# Patient Record
Sex: Male | Born: 1955 | Race: White | Hispanic: No | Marital: Married | Smoking: Never smoker
Health system: Southern US, Community
[De-identification: ages and names within clinical notes are randomized; demographics above are authoritative.]

## PROBLEM LIST (undated history)

## (undated) DIAGNOSIS — T7840XA Allergy, unspecified, initial encounter: Secondary | ICD-10-CM

## (undated) DIAGNOSIS — I493 Ventricular premature depolarization: Secondary | ICD-10-CM

## (undated) DIAGNOSIS — E785 Hyperlipidemia, unspecified: Secondary | ICD-10-CM

## (undated) DIAGNOSIS — E7800 Pure hypercholesterolemia, unspecified: Secondary | ICD-10-CM

## (undated) DIAGNOSIS — G4730 Sleep apnea, unspecified: Secondary | ICD-10-CM

## (undated) DIAGNOSIS — E559 Vitamin D deficiency, unspecified: Secondary | ICD-10-CM

## (undated) DIAGNOSIS — I10 Essential (primary) hypertension: Secondary | ICD-10-CM

## (undated) DIAGNOSIS — K219 Gastro-esophageal reflux disease without esophagitis: Secondary | ICD-10-CM

## (undated) DIAGNOSIS — R011 Cardiac murmur, unspecified: Secondary | ICD-10-CM

## (undated) DIAGNOSIS — G629 Polyneuropathy, unspecified: Secondary | ICD-10-CM

## (undated) DIAGNOSIS — G4733 Obstructive sleep apnea (adult) (pediatric): Secondary | ICD-10-CM

## (undated) DIAGNOSIS — M79606 Pain in leg, unspecified: Secondary | ICD-10-CM

## (undated) DIAGNOSIS — G709 Myoneural disorder, unspecified: Secondary | ICD-10-CM

## (undated) DIAGNOSIS — M2550 Pain in unspecified joint: Secondary | ICD-10-CM

## (undated) DIAGNOSIS — R0602 Shortness of breath: Secondary | ICD-10-CM

## (undated) HISTORY — PX: TONSILLECTOMY: SUR1361

## (undated) HISTORY — DX: Cardiac murmur, unspecified: R01.1

## (undated) HISTORY — DX: Shortness of breath: R06.02

## (undated) HISTORY — DX: Hyperlipidemia, unspecified: E78.5

## (undated) HISTORY — DX: Essential (primary) hypertension: I10

## (undated) HISTORY — DX: Obstructive sleep apnea (adult) (pediatric): G47.33

## (undated) HISTORY — PX: VASECTOMY: SHX75

## (undated) HISTORY — DX: Sleep apnea, unspecified: G47.30

## (undated) HISTORY — DX: Ventricular premature depolarization: I49.3

## (undated) HISTORY — DX: Myoneural disorder, unspecified: G70.9

## (undated) HISTORY — DX: Polyneuropathy, unspecified: G62.9

## (undated) HISTORY — PX: OTHER SURGICAL HISTORY: SHX169

## (undated) HISTORY — DX: Pain in unspecified joint: M25.50

## (undated) HISTORY — DX: Gastro-esophageal reflux disease without esophagitis: K21.9

## (undated) HISTORY — DX: Pure hypercholesterolemia, unspecified: E78.00

## (undated) HISTORY — DX: Allergy, unspecified, initial encounter: T78.40XA

## (undated) HISTORY — DX: Vitamin D deficiency, unspecified: E55.9

## (undated) HISTORY — DX: Pain in leg, unspecified: M79.606

## (undated) HISTORY — PX: SPINE SURGERY: SHX786

## (undated) HISTORY — PX: ROTATOR CUFF REPAIR: SHX139

---

## 2012-03-20 ENCOUNTER — Telehealth: Payer: Self-pay

## 2012-03-20 NOTE — Telephone Encounter (Signed)
.  [facility] The patient requested return call from Dr. Cleta Alberts.  Please call patient at [phone].

## 2012-03-21 NOTE — Telephone Encounter (Signed)
Spoke with patient and he states he spoke with Dr. Cleta Alberts last pm.  Disregard message.

## 2012-04-04 ENCOUNTER — Ambulatory Visit: Payer: BC Managed Care – PPO | Admitting: Emergency Medicine

## 2012-04-04 VITALS — BP 119/73 | HR 75 | Temp 98.8°F | Resp 16 | Ht 70.18 in | Wt 220.0 lb

## 2012-04-04 DIAGNOSIS — J020 Streptococcal pharyngitis: Secondary | ICD-10-CM

## 2012-04-04 MED ORDER — ERYTHROMYCIN BASE 500 MG PO TABS: 500.0000 mg | ORAL_TABLET | Freq: Four times a day (QID) | ORAL | Status: AC

## 2012-04-04 NOTE — Progress Notes (Signed)
  Subjective:    Patient ID: Thomas Harding, male    DOB: April 06, 1956, 56 y.o.   MRN: [id number]  Sore Throat  This is a new problem. The current episode started yesterday. The problem has been unchanged. Neither side of throat is experiencing more pain than the other. The maximum temperature recorded prior to his arrival was 100 - 100.9 F. Associated symptoms include diarrhea. Pertinent negatives include no abdominal pain, congestion, coughing, drooling, ear pain, headaches, hoarse voice, plugged ear sensation, neck pain, shortness of breath, stridor, swollen glands, trouble swallowing or vomiting. He has tried nothing for the symptoms. The treatment provided no relief.  Diarrhea  Pertinent negatives include no abdominal pain, coughing, headaches or vomiting.      Review of Systems  Constitutional: Negative.   HENT: Negative for ear pain, congestion, hoarse voice, drooling, trouble swallowing and neck pain.   Eyes: Negative.   Respiratory: Negative for cough, shortness of breath and stridor.   Cardiovascular: Negative.   Gastrointestinal: Positive for diarrhea. Negative for vomiting and abdominal pain.  Genitourinary: Negative.   Musculoskeletal: Negative.   Neurological: Negative for headaches.       Objective:   Physical Exam  Constitutional: He is oriented to person, place, and time. He appears well-developed and well-nourished.  HENT:  Head: Normocephalic and atraumatic.  Mouth/Throat: Oropharyngeal exudate present.  Eyes: Conjunctivae are normal. Pupils are equal, round, and reactive to light.  Neck: Normal range of motion. Neck supple.  Cardiovascular: Normal rate and regular rhythm.   Pulmonary/Chest: Effort normal and breath sounds normal.  Abdominal: Soft.  Musculoskeletal: Normal range of motion.  Neurological: He is alert and oriented to person, place, and time.  Skin: Skin is warm and dry.          Assessment & Plan:  Strep Follow up as needed

## 2014-04-02 ENCOUNTER — Telehealth: Payer: Self-pay

## 2014-04-02 NOTE — Telephone Encounter (Signed)
Patient calling to get a prescription from [facility]. He meant Dr. Everlene Farrier. He says he believes he needs an antibiotic for an infected tooth. Please advise.   Best: [phone]

## 2014-04-03 NOTE — Telephone Encounter (Signed)
Patient called because of an abscessed tooth. He is allergic to penicillin we'll treat with clindamycin 300 mg 3 times a day until he can get into see the dentist next week. Prescription called to [facility] at [facility].

## 2014-04-04 NOTE — Telephone Encounter (Signed)
Spoke to patient and advised him that his rx was called into [facility] at [address].  Pt verbalized understanding.

## 2016-08-05 ENCOUNTER — Ambulatory Visit (INDEPENDENT_AMBULATORY_CARE_PROVIDER_SITE_OTHER): Payer: BLUE CROSS/BLUE SHIELD

## 2016-08-05 ENCOUNTER — Ambulatory Visit (INDEPENDENT_AMBULATORY_CARE_PROVIDER_SITE_OTHER): Payer: BLUE CROSS/BLUE SHIELD | Admitting: Family Medicine

## 2016-08-05 ENCOUNTER — Encounter: Payer: Self-pay | Admitting: Family Medicine

## 2016-08-05 VITALS — BP 130/80 | HR 78 | Temp 98.2°F | Resp 16 | Ht 71.0 in | Wt 225.0 lb

## 2016-08-05 DIAGNOSIS — R0789 Other chest pain: Secondary | ICD-10-CM | POA: Diagnosis not present

## 2016-08-05 DIAGNOSIS — Z131 Encounter for screening for diabetes mellitus: Secondary | ICD-10-CM | POA: Diagnosis not present

## 2016-08-05 DIAGNOSIS — Z13220 Encounter for screening for lipoid disorders: Secondary | ICD-10-CM | POA: Diagnosis not present

## 2016-08-05 DIAGNOSIS — Z8249 Family history of ischemic heart disease and other diseases of the circulatory system: Secondary | ICD-10-CM | POA: Diagnosis not present

## 2016-08-05 DIAGNOSIS — Z1329 Encounter for screening for other suspected endocrine disorder: Secondary | ICD-10-CM

## 2016-08-05 DIAGNOSIS — R0609 Other forms of dyspnea: Secondary | ICD-10-CM | POA: Diagnosis not present

## 2016-08-05 DIAGNOSIS — R0600 Dyspnea, unspecified: Secondary | ICD-10-CM

## 2016-08-05 LAB — POCT CBC
Granulocyte percent: 67.6 %G (ref 37–80)
HCT, POC: 41.8 % — AB (ref 43.5–53.7)
Hemoglobin: 14.9 g/dL (ref 14.1–18.1)
Lymph, poc: 2.1 (ref 0.6–3.4)
MCH, POC: 29.8 pg (ref 27–31.2)
MCHC: 35.7 g/dL — AB (ref 31.8–35.4)
MCV: 83.4 fL (ref 80–97)
MID (cbc): 0.3 (ref 0–0.9)
MPV: 7.9 fL (ref 0–99.8)
POC Granulocyte: 5 (ref 2–6.9)
POC LYMPH PERCENT: 29 %L (ref 10–50)
POC MID %: 3.4 %M (ref 0–12)
Platelet Count, POC: 232 10*3/uL (ref 142–424)
RBC: 5.01 M/uL (ref 4.69–6.13)
RDW, POC: 12.4 %
WBC: 7.4 10*3/uL (ref 4.6–10.2)

## 2016-08-05 NOTE — Patient Instructions (Addendum)
Your blood tests, EKG, and chest x-ray overall looked okay here in the office. I will send off some other blood work to look at thyroid and blood clot tests, although you do not appear to have any significant risk factors at this time.   However based on your symptoms, would recommend you be seen and evaluated by cardiology for possible underlying heart disease. In the meantime, start aspirin 81 mg each day, avoid any exertional activity activity that had caused chest tightness or shortness of breath is in the past. If any further chest pain or worsening chest pain, go to emergency room or call 911. I will refer you to cardiologist.     Nonspecific Chest Pain  Chest pain can be caused by many different conditions. There is always a chance that your pain could be related to something serious, such as a heart attack or a blood clot in your lungs. Chest pain can also be caused by conditions that are not life-threatening. If you have chest pain, it is very important to follow up with your health care provider. CAUSES  Chest pain can be caused by:  Heartburn.  Pneumonia or bronchitis.  Anxiety or stress.  Inflammation around your heart (pericarditis) or lung (pleuritis or pleurisy).  A blood clot in your lung.  A collapsed lung (pneumothorax). It can develop suddenly on its own (spontaneous pneumothorax) or from trauma to the chest.  Shingles infection (varicella-zoster virus).  Heart attack.  Damage to the bones, muscles, and cartilage that make up your chest wall. This can include:  Bruised bones due to injury.  Strained muscles or cartilage due to frequent or repeated coughing or overwork.  Fracture to one or more ribs.  Sore cartilage due to inflammation (costochondritis). RISK FACTORS  Risk factors for chest pain may include:  Activities that increase your risk for trauma or injury to your chest.  Respiratory infections or conditions that cause frequent coughing.  Medical  conditions or overeating that can cause heartburn.  Heart disease or family history of heart disease.  Conditions or health behaviors that increase your risk of developing a blood clot.  Having had chicken pox (varicella zoster). SIGNS AND SYMPTOMS Chest pain can feel like:  Burning or tingling on the surface of your chest or deep in your chest.  Crushing, pressure, aching, or squeezing pain.  Dull or sharp pain that is worse when you move, cough, or take a deep breath.  Pain that is also felt in your back, neck, shoulder, or arm, or pain that spreads to any of these areas. Your chest pain may come and go, or it may stay constant. DIAGNOSIS Lab tests or other studies may be needed to find the cause of your pain. Your health care provider may have you take a test called an ambulatory ECG (electrocardiogram). An ECG records your heartbeat patterns at the time the test is performed. You may also have other tests, such as:  Transthoracic echocardiogram (TTE). During echocardiography, sound waves are used to create a picture of all of the heart structures and to look at how blood flows through your heart.  Transesophageal echocardiogram (TEE).This is a more advanced imaging test that obtains images from inside your body. It allows your health care provider to see your heart in finer detail.  Cardiac monitoring. This allows your health care provider to monitor your heart rate and rhythm in real time.  Holter monitor. This is a portable device that records your heartbeat and can help to  diagnose abnormal heartbeats. It allows your health care provider to track your heart activity for several days, if needed.  Stress tests. These can be done through exercise or by taking medicine that makes your heart beat more quickly.  Blood tests.  Imaging tests. TREATMENT  Your treatment depends on what is causing your chest pain. Treatment may include:  Medicines. These may include:  Acid  blockers for heartburn.  Anti-inflammatory medicine.  Pain medicine for inflammatory conditions.  Antibiotic medicine, if an infection is present.  Medicines to dissolve blood clots.  Medicines to treat coronary artery disease.  Supportive care for conditions that do not require medicines. This may include:  Resting.  Applying heat or cold packs to injured areas.  Limiting activities until pain decreases. HOME CARE INSTRUCTIONS  If you were prescribed an antibiotic medicine, finish it all even if you start to feel better.  Avoid any activities that bring on chest pain.  Do not use any tobacco products, including cigarettes, chewing tobacco, or electronic cigarettes. If you need help quitting, ask your health care provider.  Do not drink alcohol.  Take medicines only as directed by your health care provider.  Keep all follow-up visits as directed by your health care provider. This is important. This includes any further testing if your chest pain does not go away.  If heartburn is the cause for your chest pain, you may be told to keep your head raised (elevated) while sleeping. This reduces the chance that acid will go from your stomach into your esophagus.  Make lifestyle changes as directed by your health care provider. These may include:  Getting regular exercise. Ask your health care provider to suggest some activities that are safe for you.  Eating a heart-healthy diet. A registered dietitian can help you to learn healthy eating options.  Maintaining a healthy weight.  Managing diabetes, if necessary.  Reducing stress. SEEK MEDICAL CARE IF:  Your chest pain does not go away after treatment.  You have a rash with blisters on your chest.  You have a fever. SEEK IMMEDIATE MEDICAL CARE IF:   Your chest pain is worse.  You have an increasing cough, or you cough up blood.  You have severe abdominal pain.  You have severe weakness.  You faint.  You have  chills.  You have sudden, unexplained chest discomfort.  You have sudden, unexplained discomfort in your arms, back, neck, or jaw.  You have shortness of breath at any time.  You suddenly start to sweat, or your skin gets clammy.  You feel nauseous or you vomit.  You suddenly feel light-headed or dizzy.  Your heart begins to beat quickly, or it feels like it is skipping beats. These symptoms may represent a serious problem that is an emergency. Do not wait to see if the symptoms will go away. Get medical help right away. Call your local emergency services (911 in the [place] Do not drive yourself to the hospital.   This information is not intended to replace advice given to you by your health care provider. Make sure you discuss any questions you have with your health care provider.   Document Released: 07/10/2005 Document Revised: 10/21/2014 Document Reviewed: 05/06/2014 Elsevier Interactive Patient Education 2016 [employer].    IF you received an x-ray today, you will receive an invoice from [facility]. Please contact [facility] at [phone] with questions or concerns regarding your invoice.   IF you received labwork today, you will receive an invoice  from [employer]. Please contact [facility] at [phone] with questions or concerns regarding your invoice.   Our billing staff will not be able to assist you with questions regarding bills from these companies.  You will be contacted with the lab results as soon as they are available. The fastest way to get your results is to activate your My Chart account. Instructions are located on the last page of this paperwork. If you have not heard from [place] regarding the results in 2 weeks, please contact this office.

## 2016-08-05 NOTE — Progress Notes (Signed)
Subjective:  By signing my name below, I, Moises Blood, attest that this documentation has been prepared under the direction and in the presence of Merri Ray, MD. Electronically Signed: Moises Blood, D[place]. 08/05/2016 , 2:51 PM .  Patient was seen in Room 13 .   Patient ID: Thomas Harding, male    DOB: 1956/01/26, 60 y.o.   MRN: [id number] Chief Complaint  Patient presents with  . Shortness of Breath    x 2 weeks, pt. says when walking up hill and bending down   . Neck Pain    x 2 weeks, pt say usually in morning   HPI Thomas Harding is a 60 y.o. male Here for 2 separate concerns: 2 weeks of shortness of breath and 2 weeks of neck pain. He had peanut butter pretzels about an hour prior to office visit. He works in Merchandiser, retail.   Shortness of breath He's been bending down and getting up for work and feeling a little short of breath with chest tightness. Over the past weekend, he noticed more shortness of breath when walking up a hill while walking his dogs. His short of breath improve with rest. His chest tightness improve as the day goes on. He denies any unexpected weight change. He denies illicit drug use. He denies using an inhaler or a puffer for improvement. He denies heart problems or heart attack in the past. He denies having stress test done.   Family history He notes this father having high cholesterol, around age of 39~59 years old.  His paternal grandfather died of heart attack in his early 64s. His uncles have heart problems.   He has 2 younger brothers, and they're both healthy.   Leg Pain He also complains having intermittent left upper leg pain. He denies any calf pain. He denies any recent long distance travel. He also notes numbness in his right foot, but this has been ongoing for a long time.   Neck pain He also notes having neck tightness with occasional throbbing pain.   There are no active problems to display for this patient.  No past  medical history on file. No past surgical history on file. Allergies  Allergen Reactions  . Bee Venom Anaphylaxis  . Penicillins Hives and Swelling   Prior to Admission medications   Not on File   Social History   Social History  . Marital status: Single    Spouse name: N/A  . Number of children: N/A  . Years of education: N/A   Occupational History  . Not on file.   Social History Main Topics  . Smoking status: Never Smoker  . Smokeless tobacco: Never Used  . Alcohol use Not on file  . Drug use: No  . Sexual activity: Not on file   Other Topics Concern  . Not on file   Social History Narrative  . No narrative on file   Review of Systems  Constitutional: Negative for activity change and unexpected weight change.  Respiratory: Positive for chest tightness and shortness of breath. Negative for cough and wheezing.   Cardiovascular: Negative for chest pain and leg swelling.  Musculoskeletal: Positive for myalgias, neck pain and neck stiffness. Negative for joint swelling.  Neurological: Positive for numbness. Negative for dizziness and headaches.       Objective:   Physical Exam  Constitutional: He is oriented to person, place, and time. He appears well-developed and well-nourished. No distress.  HENT:  Head: Normocephalic and atraumatic.  Eyes: EOM are normal. Pupils are equal, round, and reactive to light.  Neck: Neck supple.  C-spine: Limited rotation and extension  Cardiovascular: Normal rate.   Pulmonary/Chest: Effort normal. No respiratory distress.  Musculoskeletal: Normal range of motion.  Full strength and grip strength in upper extremities 5/5  Neurological: He is alert and oriented to person, place, and time.  Reflex Scores:      Tricep reflexes are 1+ on the right side and 1+ on the left side.      Bicep reflexes are 2+ on the right side and 2+ on the left side.      Brachioradialis reflexes are 2+ on the right side and 2+ on the left side. Skin:  Skin is warm and dry.  Psychiatric: He has a normal mood and affect. His behavior is normal.  Nursing note and vitals reviewed.   Vitals:   08/05/16 1419  BP: 130/80  Pulse: 78  Resp: 16  Temp: 98.2 F (36.8 C)  TempSrc: Oral  SpO2: 97%  Weight: 225 lb (102.1 kg)  Height: 5\' 11"  (1.803 m)   Results for orders placed or performed in visit on 08/05/16  POCT CBC  Result Value Ref Range   WBC 7.4 4.6 - 10.2 K/uL   Lymph, poc 2.1 0.6 - 3.4   POC LYMPH PERCENT 29.0 10 - 50 %L   MID (cbc) 0.3 0 - 0.9   POC MID % 3.4 0 - 12 %M   POC Granulocyte 5.0 2 - 6.9   Granulocyte percent 67.6 37 - 80 %G   RBC 5.01 4.69 - 6.13 M/uL   Hemoglobin 14.9 14.1 - 18.1 g/dL   HCT, POC 41.8 (A) 43.5 - 53.7 %   MCV 83.4 80 - 97 fL   MCH, POC 29.8 27 - 31.2 pg   MCHC 35.7 (A) 31.8 - 35.4 g/dL   RDW, POC 12.4 %   Platelet Count, POC 232 142 - 424 K/uL   MPV 7.9 0 - 99.8 fL   EKG: sinus rhythm, no acute findings Results for orders placed or performed in visit on 08/05/16  COMPLETE METABOLIC PANEL WITH GFR  Result Value Ref Range   Sodium 140 135 - 146 mmol/L   Potassium 4.2 3.5 - 5.3 mmol/L   Chloride 102 98 - 110 mmol/L   CO2 30 20 - 31 mmol/L   Glucose, Bld 89 65 - 99 mg/dL   BUN 14 7 - 25 mg/dL   Creat 1.18 0.70 - 1.33 mg/dL   Total Bilirubin 0.8 0.2 - 1.2 mg/dL   Alkaline Phosphatase 73 40 - 115 U/L   AST 21 10 - 35 U/L   ALT 20 9 - 46 U/L   Total Protein 7.0 6.1 - 8.1 g/dL   Albumin 4.1 3.6 - 5.1 g/dL   Calcium 9.6 8.6 - 10.3 mg/dL   GFR, Est African American 78 >=60 mL/min   GFR, Est Non African American 67 >=60 mL/min  Lipid panel  Result Value Ref Range   Cholesterol 183 125 - 200 mg/dL   Triglycerides 308 (H) <150 mg/dL   HDL 31 (L) >=40 mg/dL   Total CHOL/HDL Ratio 5.9 (H) <=5.0 Ratio   VLDL 62 (H) <30 mg/dL   LDL Cholesterol 90 <130 mg/dL  TSH  Result Value Ref Range   TSH 2.30 0.40 - 4.50 mIU/L  D-dimer, quantitative (not at [facility])  Result Value Ref Range   D-Dimer,  Quant 0.36 <0.50 mcg/mL FEU  POCT CBC  Result Value Ref Range   WBC 7.4 4.6 - 10.2 K/uL   Lymph, poc 2.1 0.6 - 3.4   POC LYMPH PERCENT 29.0 10 - 50 %L   MID (cbc) 0.3 0 - 0.9   POC MID % 3.4 0 - 12 %M   POC Granulocyte 5.0 2 - 6.9   Granulocyte percent 67.6 37 - 80 %G   RBC 5.01 4.69 - 6.13 M/uL   Hemoglobin 14.9 14.1 - 18.1 g/dL   HCT, POC 41.8 (A) 43.5 - 53.7 %   MCV 83.4 80 - 97 fL   MCH, POC 29.8 27 - 31.2 pg   MCHC 35.7 (A) 31.8 - 35.4 g/dL   RDW, POC 12.4 %   Platelet Count, POC 232 142 - 424 K/uL   MPV 7.9 0 - 99.8 fL       Assessment & Plan:    ARWIN HAAG is a 60 y.o. male Dyspnea on exertion - Plan: DG Chest 2 View, EKG 12-Lead, POCT CBC, D-dimer, quantitative (not at [facility]), Ambulatory referral to Cardiology Family history of early CAD - Plan: DG Chest 2 View, EKG 12-Lead Chest tightness - Plan: DG Chest 2 View, EKG 12-Lead, TSH, D-dimer, quantitative (not at [facility]), Ambulatory referral to Cardiology   -Dyspnea on exertion with episodic chest tightness. No concerning findings on EKG or chest x-ray in office, but also asymptomatic currently. With family history of coronary disease, will refer to cardiology for possible stress testing and echo as angina possible with his symptoms above. For now start aspirin 81 mg daily, and avoid exertional activity until he has been cleared to do so by cardiology. ER/911 chest pain precautions were reviewed. D-dimer was obtained that was negative, no known symptoms or risk factors for DVT/PE.  Screening for diabetes mellitus - Plan: COMPLETE METABOLIC PANEL WITH GFR  Screening for hyperlipidemia - Plan: Lipid panel to risk stratify.   Screening for thyroid disorder - Plan: TSH   No orders of the defined types were placed in this encounter.  Patient Instructions   Your blood tests, EKG, and chest x-ray overall looked okay here in the office. I will send off some other blood work to look at thyroid and blood clot tests, although  you do not appear to have any significant risk factors at this time.   However based on your symptoms, would recommend you be seen and evaluated by cardiology for possible underlying heart disease. In the meantime, start aspirin 81 mg each day, avoid any exertional activity activity that had caused chest tightness or shortness of breath is in the past. If any further chest pain or worsening chest pain, go to emergency room or call 911. I will refer you to cardiologist.     Nonspecific Chest Pain  Chest pain can be caused by many different conditions. There is always a chance that your pain could be related to something serious, such as a heart attack or a blood clot in your lungs. Chest pain can also be caused by conditions that are not life-threatening. If you have chest pain, it is very important to follow up with your health care provider. CAUSES  Chest pain can be caused by:  Heartburn.  Pneumonia or bronchitis.  Anxiety or stress.  Inflammation around your heart (pericarditis) or lung (pleuritis or pleurisy).  A blood clot in your lung.  A collapsed lung (pneumothorax). It can develop suddenly on its own (spontaneous pneumothorax) or from trauma to the chest.  Shingles infection (varicella-zoster  virus).  Heart attack.  Damage to the bones, muscles, and cartilage that make up your chest wall. This can include:  Bruised bones due to injury.  Strained muscles or cartilage due to frequent or repeated coughing or overwork.  Fracture to one or more ribs.  Sore cartilage due to inflammation (costochondritis). RISK FACTORS  Risk factors for chest pain may include:  Activities that increase your risk for trauma or injury to your chest.  Respiratory infections or conditions that cause frequent coughing.  Medical conditions or overeating that can cause heartburn.  Heart disease or family history of heart disease.  Conditions or health behaviors that increase your risk of  developing a blood clot.  Having had chicken pox (varicella zoster). SIGNS AND SYMPTOMS Chest pain can feel like:  Burning or tingling on the surface of your chest or deep in your chest.  Crushing, pressure, aching, or squeezing pain.  Dull or sharp pain that is worse when you move, cough, or take a deep breath.  Pain that is also felt in your back, neck, shoulder, or arm, or pain that spreads to any of these areas. Your chest pain may come and go, or it may stay constant. DIAGNOSIS Lab tests or other studies may be needed to find the cause of your pain. Your health care provider may have you take a test called an ambulatory ECG (electrocardiogram). An ECG records your heartbeat patterns at the time the test is performed. You may also have other tests, such as:  Transthoracic echocardiogram (TTE). During echocardiography, sound waves are used to create a picture of all of the heart structures and to look at how blood flows through your heart.  Transesophageal echocardiogram (TEE).This is a more advanced imaging test that obtains images from inside your body. It allows your health care provider to see your heart in finer detail.  Cardiac monitoring. This allows your health care provider to monitor your heart rate and rhythm in real time.  Holter monitor. This is a portable device that records your heartbeat and can help to diagnose abnormal heartbeats. It allows your health care provider to track your heart activity for several days, if needed.  Stress tests. These can be done through exercise or by taking medicine that makes your heart beat more quickly.  Blood tests.  Imaging tests. TREATMENT  Your treatment depends on what is causing your chest pain. Treatment may include:  Medicines. These may include:  Acid blockers for heartburn.  Anti-inflammatory medicine.  Pain medicine for inflammatory conditions.  Antibiotic medicine, if an infection is present.  Medicines to  dissolve blood clots.  Medicines to treat coronary artery disease.  Supportive care for conditions that do not require medicines. This may include:  Resting.  Applying heat or cold packs to injured areas.  Limiting activities until pain decreases. HOME CARE INSTRUCTIONS  If you were prescribed an antibiotic medicine, finish it all even if you start to feel better.  Avoid any activities that bring on chest pain.  Do not use any tobacco products, including cigarettes, chewing tobacco, or electronic cigarettes. If you need help quitting, ask your health care provider.  Do not drink alcohol.  Take medicines only as directed by your health care provider.  Keep all follow-up visits as directed by your health care provider. This is important. This includes any further testing if your chest pain does not go away.  If heartburn is the cause for your chest pain, you may be told to keep your  head raised (elevated) while sleeping. This reduces the chance that acid will go from your stomach into your esophagus.  Make lifestyle changes as directed by your health care provider. These may include:  Getting regular exercise. Ask your health care provider to suggest some activities that are safe for you.  Eating a heart-healthy diet. A registered dietitian can help you to learn healthy eating options.  Maintaining a healthy weight.  Managing diabetes, if necessary.  Reducing stress. SEEK MEDICAL CARE IF:  Your chest pain does not go away after treatment.  You have a rash with blisters on your chest.  You have a fever. SEEK IMMEDIATE MEDICAL CARE IF:   Your chest pain is worse.  You have an increasing cough, or you cough up blood.  You have severe abdominal pain.  You have severe weakness.  You faint.  You have chills.  You have sudden, unexplained chest discomfort.  You have sudden, unexplained discomfort in your arms, back, neck, or jaw.  You have shortness of breath at  any time.  You suddenly start to sweat, or your skin gets clammy.  You feel nauseous or you vomit.  You suddenly feel light-headed or dizzy.  Your heart begins to beat quickly, or it feels like it is skipping beats. These symptoms may represent a serious problem that is an emergency. Do not wait to see if the symptoms will go away. Get medical help right away. Call your local emergency services (911 in the [place] Do not drive yourself to the hospital.   This information is not intended to replace advice given to you by your health care provider. Make sure you discuss any questions you have with your health care provider.   Document Released: 07/10/2005 Document Revised: 10/21/2014 Document Reviewed: 05/06/2014 Elsevier Interactive Patient Education 2016 [employer].    IF you received an x-ray today, you will receive an invoice from [facility]. Please contact [facility] at [phone] with questions or concerns regarding your invoice.   IF you received labwork today, you will receive an invoice from [employer]. Please contact [facility] at [phone] with questions or concerns regarding your invoice.   Our billing staff will not be able to assist you with questions regarding bills from these companies.  You will be contacted with the lab results as soon as they are available. The fastest way to get your results is to activate your My Chart account. Instructions are located on the last page of this paperwork. If you have not heard from [place] regarding the results in 2 weeks, please contact this office.        I personally performed the services described in this documentation, which was scribed in my presence. The recorded information has been reviewed and considered, and addended by me as needed.   Signed,   Merri Ray, MD [facility].  08/06/16 8:23 AM

## 2016-08-06 LAB — COMPLETE METABOLIC PANEL WITH GFR
ALT: 20 U/L (ref 9–46)
AST: 21 U/L (ref 10–35)
Albumin: 4.1 g/dL (ref 3.6–5.1)
Alkaline Phosphatase: 73 U/L (ref 40–115)
BUN: 14 mg/dL (ref 7–25)
CO2: 30 mmol/L (ref 20–31)
Calcium: 9.6 mg/dL (ref 8.6–10.3)
Chloride: 102 mmol/L (ref 98–110)
Creat: 1.18 mg/dL (ref 0.70–1.33)
GFR, Est African American: 78 mL/min (ref >=60)
GFR, Est Non African American: 67 mL/min (ref >=60)
Glucose, Bld: 89 mg/dL (ref 65–99)
Potassium: 4.2 mmol/L (ref 3.5–5.3)
Sodium: 140 mmol/L (ref 135–146)
Total Bilirubin: 0.8 mg/dL (ref 0.2–1.2)
Total Protein: 7 g/dL (ref 6.1–8.1)

## 2016-08-06 LAB — D-DIMER, QUANTITATIVE: D-Dimer, Quant: 0.36 mcg/mL FEU (ref <0.50)

## 2016-08-06 LAB — LIPID PANEL
Cholesterol: 183 mg/dL (ref 125–200)
HDL: 31 mg/dL — ABNORMAL LOW (ref >=40)
LDL Cholesterol: 90 mg/dL (ref <130)
Total CHOL/HDL Ratio: 5.9 Ratio — ABNORMAL HIGH (ref <=5.0)
Triglycerides: 308 mg/dL — ABNORMAL HIGH (ref <150)
VLDL: 62 mg/dL — ABNORMAL HIGH (ref <30)

## 2016-08-06 LAB — TSH: TSH: 2.3 mIU/L (ref 0.40–4.50)

## 2016-08-19 DIAGNOSIS — E782 Mixed hyperlipidemia: Secondary | ICD-10-CM | POA: Diagnosis not present

## 2016-08-19 DIAGNOSIS — R0602 Shortness of breath: Secondary | ICD-10-CM | POA: Diagnosis not present

## 2016-09-02 DIAGNOSIS — R0789 Other chest pain: Secondary | ICD-10-CM | POA: Diagnosis not present

## 2016-09-02 DIAGNOSIS — R0602 Shortness of breath: Secondary | ICD-10-CM | POA: Diagnosis not present

## 2016-09-12 ENCOUNTER — Encounter: Payer: Self-pay | Admitting: Family Medicine

## 2016-09-12 DIAGNOSIS — R0602 Shortness of breath: Secondary | ICD-10-CM | POA: Diagnosis not present

## 2016-09-12 DIAGNOSIS — R0789 Other chest pain: Secondary | ICD-10-CM | POA: Diagnosis not present

## 2016-10-23 DIAGNOSIS — E782 Mixed hyperlipidemia: Secondary | ICD-10-CM | POA: Diagnosis not present

## 2016-10-28 DIAGNOSIS — E782 Mixed hyperlipidemia: Secondary | ICD-10-CM | POA: Diagnosis not present

## 2016-10-28 DIAGNOSIS — R0602 Shortness of breath: Secondary | ICD-10-CM | POA: Diagnosis not present

## 2016-11-22 DIAGNOSIS — R0600 Dyspnea, unspecified: Secondary | ICD-10-CM | POA: Insufficient documentation

## 2016-11-22 DIAGNOSIS — R0602 Shortness of breath: Secondary | ICD-10-CM

## 2016-11-22 DIAGNOSIS — R0609 Other forms of dyspnea: Secondary | ICD-10-CM | POA: Insufficient documentation

## 2016-12-16 DIAGNOSIS — D179 Benign lipomatous neoplasm, unspecified: Secondary | ICD-10-CM | POA: Diagnosis not present

## 2016-12-16 DIAGNOSIS — L570 Actinic keratosis: Secondary | ICD-10-CM | POA: Diagnosis not present

## 2016-12-16 DIAGNOSIS — D229 Melanocytic nevi, unspecified: Secondary | ICD-10-CM | POA: Diagnosis not present

## 2016-12-16 DIAGNOSIS — D044 Carcinoma in situ of skin of scalp and neck: Secondary | ICD-10-CM | POA: Diagnosis not present

## 2017-01-16 DIAGNOSIS — D044 Carcinoma in situ of skin of scalp and neck: Secondary | ICD-10-CM | POA: Diagnosis not present

## 2017-01-16 DIAGNOSIS — C4492 Squamous cell carcinoma of skin, unspecified: Secondary | ICD-10-CM

## 2017-01-16 HISTORY — DX: Squamous cell carcinoma of skin, unspecified: C44.92

## 2017-03-15 ENCOUNTER — Ambulatory Visit (INDEPENDENT_AMBULATORY_CARE_PROVIDER_SITE_OTHER): Payer: BLUE CROSS/BLUE SHIELD | Admitting: Family Medicine

## 2017-03-15 ENCOUNTER — Encounter: Payer: Self-pay | Admitting: Family Medicine

## 2017-03-15 VITALS — BP 126/76 | HR 80 | Temp 98.6°F | Resp 16 | Ht 70.25 in | Wt 221.2 lb

## 2017-03-15 DIAGNOSIS — L03114 Cellulitis of left upper limb: Secondary | ICD-10-CM

## 2017-03-15 DIAGNOSIS — E7800 Pure hypercholesterolemia, unspecified: Secondary | ICD-10-CM

## 2017-03-15 DIAGNOSIS — M25522 Pain in left elbow: Secondary | ICD-10-CM

## 2017-03-15 DIAGNOSIS — Z23 Encounter for immunization: Secondary | ICD-10-CM | POA: Diagnosis not present

## 2017-03-15 DIAGNOSIS — S50312A Abrasion of left elbow, initial encounter: Secondary | ICD-10-CM

## 2017-03-15 LAB — POCT CBC
Granulocyte percent: 77 %G (ref 37–80)
HCT, POC: 40.9 % — AB (ref 43.5–53.7)
Hemoglobin: 14.5 g/dL (ref 14.1–18.1)
Lymph, poc: 2.1 (ref 0.6–3.4)
MCH, POC: 29.6 pg (ref 27–31.2)
MCHC: 35.4 g/dL (ref 31.8–35.4)
MCV: 83.4 fL (ref 80–97)
MID (cbc): 0.3 (ref 0–0.9)
MPV: 8.3 fL (ref 0–99.8)
POC Granulocyte: 8 — AB (ref 2–6.9)
POC LYMPH PERCENT: 20.4 %L (ref 10–50)
POC MID %: 2.6 %M (ref 0–12)
Platelet Count, POC: 203 10*3/uL (ref 142–424)
RBC: 4.9 M/uL (ref 4.69–6.13)
RDW, POC: 12.4 %
WBC: 10.4 10*3/uL — AB (ref 4.6–10.2)

## 2017-03-15 MED ORDER — SULFAMETHOXAZOLE-TRIMETHOPRIM 800-160 MG PO TABS
2.0000 | ORAL_TABLET | Freq: Two times a day (BID) | ORAL | 0 refills | Status: DC
Start: 2017-03-15 — End: 2018-06-08

## 2017-03-15 MED ORDER — ATORVASTATIN CALCIUM 10 MG PO TABS: 10.0000 mg | ORAL_TABLET | Freq: Every day | ORAL | 0 refills | Status: DC

## 2017-03-15 MED ORDER — LEVOFLOXACIN 500 MG PO TABS: 500.0000 mg | ORAL_TABLET | Freq: Every day | ORAL | 0 refills | Status: DC

## 2017-03-15 NOTE — Progress Notes (Signed)
Subjective:    Patient ID: Thomas Harding, male    DOB: [birth year], 61 y.o.   MRN: [id number]  03/15/2017  Elbow Pain (left, blister while working ) and Hyperlipidemia (recheck )   HPI This 61 y.o. male presents for evaluation of LEFT ELBOW PAIN.  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  Rubbed elbow on flooring all day long and suffered a superficial scrape along L ELBOW.  One week ago, got a blister on L elbow and L knee. Yesterday, coming home and flexed arm and hurt; also hurt to lay arm on arm rest.  No fever.  Not sure when developed redness.  +Swelling and warmth at L elbow and forearm.  Denies numbness and tingling in forearm or fingers.  No drainage.  Not sure when last Tetanus occurred. Son is a Software engineer.    Also requesting refill of Atorvastatin.  Started by Dr. Einar Gip based on strong family history of coronary artery disease.  Nonfasting today.  Having some muscle soreness with medication.   Review of Systems  Constitutional: Negative for activity change, appetite change, chills, diaphoresis, fatigue and fever.  Eyes: Negative for visual disturbance.  Respiratory: Negative for cough and shortness of breath.   Cardiovascular: Negative for chest pain, palpitations and leg swelling.  Endocrine: Negative for cold intolerance, heat intolerance, polydipsia, polyphagia and polyuria.  Musculoskeletal: Positive for joint swelling.  Skin: Positive for color change and wound. Negative for pallor and rash.  Neurological: Negative for dizziness, tremors, seizures, syncope, facial asymmetry, speech difficulty, weakness, light-headedness, numbness and headaches.    Past Medical History:  Diagnosis Date  . Hyperlipidemia    History reviewed. No pertinent surgical history. Allergies  Allergen Reactions  . Bee Venom Anaphylaxis  . Penicillins Hives and Swelling    Social History   Social History  . Marital status: Single    Spouse name:  N/A  . Number of children: N/A  . Years of education: N/A   Occupational History  . Not on file.   Social History Main Topics  . Smoking status: Never Smoker  . Smokeless tobacco: Never Used  . Alcohol use Yes     Comment: 2 per month  . Drug use: No  . Sexual activity: Yes   Other Topics Concern  . Not on file   Social History Narrative  . No narrative on file   History reviewed. No pertinent family history.     Objective:    BP 126/76   Pulse 80   Temp 98.6 F (37 C) (Oral)   Resp 16   Ht 5' 10.25" (1.784 m)   Wt 221 lb 4 oz (100.4 kg)   SpO2 97%   BMI 31.52 kg/m  Physical Exam  Constitutional: He is oriented to person, place, and time. He appears well-developed and well-nourished. No distress.  HENT:  Head: Normocephalic and atraumatic.  Eyes: Conjunctivae and EOM are normal. Pupils are equal, round, and reactive to light.  Neck: Normal range of motion. Neck supple. Carotid bruit is not present.  Cardiovascular: Normal rate, regular rhythm, normal heart sounds and intact distal pulses.  Exam reveals no gallop and no friction rub.   No murmur heard. Pulmonary/Chest: Effort normal and breath sounds normal. He has no wheezes. He has no rales.  Musculoskeletal:       Left elbow: He exhibits swelling. He exhibits normal range of motion, no effusion, no deformity and no laceration. No tenderness found. No radial  head, no medial epicondyle, no lateral epicondyle and no olecranon process tenderness noted.       Left wrist: Normal. He exhibits normal range of motion, no tenderness and no bony tenderness.       Left forearm: He exhibits tenderness and swelling. He exhibits no bony tenderness, no deformity and no laceration.       Arms: +1 cm eschar distal to olecranon region of L ELBOW; no fluctuance; no streaking; +large area of surrounding erythema 13cm x 13 cm. +swelling along olecranon region yet non-tender at bursa location.  Full extension and flexion of elbow with  minimal pain; normal pronation and supination of L forearm.  Full ROM L wrist without pain.   Neurological: He is alert and oriented to person, place, and time. No cranial nerve deficit.  Skin: Skin is warm and dry. No rash noted. He is not diaphoretic. There is erythema.  Psychiatric: He has a normal mood and affect. His behavior is normal.  Nursing note and vitals reviewed.  Results for orders placed or performed in visit on 03/15/17  POCT CBC  Result Value Ref Range   WBC 10.4 (A) 4.6 - 10.2 K/uL   Lymph, poc 2.1 0.6 - 3.4   POC LYMPH PERCENT 20.4 10 - 50 %L   MID (cbc) 0.3 0 - 0.9   POC MID % 2.6 0 - 12 %M   POC Granulocyte 8.0 (A) 2 - 6.9   Granulocyte percent 77.0 37 - 80 %G   RBC 4.90 4.69 - 6.13 M/uL   Hemoglobin 14.5 14.1 - 18.1 g/dL   HCT, POC 40.9 (A) 43.5 - 53.7 %   MCV 83.4 80 - 97 fL   MCH, POC 29.6 27 - 31.2 pg   MCHC 35.4 31.8 - 35.4 g/dL   RDW, POC 12.4 %   Platelet Count, POC 203 142 - 424 K/uL   MPV 8.3 0 - 99.8 fL       Assessment & Plan:   1. Left elbow pain   2. Abrasion of left elbow, initial encounter   3. Cellulitis of left elbow   4. Need for Tdap vaccination   5. Pure hypercholesterolemia    -New onset L elbow abrasion with cellulitis/secondary wound infection; no presence of abscess formation. -PCN allergy; thus,treat with Bactrim for MRSA coverage; due to location and concern for early olecranon bursitis, will also add Levaquin for better gram positive coverage.  Close follow-up with Dr. Carlota Raspberry in 48 hours.   -To ED for fever/ > 100.4, increasing redness, increasing swelling, increasing pain. -s/p Tdap due to wound. -refill of Atorvastatin; warrants FLP in upcoming few weeks to months.   Orders Placed This Encounter  Procedures  . Tdap vaccine greater than or equal to 7yo IM  . POCT CBC   Meds ordered this encounter  Medications  . DISCONTD: atorvastatin (LIPITOR) 10 MG tablet    Sig: Take 10 mg by mouth daily.  Marland Kitchen  sulfamethoxazole-trimethoprim (BACTRIM DS,SEPTRA DS) 800-160 MG tablet    Sig: Take 2 tablets by mouth 2 (two) times daily.    Dispense:  40 tablet    Refill:  0  . atorvastatin (LIPITOR) 10 MG tablet    Sig: Take 1 tablet (10 mg total) by mouth daily.    Dispense:  90 tablet    Refill:  0  . levofloxacin (LEVAQUIN) 500 MG tablet    Sig: Take 1 tablet (500 mg total) by mouth daily.    Dispense:  7  tablet    Refill:  0    Return in about 2 days (around 03/17/2017) for recheck elbow infection with Dr. Carlota Raspberry.   Paz Fuentes Elayne Guerin, M.D. [facility] at [facility] previously [facility] [address] [place], [place]  [phone] phone [phone] fax

## 2017-03-15 NOTE — Patient Instructions (Addendum)
Present to the emergency department for fever > 101.0, increasing pain in L arm, increasing redness.   Follow-up with Dr. Carlota Raspberry on Monday.   IF you received an x-ray today, you will receive an invoice from [facility]. Please contact [facility] at [phone] with questions or concerns regarding your invoice.   IF you received labwork today, you will receive an invoice from [place]. Please contact [facility] at [phone] with questions or concerns regarding your invoice.   Our billing staff will not be able to assist you with questions regarding bills from these companies.  You will be contacted with the lab results as soon as they are available. The fastest way to get your results is to activate your My Chart account. Instructions are located on the last page of this paperwork. If you have not heard from [place] regarding the results in 2 weeks, please contact this office.      Cellulitis, Adult Cellulitis is a skin infection. The infected area is usually red and tender. This condition occurs most often in the arms and lower legs. The infection can travel to the muscles, blood, and underlying tissue and become serious. It is very important to get treated for this condition. What are the causes? Cellulitis is caused by bacteria. The bacteria enter through a break in the skin, such as a cut, burn, insect bite, open sore, or crack. What increases the risk? This condition is more likely to occur in people who:  Have a weak defense system (immune system).  Have open wounds on the skin such as cuts, burns, bites, and scrapes. Bacteria can enter the body through these open wounds.  Are older.  Have diabetes.  Have a type of long-lasting (chronic) liver disease (cirrhosis) or kidney disease.  Use IV drugs.  What are the signs or symptoms? Symptoms of this condition include:  Redness, streaking, or spotting on the skin.  Swollen area of the skin.  Tenderness or  pain when an area of the skin is touched.  Warm skin.  Fever.  Chills.  Blisters.  How is this diagnosed? This condition is diagnosed based on a medical history and physical exam. You may also have tests, including:  Blood tests.  Lab tests.  Imaging tests.  How is this treated? Treatment for this condition may include:  Medicines, such as antibiotic medicines or antihistamines.  Supportive care, such as rest and application of cold or warm cloths (cold or warm compresses) to the skin.  Hospital care, if the condition is severe.  The infection usually gets better within 1-2 days of treatment. Follow these instructions at home:  Take over-the-counter and prescription medicines only as told by your health care provider.  If you were prescribed an antibiotic medicine, take it as told by your health care provider. Do not stop taking the antibiotic even if you start to feel better.  Drink enough fluid to keep your urine clear or pale yellow.  Do not touch or rub the infected area.  Raise (elevate) the infected area above the level of your heart while you are sitting or lying down.  Apply warm or cold compresses to the affected area as told by your health care provider.  Keep all follow-up visits as told by your health care provider. This is important. These visits let your health care provider make sure a more serious infection is not developing. Contact a health care provider if:  You have a fever.  Your symptoms do not improve within 1-2  days of starting treatment.  Your bone or joint underneath the infected area becomes painful after the skin has healed.  Your infection returns in the same area or another area.  You notice a swollen bump in the infected area.  You develop new symptoms.  You have a general ill feeling (malaise) with muscle aches and pains. Get help right away if:  Your symptoms get worse.  You feel very sleepy.  You develop vomiting or  diarrhea that persists.  You notice red streaks coming from the infected area.  Your red area gets larger or turns dark in color. This information is not intended to replace advice given to you by your health care provider. Make sure you discuss any questions you have with your health care provider. Document Released: 07/10/2005 Document Revised: 02/08/2016 Document Reviewed: 08/09/2015 Elsevier Interactive Patient Education  2017 [employer].

## 2017-03-17 ENCOUNTER — Encounter: Payer: Self-pay | Admitting: Family Medicine

## 2017-03-17 ENCOUNTER — Ambulatory Visit (INDEPENDENT_AMBULATORY_CARE_PROVIDER_SITE_OTHER): Payer: BLUE CROSS/BLUE SHIELD | Admitting: Family Medicine

## 2017-03-17 VITALS — BP 120/77 | HR 76 | Temp 97.7°F | Resp 16 | Ht 70.25 in | Wt 222.2 lb

## 2017-03-17 DIAGNOSIS — E785 Hyperlipidemia, unspecified: Secondary | ICD-10-CM

## 2017-03-17 DIAGNOSIS — L03114 Cellulitis of left upper limb: Secondary | ICD-10-CM

## 2017-03-17 LAB — POCT CBC
Granulocyte percent: 78.4 %G (ref 37–80)
HCT, POC: 40.4 % — AB (ref 43.5–53.7)
Hemoglobin: 14 g/dL — AB (ref 14.1–18.1)
Lymph, poc: 1.7 (ref 0.6–3.4)
MCH, POC: 29.3 pg (ref 27–31.2)
MCHC: 34.8 g/dL (ref 31.8–35.4)
MCV: 84.2 fL (ref 80–97)
MID (cbc): 0.4 (ref 0–0.9)
MPV: 8.2 fL (ref 0–99.8)
POC Granulocyte: 7.7 — AB (ref 2–6.9)
POC LYMPH PERCENT: 17.5 %L (ref 10–50)
POC MID %: 4.1 %M (ref 0–12)
Platelet Count, POC: 206 10*3/uL (ref 142–424)
RBC: 4.79 M/uL (ref 4.69–6.13)
RDW, POC: 12.6 %
WBC: 9.8 10*3/uL (ref 4.6–10.2)

## 2017-03-17 MED ORDER — ATORVASTATIN CALCIUM 10 MG PO TABS: 10.0000 mg | ORAL_TABLET | Freq: Every day | ORAL | 0 refills | Status: DC

## 2017-03-17 NOTE — Patient Instructions (Addendum)
Although there is some spread of redness outside the one area of your rash, the other area appears to be improving. Your white blood cell count was also improving today. Continue the Levaquin once per day, Septra twice per day, and recheck in 48 hours with Windell Hummingbird, PA-C. . If any further fevers, chills, or worsening symptoms prior to that time, return here sooner or the emergency room if needed.   Cellulitis, Adult Cellulitis is a skin infection. The infected area is usually red and tender. This condition occurs most often in the arms and lower legs. The infection can travel to the muscles, blood, and underlying tissue and become serious. It is very important to get treated for this condition. What are the causes? Cellulitis is caused by bacteria. The bacteria enter through a break in the skin, such as a cut, burn, insect bite, open sore, or crack. What increases the risk? This condition is more likely to occur in people who:  Have a weak defense system (immune system).  Have open wounds on the skin such as cuts, burns, bites, and scrapes. Bacteria can enter the body through these open wounds.  Are older.  Have diabetes.  Have a type of long-lasting (chronic) liver disease (cirrhosis) or kidney disease.  Use IV drugs.  What are the signs or symptoms? Symptoms of this condition include:  Redness, streaking, or spotting on the skin.  Swollen area of the skin.  Tenderness or pain when an area of the skin is touched.  Warm skin.  Fever.  Chills.  Blisters.  How is this diagnosed? This condition is diagnosed based on a medical history and physical exam. You may also have tests, including:  Blood tests.  Lab tests.  Imaging tests.  How is this treated? Treatment for this condition may include:  Medicines, such as antibiotic medicines or antihistamines.  Supportive care, such as rest and application of cold or warm cloths (cold or warm compresses) to the  skin.  Hospital care, if the condition is severe.  The infection usually gets better within 1-2 days of treatment. Follow these instructions at home:  Take over-the-counter and prescription medicines only as told by your health care provider.  If you were prescribed an antibiotic medicine, take it as told by your health care provider. Do not stop taking the antibiotic even if you start to feel better.  Drink enough fluid to keep your urine clear or pale yellow.  Do not touch or rub the infected area.  Raise (elevate) the infected area above the level of your heart while you are sitting or lying down.  Apply warm or cold compresses to the affected area as told by your health care provider.  Keep all follow-up visits as told by your health care provider. This is important. These visits let your health care provider make sure a more serious infection is not developing. Contact a health care provider if:  You have a fever.  Your symptoms do not improve within 1-2 days of starting treatment.  Your bone or joint underneath the infected area becomes painful after the skin has healed.  Your infection returns in the same area or another area.  You notice a swollen bump in the infected area.  You develop new symptoms.  You have a general ill feeling (malaise) with muscle aches and pains. Get help right away if:  Your symptoms get worse.  You feel very sleepy.  You develop vomiting or diarrhea that persists.  You notice red  streaks coming from the infected area.  Your red area gets larger or turns dark in color. This information is not intended to replace advice given to you by your health care provider. Make sure you discuss any questions you have with your health care provider. Document Released: 07/10/2005 Document Revised: 02/08/2016 Document Reviewed: 08/09/2015 Elsevier Interactive Patient Education  2017 [employer].    IF you received an x-ray today, you will  receive an invoice from [facility]. Please contact [facility] at [phone] with questions or concerns regarding your invoice.   IF you received labwork today, you will receive an invoice from [place]. Please contact [facility] at [phone] with questions or concerns regarding your invoice.   Our billing staff will not be able to assist you with questions regarding bills from these companies.  You will be contacted with the lab results as soon as they are available. The fastest way to get your results is to activate your My Chart account. Instructions are located on the last page of this paperwork. If you have not heard from [place] regarding the results in 2 weeks, please contact this office.

## 2017-03-17 NOTE — Progress Notes (Signed)
Subjective:    Patient ID: Thomas Harding, male    DOB: October 15, 1955, 61 y.o.   MRN: [id number]  HPI Thomas Harding is a 61 y.o. male  Here for follow-up of left elbow pain.  He was seen June 2 with new onset left elbow abrasion with a secondary cellulitis, no apparent abscess. He was started on Bactrim for MRSA coverage due to penicillin allergy. Also with some concern of early olecranon bursitis so Levaquin was also started for improved gram-positive coverage. Tdap updated.  Based on last visit erythema measured 13 x 13 cm with 1 cm center eschar. Afebrile with borderline leukocytosis:  Lab Results  Component Value Date   WBC 10.4 (A) 03/15/2017   HGB 14.5 03/15/2017   HCT 40.9 (A) 03/15/2017   MCV 83.4 03/15/2017    Tolerating antibiotics, no new n/v/diarrhea. No known fever, but had chills 2 nights ago - none yesterday or last night.  Redness and swelling improved since yesterday. Skin feels tight at full flexion, but otherwise pain free ROM.    Hyperlipidemia. nonlipitor for 4 months, last po about 8 hours. Legs feel heavy at times, no other new side effects. Tolerated otherwise.   Patient Active Problem List   Diagnosis Date Noted  . Shortness of breath 11/22/2016   Past Medical History:  Diagnosis Date  . Hyperlipidemia    History reviewed. No pertinent surgical history. Allergies  Allergen Reactions  . Bee Venom Anaphylaxis  . Penicillins Hives and Swelling   Prior to Admission medications   Medication Sig Start Date End Date Taking? Authorizing Provider  atorvastatin (LIPITOR) 10 MG tablet Take 1 tablet (10 mg total) by mouth daily. 03/15/17  Yes Wardell Honour, MD  levofloxacin (LEVAQUIN) 500 MG tablet Take 1 tablet (500 mg total) by mouth daily. 03/15/17  Yes Wardell Honour, MD  sulfamethoxazole-trimethoprim (BACTRIM DS,SEPTRA DS) 800-160 MG tablet Take 2 tablets by mouth 2 (two) times daily. 03/15/17  Yes Wardell Honour, MD   Social History   Social History  .  Marital status: Single    Spouse name: N/A  . Number of children: N/A  . Years of education: N/A   Occupational History  . Not on file.   Social History Main Topics  . Smoking status: Never Smoker  . Smokeless tobacco: Never Used  . Alcohol use Yes     Comment: 2 per month  . Drug use: No  . Sexual activity: Yes   Other Topics Concern  . Not on file   Social History Narrative  . No narrative on file    Review of Systems  Constitutional: Positive for chills.  Musculoskeletal: Positive for myalgias (min in legs. ). Negative for arthralgias.  Skin: Positive for color change.       Objective:   Physical Exam  Constitutional: He is oriented to person, place, and time. He appears well-developed and well-nourished.  HENT:  Head: Normocephalic and atraumatic.  Eyes: Pupils are equal, round, and reactive to light.  Cardiovascular: Normal rate, regular rhythm and normal heart sounds.   Pulmonary/Chest: Effort normal and breath sounds normal.  Musculoskeletal: Normal range of motion.       Left elbow: He exhibits swelling (in area of overlying erythema. No apparent fluctuance.). He exhibits normal range of motion.  Neurological: He is alert and oriented to person, place, and time.  Skin: Skin is warm and dry. Rash noted. There is erythema (14x17cm erythema L elbow. ).  Psychiatric: He has  a normal mood and affect. His behavior is normal.   Vitals:   03/17/17 1544  BP: 120/77  Pulse: 76  Resp: 16  Temp: 97.7 F (36.5 C)  TempSrc: Oral  SpO2: 98%  Weight: 222 lb 3.2 oz (100.8 kg)  Height: 5' 10.25" (1.784 m)    Results for orders placed or performed in visit on 03/17/17  POCT CBC  Result Value Ref Range   WBC 9.8 4.6 - 10.2 K/uL   Lymph, poc 1.7 0.6 - 3.4   POC LYMPH PERCENT 17.5 10 - 50 %L   MID (cbc) 0.4 0 - 0.9   POC MID % 4.1 0 - 12 %M   POC Granulocyte 7.7 (A) 2 - 6.9   Granulocyte percent 78.4 37 - 80 %G   RBC 4.79 4.69 - 6.13 M/uL   Hemoglobin 14.0 (A)  14.1 - 18.1 g/dL   HCT, POC 40.4 (A) 43.5 - 53.7 %   MCV 84.2 80 - 97 fL   MCH, POC 29.3 27 - 31.2 pg   MCHC 34.8 31.8 - 35.4 g/dL   RDW, POC 12.6 %   Platelet Count, POC 206 142 - 424 K/uL   MPV 8.2 0 - 99.8 fL       Assessment & Plan:    Thomas Harding is a 61 y.o. male Cellulitis of left elbow - Plan: POCT CBC  - Slight increase in erythema from previous outline, but afebrile, CBC is slightly improved. Tolerating current antibiotics.  -Continue Levaquin, Septra, recheck in 48 hours with Windell Hummingbird, PA-C.  Hyperlipidemia, unspecified hyperlipidemia type - Plan: Comprehensive metabolic panel, Lipid panel  -Tolerating Lipitor. We'll check levels as has been on sufficient time to check status. Refilled for an additional 90 days, follow-up within 6 months for physical.  Meds ordered this encounter  Medications  . atorvastatin (LIPITOR) 10 MG tablet    Sig: Take 1 tablet (10 mg total) by mouth daily.    Dispense:  90 tablet    Refill:  0   Patient Instructions    Although there is some spread of redness outside the one area of your rash, the other area appears to be improving. Your white blood cell count was also improving today. Continue the Levaquin once per day, Septra twice per day, and recheck in 48 hours with Windell Hummingbird, PA-C. . If any further fevers, chills, or worsening symptoms prior to that time, return here sooner or the emergency room if needed.   Cellulitis, Adult Cellulitis is a skin infection. The infected area is usually red and tender. This condition occurs most often in the arms and lower legs. The infection can travel to the muscles, blood, and underlying tissue and become serious. It is very important to get treated for this condition. What are the causes? Cellulitis is caused by bacteria. The bacteria enter through a break in the skin, such as a cut, burn, insect bite, open sore, or crack. What increases the risk? This condition is more likely to occur in  people who:  Have a weak defense system (immune system).  Have open wounds on the skin such as cuts, burns, bites, and scrapes. Bacteria can enter the body through these open wounds.  Are older.  Have diabetes.  Have a type of long-lasting (chronic) liver disease (cirrhosis) or kidney disease.  Use IV drugs.  What are the signs or symptoms? Symptoms of this condition include:  Redness, streaking, or spotting on the skin.  Swollen area of  the skin.  Tenderness or pain when an area of the skin is touched.  Warm skin.  Fever.  Chills.  Blisters.  How is this diagnosed? This condition is diagnosed based on a medical history and physical exam. You may also have tests, including:  Blood tests.  Lab tests.  Imaging tests.  How is this treated? Treatment for this condition may include:  Medicines, such as antibiotic medicines or antihistamines.  Supportive care, such as rest and application of cold or warm cloths (cold or warm compresses) to the skin.  Hospital care, if the condition is severe.  The infection usually gets better within 1-2 days of treatment. Follow these instructions at home:  Take over-the-counter and prescription medicines only as told by your health care provider.  If you were prescribed an antibiotic medicine, take it as told by your health care provider. Do not stop taking the antibiotic even if you start to feel better.  Drink enough fluid to keep your urine clear or pale yellow.  Do not touch or rub the infected area.  Raise (elevate) the infected area above the level of your heart while you are sitting or lying down.  Apply warm or cold compresses to the affected area as told by your health care provider.  Keep all follow-up visits as told by your health care provider. This is important. These visits let your health care provider make sure a more serious infection is not developing. Contact a health care provider if:  You have a  fever.  Your symptoms do not improve within 1-2 days of starting treatment.  Your bone or joint underneath the infected area becomes painful after the skin has healed.  Your infection returns in the same area or another area.  You notice a swollen bump in the infected area.  You develop new symptoms.  You have a general ill feeling (malaise) with muscle aches and pains. Get help right away if:  Your symptoms get worse.  You feel very sleepy.  You develop vomiting or diarrhea that persists.  You notice red streaks coming from the infected area.  Your red area gets larger or turns dark in color. This information is not intended to replace advice given to you by your health care provider. Make sure you discuss any questions you have with your health care provider. Document Released: 07/10/2005 Document Revised: 02/08/2016 Document Reviewed: 08/09/2015 Elsevier Interactive Patient Education  2017 [employer].    IF you received an x-ray today, you will receive an invoice from [facility]. Please contact [facility] at [phone] with questions or concerns regarding your invoice.   IF you received labwork today, you will receive an invoice from [place]. Please contact [facility] at [phone] with questions or concerns regarding your invoice.   Our billing staff will not be able to assist you with questions regarding bills from these companies.  You will be contacted with the lab results as soon as they are available. The fastest way to get your results is to activate your My Chart account. Instructions are located on the last page of this paperwork. If you have not heard from [place] regarding the results in 2 weeks, please contact this office.       I personally performed the services described in this documentation, which was scribed in my presence. The recorded information has been reviewed and considered for accuracy and completeness, addended by me as  needed, and agree with information above.  Signed,   Merri Ray,  MD [facility] [place].  03/17/17 6:46 PM

## 2017-03-18 LAB — LIPID PANEL
Chol/HDL Ratio: 2.8 ratio (ref 0.0–5.0)
Cholesterol, Total: 134 mg/dL (ref 100–199)
HDL: 48 mg/dL (ref >39)
LDL Calculated: 69 mg/dL (ref 0–99)
Triglycerides: 84 mg/dL (ref 0–149)
VLDL Cholesterol Cal: 17 mg/dL (ref 5–40)

## 2017-03-18 LAB — COMPREHENSIVE METABOLIC PANEL
ALT: 21 IU/L (ref 0–44)
AST: 20 IU/L (ref 0–40)
Albumin/Globulin Ratio: 1.5 (ref 1.2–2.2)
Albumin: 4.4 g/dL (ref 3.6–4.8)
Alkaline Phosphatase: 98 IU/L (ref 39–117)
BUN/Creatinine Ratio: 13 (ref 10–24)
BUN: 16 mg/dL (ref 8–27)
Bilirubin Total: 1.1 mg/dL (ref 0.0–1.2)
CO2: 21 mmol/L (ref 18–29)
Calcium: 9.1 mg/dL (ref 8.6–10.2)
Chloride: 99 mmol/L (ref 96–106)
Creatinine, Ser: 1.24 mg/dL (ref 0.76–1.27)
GFR calc Af Amer: 73 mL/min/{1.73_m2} (ref >59)
GFR calc non Af Amer: 63 mL/min/{1.73_m2} (ref >59)
Globulin, Total: 2.9 g/dL (ref 1.5–4.5)
Glucose: 84 mg/dL (ref 65–99)
Potassium: 4.3 mmol/L (ref 3.5–5.2)
Sodium: 136 mmol/L (ref 134–144)
Total Protein: 7.3 g/dL (ref 6.0–8.5)

## 2017-03-19 ENCOUNTER — Ambulatory Visit (INDEPENDENT_AMBULATORY_CARE_PROVIDER_SITE_OTHER): Payer: BLUE CROSS/BLUE SHIELD | Admitting: Physician Assistant

## 2017-03-19 ENCOUNTER — Encounter: Payer: Self-pay | Admitting: Physician Assistant

## 2017-03-19 VITALS — BP 116/73 | HR 78 | Temp 98.3°F | Resp 17 | Ht 70.0 in | Wt 213.0 lb

## 2017-03-19 DIAGNOSIS — L03114 Cellulitis of left upper limb: Secondary | ICD-10-CM | POA: Diagnosis not present

## 2017-03-19 DIAGNOSIS — E785 Hyperlipidemia, unspecified: Secondary | ICD-10-CM

## 2017-03-19 NOTE — Progress Notes (Signed)
   Thomas Harding  MRN: [id number] DOB: September 13, 1956  PCP: [name], MD  Chief Complaint  Patient presents with  . Follow-up    elbow infection     Subjective:  Pt presents to clinic for recheck elbow infection.  He has been tolerating his medications ok.  He feels like the redness has gotten better - it does seem to be extending down towards his fingers more but that is also where he rests his arm driving.  No fever or chills. Pain has resolved.  Review of Systems  Constitutional: Negative for chills and fever.  Gastrointestinal: Negative for nausea.  Skin: Positive for wound.    Patient Active Problem List   Diagnosis Date Noted  . Shortness of breath 11/22/2016    Current Outpatient Prescriptions on File Prior to Visit  Medication Sig Dispense Refill  . atorvastatin (LIPITOR) 10 MG tablet Take 1 tablet (10 mg total) by mouth daily. 90 tablet 0  . levofloxacin (LEVAQUIN) 500 MG tablet Take 1 tablet (500 mg total) by mouth daily. 7 tablet 0  . sulfamethoxazole-trimethoprim (BACTRIM DS,SEPTRA DS) 800-160 MG tablet Take 2 tablets by mouth 2 (two) times daily. 40 tablet 0   No current facility-administered medications on file prior to visit.     Allergies  Allergen Reactions  . Bee Venom Anaphylaxis  . Penicillins Hives and Swelling    Pt patients past, family and social history were reviewed and updated.   Objective:  BP 116/73   Pulse 78   Temp 98.3 F (36.8 C) (Oral)   Resp 17   Ht 5\' 10"  (1.778 m)   Wt 213 lb (96.6 kg)   SpO2 98%   BMI 30.56 kg/m   Physical Exam  Constitutional: He is oriented to person, place, and time and well-developed, well-nourished, and in no distress.  HENT:  Head: Normocephalic and atraumatic.  Right Ear: External ear normal.  Left Ear: External ear normal.  Eyes: Conjunctivae are normal.  Neck: Normal range of motion.  Pulmonary/Chest: Effort normal.  Neurological: He is alert and oriented to person, place, and time.  Gait normal.  Skin: Skin is warm and dry.  No TTP over olecranon process - slight erythema outside of drawn line from 6/4.  All distal to the elbow - still some fluctuance over olecranon process  Psychiatric: Mood, memory, affect and judgment normal.    Assessment and Plan :  Cellulitis of left elbow - improved - area marked but wonder if this is from where his arm rest - he can start to use compression on the elbow to see if the bursa size can be reduced  Hyperlipidemia, unspecified hyperlipidemia type - improved labs on medications  Windell Hummingbird PA-C  [facility] [place] 03/19/2017 4:03 PM

## 2017-03-19 NOTE — Patient Instructions (Signed)
     IF you received an x-ray today, you will receive an invoice from [facility]. Please contact  Radiology at [phone] with questions or concerns regarding your invoice.   IF you received labwork today, you will receive an invoice from [facility]. Please contact [facility] at [phone] with questions or concerns regarding your invoice.   Our billing staff will not be able to assist you with questions regarding bills from these companies.  You will be contacted with the lab results as soon as they are available. The fastest way to get your results is to activate your My Chart account. Instructions are located on the last page of this paperwork. If you have not heard from us regarding the results in 2 weeks, please contact this office.     

## 2017-08-07 ENCOUNTER — Ambulatory Visit (INDEPENDENT_AMBULATORY_CARE_PROVIDER_SITE_OTHER): Payer: No Typology Code available for payment source | Admitting: Physician Assistant

## 2017-08-07 DIAGNOSIS — Z23 Encounter for immunization: Secondary | ICD-10-CM

## 2017-08-07 DIAGNOSIS — H938X3 Other specified disorders of ear, bilateral: Secondary | ICD-10-CM

## 2017-08-07 MED ORDER — FLUTICASONE PROPIONATE 50 MCG/ACT NA SUSP: 2.0000 | Freq: Every day | NASAL | 12 refills | Status: DC

## 2017-08-07 MED ORDER — GUAIFENESIN ER 1200 MG PO TB12: 1.0000 | ORAL_TABLET | Freq: Two times a day (BID) | ORAL | 1 refills | Status: DC | PRN

## 2017-08-07 NOTE — Progress Notes (Signed)
PRIMARY CARE AT [facility] [address], [place] [address] [phone]  Date:  08/07/2017   Name:  Thomas Harding   DOB:  1956/06/10   MRN:  [id number]  PCP:  [name], MD    History of Present Illness:  Thomas Harding is a 61 y.o. male patient who presents to PCP with  Chief Complaint  Patient presents with  . Sore Throat    x 1 wk  . Ear Pain    left  . Sinusitis    x 1 wk     Patient notes sore throat, ear pain, and congestion for the last week.  Cough is minimal.  No watery eyes at this time.  Ear pain is moreso prominent of the left ear.  No drainage.  No fever.    Patient Active Problem List   Diagnosis Date Noted  . Shortness of breath 11/22/2016    Past Medical History:  Diagnosis Date  . Hyperlipidemia     No past surgical history on file.  Social History  Substance Use Topics  . Smoking status: Never Smoker  . Smokeless tobacco: Never Used  . Alcohol use Yes     Comment: 2 per month    No family history on file.  Allergies  Allergen Reactions  . Bee Venom Anaphylaxis  . Penicillins Hives and Swelling    Medication list has been reviewed and updated.  Current Outpatient Prescriptions on File Prior to Visit  Medication Sig Dispense Refill  . atorvastatin (LIPITOR) 10 MG tablet Take 1 tablet (10 mg total) by mouth daily. 90 tablet 0  . levofloxacin (LEVAQUIN) 500 MG tablet Take 1 tablet (500 mg total) by mouth daily. 7 tablet 0  . sulfamethoxazole-trimethoprim (BACTRIM DS,SEPTRA DS) 800-160 MG tablet Take 2 tablets by mouth 2 (two) times daily. 40 tablet 0   No current facility-administered medications on file prior to visit.     ROS ROS otherwise unremarkable unless listed above.  Physical Examination: There were no vitals taken for this visit. Ideal Body Weight:    Physical Exam  Constitutional: He is oriented to person, place, and time. He appears well-developed and well-nourished. No distress.  HENT:  Head: Normocephalic  and atraumatic.  Right Ear: External ear and ear canal normal. Tympanic membrane is bulging (slight).  Left Ear: Tympanic membrane, external ear and ear canal normal.  Nose: Mucosal edema and rhinorrhea present. Right sinus exhibits no maxillary sinus tenderness and no frontal sinus tenderness. Left sinus exhibits no maxillary sinus tenderness and no frontal sinus tenderness.  Mouth/Throat: No uvula swelling. No oropharyngeal exudate, posterior oropharyngeal edema or posterior oropharyngeal erythema.  Eyes: Pupils are equal, round, and reactive to light. Conjunctivae, EOM and lids are normal. Right eye exhibits normal extraocular motion. Left eye exhibits normal extraocular motion.  Neck: Trachea normal and full passive range of motion without pain. No edema and no erythema present.  Cardiovascular: Normal rate.   Pulmonary/Chest: Effort normal. No respiratory distress. He has no decreased breath sounds. He has no wheezes. He has no rhonchi.  Neurological: He is alert and oriented to person, place, and time.  Skin: Skin is warm and dry. He is not diaphoretic.  Psychiatric: He has a normal mood and affect. His behavior is normal.     Assessment and Plan: Thomas Harding is a 61 y.o. male who is here today  Doxycycline 100 bid for 7 days if no improvement within the next 72 hours. Congestion of both  ears - Plan: fluticasone (FLONASE) 50 MCG/ACT nasal spray, Guaifenesin (MUCINEX MAXIMUM STRENGTH) 1200 MG TB12  Need for prophylactic vaccination and inoculation against influenza - Plan: Flu Vaccine QUAD 36+ mos IM  Ivar Drape, PA-C [facility] 10/28/201812:40 PM

## 2017-08-07 NOTE — Patient Instructions (Addendum)
Please hydrate well with 64 oz of water if not more Take the medications as prescribed. You can take ibuprofen or tylenol for pain and fever.  (example ibuprofen 600mg  every 8 hours--take with food as well)  Viral Respiratory Infection A viral respiratory infection is an illness that affects parts of the body used for breathing, like the lungs, nose, and throat. It is caused by a germ called a virus. Some examples of this kind of infection are:  A cold.  The flu (influenza).  A respiratory syncytial virus (RSV) infection.  How do I know if I have this infection? Most of the time this infection causes:  A stuffy or runny nose.  Yellow or green fluid in the nose.  A cough.  Sneezing.  Tiredness (fatigue).  Achy muscles.  A sore throat.  Sweating or chills.  A fever.  A headache.  How is this infection treated? If the flu is diagnosed early, it may be treated with an antiviral medicine. This medicine shortens the length of time a person has symptoms. Symptoms may be treated with over-the-counter and prescription medicines, such as:  Expectorants. These make it easier to cough up mucus.  Decongestant nasal sprays.  Doctors do not prescribe antibiotic medicines for viral infections. They do not work with this kind of infection. How do I know if I should stay home? To keep others from getting sick, stay home if you have:  A fever.  A lasting cough.  A sore throat.  A runny nose.  Sneezing.  Muscles aches.  Headaches.  Tiredness.  Weakness.  Chills.  Sweating.  An upset stomach (nausea).  Follow these instructions at home:  Rest as much as possible.  Take over-the-counter and prescription medicines only as told by your doctor.  Drink enough fluid to keep your pee (urine) clear or pale yellow.  Gargle with salt water. Do this 3-4 times per day or as needed. To make a salt-water mixture, dissolve -1 tsp of salt in 1 cup of warm water. Make sure  the salt dissolves all the way.  Use nose drops made from salt water. This helps with stuffiness (congestion). It also helps soften the skin around your nose.  Do not drink alcohol.  Do not use tobacco products, including cigarettes, chewing tobacco, and e-cigarettes. If you need help quitting, ask your doctor. Get help if:  Your symptoms last for 10 days or longer.  Your symptoms get worse over time.  You have a fever.  You have very bad pain in your face or forehead.  Parts of your jaw or neck become very swollen. Get help right away if:  You feel pain or pressure in your chest.  You have shortness of breath.  You faint or feel like you will faint.  You keep throwing up (vomiting).  You feel confused. This information is not intended to replace advice given to you by your health care provider. Make sure you discuss any questions you have with your health care provider. Document Released: 09/12/2008 Document Revised: 03/07/2016 Document Reviewed: 03/08/2015 Elsevier Interactive Patient Education  2018 [employer].    IF you received an x-ray today, you will receive an invoice from [facility]. Please contact [facility]iology at [phone] with questions or concerns regarding your invoice.   IF you received labwork today, you will receive an invoice from [facility]. Please contact [facility] at [phone] with questions or concerns regarding your invoice.   Our billing staff will not be able to assist  you with questions regarding bills from these companies.  You will be contacted with the lab results as soon as they are available. The fastest way to get your results is to activate your My Chart account. Instructions are located on the last page of this paperwork. If you have not heard from [place] regarding the results in 2 weeks, please contact this office.

## 2017-08-10 ENCOUNTER — Encounter: Payer: Self-pay | Admitting: Physician Assistant

## 2017-10-20 DIAGNOSIS — L570 Actinic keratosis: Secondary | ICD-10-CM | POA: Diagnosis not present

## 2017-12-29 DIAGNOSIS — L570 Actinic keratosis: Secondary | ICD-10-CM | POA: Diagnosis not present

## 2018-01-21 ENCOUNTER — Encounter: Payer: Self-pay | Admitting: Physician Assistant

## 2018-03-09 ENCOUNTER — Encounter: Payer: Self-pay | Admitting: Family Medicine

## 2018-04-14 DIAGNOSIS — M25511 Pain in right shoulder: Secondary | ICD-10-CM | POA: Diagnosis not present

## 2018-04-14 DIAGNOSIS — R9389 Abnormal findings on diagnostic imaging of other specified body structures: Secondary | ICD-10-CM | POA: Diagnosis not present

## 2018-04-15 ENCOUNTER — Ambulatory Visit
Admission: RE | Admit: 2018-04-15 | Discharge: 2018-04-15 | Disposition: A | Discharge status: Home / Self Care | Payer: Self-pay | Source: Ambulatory Visit | Attending: Physician Assistant | Admitting: Physician Assistant

## 2018-04-15 ENCOUNTER — Other Ambulatory Visit: Payer: Self-pay | Admitting: Physician Assistant

## 2018-04-15 ENCOUNTER — Ambulatory Visit
Admission: RE | Admit: 2018-04-15 | Discharge: 2018-04-15 | Disposition: A | Discharge status: Home / Self Care | Payer: BLUE CROSS/BLUE SHIELD | Source: Ambulatory Visit | Attending: Physician Assistant | Admitting: Physician Assistant

## 2018-04-15 DIAGNOSIS — R918 Other nonspecific abnormal finding of lung field: Secondary | ICD-10-CM | POA: Diagnosis not present

## 2018-04-15 DIAGNOSIS — R9389 Abnormal findings on diagnostic imaging of other specified body structures: Secondary | ICD-10-CM

## 2018-04-15 DIAGNOSIS — R52 Pain, unspecified: Secondary | ICD-10-CM

## 2018-04-27 DIAGNOSIS — M25511 Pain in right shoulder: Secondary | ICD-10-CM | POA: Diagnosis not present

## 2018-04-27 DIAGNOSIS — M19011 Primary osteoarthritis, right shoulder: Secondary | ICD-10-CM | POA: Diagnosis not present

## 2018-04-30 DIAGNOSIS — G8918 Other acute postprocedural pain: Secondary | ICD-10-CM | POA: Diagnosis not present

## 2018-04-30 DIAGNOSIS — Y999 Unspecified external cause status: Secondary | ICD-10-CM | POA: Diagnosis not present

## 2018-04-30 DIAGNOSIS — M958 Other specified acquired deformities of musculoskeletal system: Secondary | ICD-10-CM | POA: Diagnosis not present

## 2018-04-30 DIAGNOSIS — S43431A Superior glenoid labrum lesion of right shoulder, initial encounter: Secondary | ICD-10-CM | POA: Diagnosis not present

## 2018-04-30 DIAGNOSIS — X58XXXA Exposure to other specified factors, initial encounter: Secondary | ICD-10-CM | POA: Diagnosis not present

## 2018-04-30 DIAGNOSIS — S46011A Strain of muscle(s) and tendon(s) of the rotator cuff of right shoulder, initial encounter: Secondary | ICD-10-CM | POA: Diagnosis not present

## 2018-04-30 DIAGNOSIS — S4381XA Sprain of other specified parts of right shoulder girdle, initial encounter: Secondary | ICD-10-CM | POA: Diagnosis not present

## 2018-04-30 DIAGNOSIS — M75121 Complete rotator cuff tear or rupture of right shoulder, not specified as traumatic: Secondary | ICD-10-CM | POA: Diagnosis not present

## 2018-04-30 DIAGNOSIS — M19011 Primary osteoarthritis, right shoulder: Secondary | ICD-10-CM | POA: Diagnosis not present

## 2018-05-06 DIAGNOSIS — M25511 Pain in right shoulder: Secondary | ICD-10-CM | POA: Diagnosis not present

## 2018-05-12 DIAGNOSIS — M25511 Pain in right shoulder: Secondary | ICD-10-CM | POA: Diagnosis not present

## 2018-05-14 DIAGNOSIS — M25511 Pain in right shoulder: Secondary | ICD-10-CM | POA: Diagnosis not present

## 2018-05-18 DIAGNOSIS — M25511 Pain in right shoulder: Secondary | ICD-10-CM | POA: Diagnosis not present

## 2018-05-21 DIAGNOSIS — M25511 Pain in right shoulder: Secondary | ICD-10-CM | POA: Diagnosis not present

## 2018-05-28 DIAGNOSIS — M25511 Pain in right shoulder: Secondary | ICD-10-CM | POA: Diagnosis not present

## 2018-06-01 DIAGNOSIS — M25511 Pain in right shoulder: Secondary | ICD-10-CM | POA: Diagnosis not present

## 2018-06-04 DIAGNOSIS — M25511 Pain in right shoulder: Secondary | ICD-10-CM | POA: Diagnosis not present

## 2018-06-08 ENCOUNTER — Ambulatory Visit (INDEPENDENT_AMBULATORY_CARE_PROVIDER_SITE_OTHER): Payer: BLUE CROSS/BLUE SHIELD | Admitting: Family Medicine

## 2018-06-08 ENCOUNTER — Other Ambulatory Visit: Payer: Self-pay

## 2018-06-08 ENCOUNTER — Encounter: Payer: Self-pay | Admitting: Family Medicine

## 2018-06-08 VITALS — BP 145/88 | HR 70 | Temp 98.5°F | Ht 70.0 in | Wt 229.2 lb

## 2018-06-08 DIAGNOSIS — Z1159 Encounter for screening for other viral diseases: Secondary | ICD-10-CM

## 2018-06-08 DIAGNOSIS — M25511 Pain in right shoulder: Secondary | ICD-10-CM | POA: Diagnosis not present

## 2018-06-08 DIAGNOSIS — E781 Pure hyperglyceridemia: Secondary | ICD-10-CM | POA: Diagnosis not present

## 2018-06-08 DIAGNOSIS — Z114 Encounter for screening for human immunodeficiency virus [HIV]: Secondary | ICD-10-CM | POA: Diagnosis not present

## 2018-06-08 NOTE — Progress Notes (Addendum)
Subjective:  By signing my name below, I, Thomas Harding, attest that this documentation has been prepared under the direction and in the presence of Thomas Agreste, MD Electronically Signed: Ladene Artist, ED Scribe 06/08/2018 at 4:37 PM.   Patient ID: Thomas Harding, male    DOB: 1956/03/05, 62 y.o.   MRN: [id number]  Chief Complaint  Patient presents with  . Medication Refill    lipator out for 3 weeks now    HPI  Thomas Harding is a 62 y.o. male who presents to Primary Care at [facility] for med refill. Pt is not fasting at this time. He had 5 peanut butter pretzels ~11 AM and an apple ~45 minutes ago.  Hyperlipidemia Lab Results  Component Value Date   CHOL 134 03/17/2017   HDL 48 03/17/2017   LDLCALC 69 03/17/2017   TRIG 84 03/17/2017   CHOLHDL 2.8 03/17/2017   Lab Results  Component Value Date   ALT 21 03/17/2017   AST 20 03/17/2017   ALKPHOS 98 03/17/2017   BILITOT 1.1 03/17/2017  Has been on Lipitor 10 mg qd but ran out of meds 3 wks ago. Lipids have sig improved from 2017 when trigs were 308. - Pt reports that he did have myalgias with Lipitor which were tolerable. Denies n/v.  BP Pt states that he occasionally checks his BP at home which ranges from 122-142/80-82.  Health Maintenance Overdue for Hep C screening, HIV screening and colonoscopy. - Pt agrees to Hep C screening and HIV screening today. He has had a colonoscopy by Dr. Collene Mares ~ 10 yrs ago.  Patient Active Problem List   Diagnosis Date Noted  . Shortness of breath 11/22/2016   Past Medical History:  Diagnosis Date  . Hyperlipidemia    No past surgical history on file. Allergies  Allergen Reactions  . Bee Venom Anaphylaxis  . Penicillins Hives and Swelling   Prior to Admission medications   Medication Sig Start Date End Date Taking? Authorizing Provider  atorvastatin (LIPITOR) 10 MG tablet Take 1 tablet (10 mg total) by mouth daily. 03/17/17   Thomas Agreste, MD  fluticasone (FLONASE) 50  MCG/ACT nasal spray Place 2 sprays into both nostrils daily. 08/07/17   Ivar Drape D, PA  Guaifenesin (MUCINEX MAXIMUM STRENGTH) 1200 MG TB12 Take 1 tablet (1,200 mg total) by mouth every 12 (twelve) hours as needed. 08/07/17   Ivar Drape D, PA  levofloxacin (LEVAQUIN) 500 MG tablet Take 1 tablet (500 mg total) by mouth daily. 03/15/17   Wardell Honour, MD  sulfamethoxazole-trimethoprim (BACTRIM DS,SEPTRA DS) 800-160 MG tablet Take 2 tablets by mouth 2 (two) times daily. 03/15/17   Wardell Honour, MD   Social History   Socioeconomic History  . Marital status: Single    Spouse name: Not on file  . Number of children: Not on file  . Years of education: Not on file  . Highest education level: Not on file  Occupational History  . Not on file  Social Needs  . Financial resource strain: Not on file  . Food insecurity:    Worry: Not on file    Inability: Not on file  . Transportation needs:    Medical: Not on file    Non-medical: Not on file  Tobacco Use  . Smoking status: Never Smoker  . Smokeless tobacco: Never Used  Substance and Sexual Activity  . Alcohol use: Yes    Comment: 2 per month  . Drug use: No  .  Sexual activity: Yes  Lifestyle  . Physical activity:    Days per week: Not on file    Minutes per session: Not on file  . Stress: Not on file  Relationships  . Social connections:    Talks on phone: Not on file    Gets together: Not on file    Attends religious service: Not on file    Active member of club or organization: Not on file    Attends meetings of clubs or organizations: Not on file    Relationship status: Not on file  . Intimate partner violence:    Fear of current or ex partner: Not on file    Emotionally abused: Not on file    Physically abused: Not on file    Forced sexual activity: Not on file  Other Topics Concern  . Not on file  Social History Narrative  . Not on file   Review of Systems  Gastrointestinal: Negative for nausea and  vomiting.  Musculoskeletal: Myalgias: resolved.      Objective:   Physical Exam  Constitutional: He is oriented to person, place, and time. He appears well-developed and well-nourished.  HENT:  Head: Normocephalic and atraumatic.  Eyes: Pupils are equal, round, and reactive to light. EOM are normal.  Neck: No JVD present. Carotid bruit is not present.  Cardiovascular: Normal rate, regular rhythm and normal heart sounds.  No murmur heard. Pulmonary/Chest: Effort normal and breath sounds normal. He has no rales.  Musculoskeletal: He exhibits no edema.  Neurological: He is alert and oriented to person, place, and time.  Skin: Skin is warm and dry.  Psychiatric: He has a normal mood and affect.  Vitals reviewed.   Vitals:   06/08/18 1611  BP: (!) 145/88  Pulse: 70  Temp: 98.5 F (36.9 C)  TempSrc: Oral  SpO2: 96%  Weight: 229 lb 3.2 oz (104 kg)  Height: 5\' 10"  (1.778 m)      Assessment & Plan:   Thomas Harding is a 62 y.o. male Hypertriglyceridemia - Plan: Comprehensive metabolic panel, Lipid panel  -Off meds for 3 weeks, will recheck levels at fasting labs recently next week to determine levels off meds.  If pure hypertriglyceridemia,  could consider other agents besides statin due to myalgias.  Need for hepatitis C screening test - Plan: Hepatitis C antibody  Screening for HIV without presence of risk factors - Plan: HIV antibody  Plan for physical in the next few months.   No orders of the defined types were placed in this encounter.  Patient Instructions    I will check lipids on fasting blood work to get baseline readings off meds. We have other options for triglycerides that may be less likely to cause muscle aches.   Keep an eye on blood pressure, return if over 140/90.   Call Dr. Collene Mares for colonoscopy as you appear to be due.    How to Take Your Blood Pressure You can take your blood pressure at home with a machine. You may need to check your blood  pressure at home:  To check if you have high blood pressure (hypertension).  To check your blood pressure over time.  To make sure your blood pressure medicine is working.  Supplies needed: You will need a blood pressure machine, or monitor. You can buy one at a drugstore or online. When choosing one:  Choose one with an arm cuff.  Choose one that wraps around your upper arm. Only one finger  should fit between your arm and the cuff.  Do not choose one that measures your blood pressure from your wrist or finger.  Your doctor can suggest a monitor. How to prepare Avoid these things for 30 minutes before checking your blood pressure:  Drinking caffeine.  Drinking alcohol.  Eating.  Smoking.  Exercising.  Five minutes before checking your blood pressure:  Pee.  Sit in a dining chair. Avoid sitting in a soft couch or armchair.  Be quiet. Do not talk.  How to take your blood pressure Follow the instructions that came with your machine. If you have a digital blood pressure monitor, these may be the instructions: 1. Sit up straight. 2. Place your feet on the floor. Do not cross your ankles or legs. 3. Rest your left arm at the level of your heart. You may rest it on a table, desk, or chair. 4. Pull up your shirt sleeve. 5. Wrap the blood pressure cuff around the upper part of your left arm. The cuff should be 1 inch (2.5 cm) above your elbow. It is best to wrap the cuff around bare skin. 6. Fit the cuff snugly around your arm. You should be able to place only one finger between the cuff and your arm. 7. Put the cord inside the groove of your elbow. 8. Press the power button. 9. Sit quietly while the cuff fills with air and loses air. 10. Write down the numbers on the screen. 11. Wait 2-3 minutes and then repeat steps 1-10.  What do the numbers mean? Two numbers make up your blood pressure. The first number is called systolic pressure. The second is called diastolic  pressure. An example of a blood pressure reading is "120 over 80" (or 120/80). If you are an adult and do not have a medical condition, use this guide to find out if your blood pressure is normal: Normal  First number: below 120.  Second number: below 80. Elevated  First number: 120-129.  Second number: below 80. Hypertension stage 1  First number: 130-139.  Second number: 80-89. Hypertension stage 2  First number: 140 or above.  Second number: 42 or above. Your blood pressure is above normal even if only the top or bottom number is above normal. Follow these instructions at home:  Check your blood pressure as often as your doctor tells you to.  Take your monitor to your next doctor's appointment. Your doctor will: ? Make sure you are using it correctly. ? Make sure it is working right.  Make sure you understand what your blood pressure numbers should be.  Tell your doctor if your medicines are causing side effects. Contact a doctor if:  Your blood pressure keeps being high. Get help right away if:  Your first blood pressure number is higher than 180.  Your second blood pressure number is higher than 120. This information is not intended to replace advice given to you by your health care provider. Make sure you discuss any questions you have with your health care provider. Document Released: 09/12/2008 Document Revised: 08/28/2016 Document Reviewed: 03/08/2016 Elsevier Interactive Patient Education  [employer].   If you have lab work done today you will be contacted with your lab results within the next 2 weeks.  If you have not heard from [place] then please contact us. The fastest way to get your results is to register for My Chart.   IF you received an x-ray today, you will receive an invoice from  [facility] Radiology. Please contact [facility]iology at [phone] with questions or concerns regarding your invoice.   IF you received labwork today, you  will receive an invoice from [facility]. Please contact [facility] at [phone] with questions or concerns regarding your invoice.   Our billing staff will not be able to assist you with questions regarding bills from these companies.  You will be contacted with the lab results as soon as they are available. The fastest way to get your results is to activate your My Chart account. Instructions are located on the last page of this paperwork. If you have not heard from [place] regarding the results in 2 weeks, please contact this office.       I personally performed the services described in this documentation, which was scribed in my presence. The recorded information has been reviewed and considered for accuracy and completeness, addended by me as needed, and agree with information above.  Signed,   Merri Ray, MD Primary Care at [facility].  06/08/18 5:01 PM

## 2018-06-08 NOTE — Patient Instructions (Addendum)
I will check lipids on fasting blood work to get baseline readings off meds. We have other options for triglycerides that may be less likely to cause muscle aches.   Keep an eye on blood pressure, return if over 140/90.   Call Dr. Collene Mares for colonoscopy as you appear to be due.    How to Take Your Blood Pressure You can take your blood pressure at home with a machine. You may need to check your blood pressure at home:  To check if you have high blood pressure (hypertension).  To check your blood pressure over time.  To make sure your blood pressure medicine is working.  Supplies needed: You will need a blood pressure machine, or monitor. You can buy one at a drugstore or online. When choosing one:  Choose one with an arm cuff.  Choose one that wraps around your upper arm. Only one finger should fit between your arm and the cuff.  Do not choose one that measures your blood pressure from your wrist or finger.  Your doctor can suggest a monitor. How to prepare Avoid these things for 30 minutes before checking your blood pressure:  Drinking caffeine.  Drinking alcohol.  Eating.  Smoking.  Exercising.  Five minutes before checking your blood pressure:  Pee.  Sit in a dining chair. Avoid sitting in a soft couch or armchair.  Be quiet. Do not talk.  How to take your blood pressure Follow the instructions that came with your machine. If you have a digital blood pressure monitor, these may be the instructions: 1. Sit up straight. 2. Place your feet on the floor. Do not cross your ankles or legs. 3. Rest your left arm at the level of your heart. You may rest it on a table, desk, or chair. 4. Pull up your shirt sleeve. 5. Wrap the blood pressure cuff around the upper part of your left arm. The cuff should be 1 inch (2.5 cm) above your elbow. It is best to wrap the cuff around bare skin. 6. Fit the cuff snugly around your arm. You should be able to place only one finger  between the cuff and your arm. 7. Put the cord inside the groove of your elbow. 8. Press the power button. 9. Sit quietly while the cuff fills with air and loses air. 10. Write down the numbers on the screen. 11. Wait 2-3 minutes and then repeat steps 1-10.  What do the numbers mean? Two numbers make up your blood pressure. The first number is called systolic pressure. The second is called diastolic pressure. An example of a blood pressure reading is "120 over 80" (or 120/80). If you are an adult and do not have a medical condition, use this guide to find out if your blood pressure is normal: Normal  First number: below 120.  Second number: below 80. Elevated  First number: 120-129.  Second number: below 80. Hypertension stage 1  First number: 130-139.  Second number: 80-89. Hypertension stage 2  First number: 140 or above.  Second number: 30 or above. Your blood pressure is above normal even if only the top or bottom number is above normal. Follow these instructions at home:  Check your blood pressure as often as your doctor tells you to.  Take your monitor to your next doctor's appointment. Your doctor will: ? Make sure you are using it correctly. ? Make sure it is working right.  Make sure you understand what your blood pressure numbers should  be.  Tell your doctor if your medicines are causing side effects. Contact a doctor if:  Your blood pressure keeps being high. Get help right away if:  Your first blood pressure number is higher than 180.  Your second blood pressure number is higher than 120. This information is not intended to replace advice given to you by your health care provider. Make sure you discuss any questions you have with your health care provider. Document Released: 09/12/2008 Document Revised: 08/28/2016 Document Reviewed: 03/08/2016 Elsevier Interactive Patient Education  [employer].   If you have lab work done today you will be  contacted with your lab results within the next 2 weeks.  If you have not heard from [place] then please contact us. The fastest way to get your results is to register for My Chart.   IF you received an x-ray today, you will receive an invoice from [facility] Radiology. Please contact [facility] Radiology at [phone] with questions or concerns regarding your invoice.   IF you received labwork today, you will receive an invoice from [facility]. Please contact [facility] at [phone] with questions or concerns regarding your invoice.   Our billing staff will not be able to assist you with questions regarding bills from these companies.  You will be contacted with the lab results as soon as they are available. The fastest way to get your results is to activate your My Chart account. Instructions are located on the last page of this paperwork. If you have not heard from [place] regarding the results in 2 weeks, please contact this office.

## 2018-06-11 DIAGNOSIS — M25511 Pain in right shoulder: Secondary | ICD-10-CM | POA: Diagnosis not present

## 2018-06-18 DIAGNOSIS — M25511 Pain in right shoulder: Secondary | ICD-10-CM | POA: Diagnosis not present

## 2018-06-22 DIAGNOSIS — M25511 Pain in right shoulder: Secondary | ICD-10-CM | POA: Diagnosis not present

## 2018-06-24 ENCOUNTER — Other Ambulatory Visit: Payer: Self-pay

## 2018-06-24 ENCOUNTER — Ambulatory Visit (INDEPENDENT_AMBULATORY_CARE_PROVIDER_SITE_OTHER): Payer: BLUE CROSS/BLUE SHIELD | Admitting: Family Medicine

## 2018-06-24 ENCOUNTER — Encounter: Payer: Self-pay | Admitting: Family Medicine

## 2018-06-24 VITALS — BP 126/83 | HR 66 | Temp 98.9°F | Ht 71.0 in | Wt 228.4 lb

## 2018-06-24 DIAGNOSIS — Z23 Encounter for immunization: Secondary | ICD-10-CM | POA: Diagnosis not present

## 2018-06-24 DIAGNOSIS — Z125 Encounter for screening for malignant neoplasm of prostate: Secondary | ICD-10-CM | POA: Diagnosis not present

## 2018-06-24 DIAGNOSIS — Z0000 Encounter for general adult medical examination without abnormal findings: Secondary | ICD-10-CM

## 2018-06-24 NOTE — Patient Instructions (Addendum)
Call your gastroenterologist to see if you are due for colonoscopy and let me know the last date of colonoscopy.   Shingles vaccine can be given at your pharmacy. Flu shot today.   Depending on lipid readings, can decide on meds.   Tylenol if needed for pain to see if that will let you sleep better. You can also use your other pain medication if needed, and can discuss with Dr. Theda Sers if persistent pain.   See foods to avoid for heartburn, occasional pepcid ok. Please follow up to discuss further in next few weeks.   Thank you for coming in today.   Heartburn Heartburn is a type of pain or discomfort that can happen in the throat or chest. It is often described as a burning pain. It may also cause a bad taste in the mouth. Heartburn may feel worse when you lie down or bend over, and it is often worse at night. Heartburn may be caused by stomach contents that move back up into the esophagus (reflux). Follow these instructions at home: Take these actions to decrease your discomfort and to help avoid complications. Diet  Follow a diet as recommended by your health care provider. This may involve avoiding foods and drinks such as: ? Coffee and tea (with or without caffeine). ? Drinks that contain alcohol. ? Energy drinks and sports drinks. ? Carbonated drinks or sodas. ? Chocolate and cocoa. ? Peppermint and mint flavorings. ? Garlic and onions. ? Horseradish. ? Spicy and acidic foods, including peppers, chili powder, curry powder, vinegar, hot sauces, and barbecue sauce. ? Citrus fruit juices and citrus fruits, such as oranges, lemons, and limes. ? Tomato-based foods, such as red sauce, chili, salsa, and pizza with red sauce. ? Fried and fatty foods, such as donuts, french fries, potato chips, and high-fat dressings. ? High-fat meats, such as hot dogs and fatty cuts of red and white meats, such as rib eye steak, sausage, ham, and bacon. ? High-fat dairy items, such as whole milk,  butter, and cream cheese.  Eat small, frequent meals instead of large meals.  Avoid drinking large amounts of liquid with your meals.  Avoid eating meals during the 2-3 hours before bedtime.  Avoid lying down right after you eat.  Do not exercise right after you eat. General instructions  Pay attention to any changes in your symptoms.  Take over-the-counter and prescription medicines only as told by your health care provider. Do not take aspirin, ibuprofen, or other NSAIDs unless your health care provider told you to do so.  Do not use any tobacco products, including cigarettes, chewing tobacco, and e-cigarettes. If you need help quitting, ask your health care provider.  Wear loose-fitting clothing. Do not wear anything tight around your waist that causes pressure on your abdomen.  Raise (elevate) the head of your bed about 6 inches (15 cm).  Try to reduce your stress, such as with yoga or meditation. If you need help reducing stress, ask your health care provider.  If you are overweight, reduce your weight to an amount that is healthy for you. Ask your health care provider for guidance about a safe weight loss goal.  Keep all follow-up visits as told by your health care provider. This is important. Contact a health care provider if:  You have new symptoms.  You have unexplained weight loss.  You have difficulty swallowing, or it hurts to swallow.  You have wheezing or a persistent cough.  Your symptoms do not improve  with treatment.  You have frequent heartburn for more than two weeks. Get help right away if:  You have pain in your arms, neck, jaw, teeth, or back.  You feel sweaty, dizzy, or light-headed.  You have chest pain or shortness of breath.  You vomit and your vomit looks like blood or coffee grounds.  Your stool is bloody or black. This information is not intended to replace advice given to you by your health care provider. Make sure you discuss any  questions you have with your health care provider. Document Released: 02/16/2009 Document Revised: 03/07/2016 Document Reviewed: 01/25/2015 Elsevier Interactive Patient Education  2018 [place] for Gastroesophageal Reflux Disease, Adult When you have gastroesophageal reflux disease (GERD), the foods you eat and your eating habits are very important. Choosing the right foods can help ease your discomfort. What guidelines do I need to follow?  Choose fruits, vegetables, whole grains, and low-fat dairy products.  Choose low-fat meat, fish, and poultry.  Limit fats such as oils, salad dressings, butter, nuts, and avocado.  Keep a food diary. This helps you identify foods that cause symptoms.  Avoid foods that cause symptoms. These may be different for everyone.  Eat small meals often instead of 3 large meals a day.  Eat your meals slowly, in a place where you are relaxed.  Limit fried foods.  Cook foods using methods other than frying.  Avoid drinking alcohol.  Avoid drinking large amounts of liquids with your meals.  Avoid bending over or lying down until 2-3 hours after eating. What foods are not recommended? These are some foods and drinks that may make your symptoms worse: Vegetables Tomatoes. Tomato juice. Tomato and spaghetti sauce. Chili peppers. Onion and garlic. Horseradish. Fruits Oranges, grapefruit, and lemon (fruit and juice). Meats High-fat meats, fish, and poultry. This includes hot dogs, ribs, ham, sausage, salami, and bacon. Dairy Whole milk and chocolate milk. Sour cream. Cream. Butter. Ice cream. Cream cheese. Drinks Coffee and tea. Bubbly (carbonated) drinks or energy drinks. Condiments Hot sauce. Barbecue sauce. Sweets/Desserts Chocolate and cocoa. Donuts. Peppermint and spearmint. Fats and Oils High-fat foods. This includes [place] fries and potato chips. Other Vinegar. Strong spices. This includes black pepper, white pepper,  red pepper, cayenne, curry powder, cloves, ginger, and chili powder. The items listed above may not be a complete list of foods and drinks to avoid. Contact your dietitian for more information. This information is not intended to replace advice given to you by your health care provider. Make sure you discuss any questions you have with your health care provider. Document Released: 03/31/2012 Document Revised: 03/07/2016 Document Reviewed: 08/04/2013 Elsevier Interactive Patient Education  2017 [place] you healthy  Get these tests  Blood pressure- Have your blood pressure checked once a year by your healthcare provider.  Normal blood pressure is 120/80  Weight- Have your body mass index (BMI) calculated to screen for obesity.  BMI is a measure of body fat based on height and weight. You can also calculate your own BMI at [email or web address].  Cholesterol- Have your cholesterol checked every year.  Diabetes- Have your blood sugar checked regularly if you have high blood pressure, high cholesterol, have a family history of diabetes or if you are overweight.  Screening for Colon Cancer- Colonoscopy starting at age 57.  Screening may begin sooner depending on your family history and other health conditions. Follow up colonoscopy as directed by your Gastroenterologist.  Screening for Prostate Cancer-  Both blood work (PSA) and a rectal exam help screen for Prostate Cancer.  Screening begins at age 51 with African-American men and at age 40 with Caucasian men.  Screening may begin sooner depending on your family history.  Take these medicines  Aspirin- One aspirin daily can help prevent Heart disease and Stroke.  Flu shot- Every fall.  Tetanus- Every 10 years.  Zostavax- Once after the age of 48 to prevent Shingles.  Pneumonia shot- Once after the age of 36; if you are younger than 8, ask your healthcare provider if you need a Pneumonia shot.  Take these  steps  Don't smoke- If you do smoke, talk to your doctor about quitting.  For tips on how to quit, go to www.smokefree.gov or call 1-800-QUIT-NOW.  Be physically active- Exercise 5 days a week for at least 30 minutes.  If you are not already physically active start slow and gradually work up to 30 minutes of moderate physical activity.  Examples of moderate activity include walking briskly, mowing the yard, dancing, swimming, bicycling, etc.  Eat a healthy diet- Eat a variety of healthy food such as fruits, vegetables, low fat milk, low fat cheese, yogurt, lean meant, poultry, fish, beans, tofu, etc. For more information go to www.thenutritionsource.org  Drink alcohol in moderation- Limit alcohol intake to less than two drinks a day. Never drink and drive.  Dentist- Brush and floss twice daily; visit your dentist twice a year.  Depression- Your emotional health is as important as your physical health. If you're feeling down, or losing interest in things you would normally enjoy please talk to your healthcare provider.  Eye exam- Visit your eye doctor every year.  Safe sex- If you may be exposed to a sexually transmitted infection, use a condom.  Seat belts- Seat belts can save your life; always wear one.  Smoke/Carbon Monoxide detectors- These detectors need to be installed on the appropriate level of your home.  Replace batteries at least once a year.  Skin cancer- When out in the sun, cover up and use sunscreen 15 SPF or higher.  Violence- If anyone is threatening you, please tell your healthcare provider. Living Will/ Health care power of attorney- Speak with your healthcare provider and family.   If you have lab work done today you will be contacted with your lab results within the next 2 weeks.  If you have not heard from [place] then please contact us. The fastest way to get your results is to register for My Chart.   IF you received an x-ray today, you will receive an invoice from  [facility]. Please contact [facility] at [phone] with questions or concerns regarding your invoice.   IF you received labwork today, you will receive an invoice from [facility]. Please contact [facility] at [phone] with questions or concerns regarding your invoice.   Our billing staff will not be able to assist you with questions regarding bills from these companies.  You will be contacted with the lab results as soon as they are available. The fastest way to get your results is to activate your My Chart account. Instructions are located on the last page of this paperwork. If you have not heard from [place] regarding the results in 2 weeks, please contact this office.

## 2018-06-24 NOTE — Progress Notes (Signed)
Subjective:  By signing my name below, I, [name], attest that this documentation has been prepared under the direction and in the presence of [name], MD Electronically Signed: Ladene Artist, ED Scribe 06/24/2018 at 8:51 AM.   Patient ID: Thomas Harding, male    DOB: 1956/04/30, 62 y.o.   MRN: [id number]  Chief Complaint  Patient presents with  . Annual Exam    CPE, cant sleep at night   . positive fall screening    8 weeks ago.     HPI Thomas Harding is a 62 y.o. male who presents to Primary Care at [facility] for an annual exam and other concerns. H/o GERD and hyperlipidemia. Last seen 8/26 for med refills.  Hyperlipidemia Lab Results  Component Value Date   CHOL 134 03/17/2017   HDL 48 03/17/2017   LDLCALC 69 03/17/2017   TRIG 84 03/17/2017   CHOLHDL 2.8 03/17/2017   Lab Results  Component Value Date   ALT 21 03/17/2017   AST 20 03/17/2017   ALKPHOS 98 03/17/2017   BILITOT 1.1 03/17/2017  Had been out of Lipitor x 3 wks when last seen. Some myalgias with Lipitor but were tolerable. Planned on fasting labs off meds to determine if he still had statin need but those have not yet been drawn. - Pt stopped Lipitor over a month ago.  Insomnia Pt reports that he is unable to get comfortable due to R shoulder pain from RTC surgery ~7 wks ago. States pain keeps him up at night. He does have oxycodone at home which he has not taken; occasionally takes Advil.  Heartburn Some occasional heartburn noted more with acid based foods such as tomatoes. Takes TUMs, occasional Pepcid ac.  CA Screening Colonoscopy: GI Dr. Collene Mares. Reported last colonoscopy ~8-10 yrs ago. Prostate CA Screening: No recent PSA. Pt agrees to PSA and DRE today. No results found for: PSA  Immunizations Immunization History  Administered Date(s) Administered  . Tdap 03/15/2017  Shingrix: plans to get it from his son Flu: today Hep C and HIV screening: on blood work today  Depression  Screening Depression screen [facility] 2/9 06/24/2018 06/24/2018 06/08/2018 08/07/2017 03/19/2017  Decreased Interest 0 0 0 0 0  Down, Depressed, Hopeless 0 0 0 0 0  PHQ - 2 Score 0 0 0 0 0  Altered sleeping - - - - 0  Tired, decreased energy - - - - 0  Change in appetite - - - - 0  Feeling bad or failure about yourself  - - - - 0  Trouble concentrating - - - - 0  Moving slowly or fidgety/restless - - - - 0  Suicidal thoughts - - - - 0  PHQ-9 Score - - - - 0   Fall Screening Reported a fall 8 wks ago. - Pt states that he tripped over loose gravel while walking in a parking lot. Reports falling on his R shoulder which required RTC surgery 7 wks ago. Followed by [facility].   Visual Acuity Screening   Right eye Left eye Both eyes  Without correction: 20/50-1 20/40 20/30  With correction:      Vision: annually Dentist: last visit was 1.5 yr ago Exercise: walks/runs less than 1 mile/day currently due to R shoulder pain  Patient Active Problem List   Diagnosis Date Noted  . Shortness of breath 11/22/2016   Past Medical History:  Diagnosis Date  . Hyperlipidemia    History reviewed. No pertinent surgical history. Allergies  Allergen Reactions  . Bee Venom Anaphylaxis  . Penicillins Hives and Swelling   Prior to Admission medications   Medication Sig Start Date End Date Taking? Authorizing Provider  atorvastatin (LIPITOR) 10 MG tablet Take 1 tablet (10 mg total) by mouth daily. 03/17/17  Yes [name], MD  omeprazole (PRILOSEC OTC) 20 MG tablet Take 20 mg by mouth daily.   Yes [provider]   Social History   Socioeconomic History  . Marital status: Single    Spouse name: Not on file  . Number of children: Not on file  . Years of education: Not on file  . Highest education level: Not on file  Occupational History  . Not on file  Social Needs  . Financial resource strain: Not on file  . Food insecurity:    Worry: Not on file    Inability: Not on file  .  Transportation needs:    Medical: Not on file    Non-medical: Not on file  Tobacco Use  . Smoking status: Never Smoker  . Smokeless tobacco: Never Used  Substance and Sexual Activity  . Alcohol use: Yes    Comment: 2 per month  . Drug use: No  . Sexual activity: Yes  Lifestyle  . Physical activity:    Days per week: Not on file    Minutes per session: Not on file  . Stress: Not on file  Relationships  . Social connections:    Talks on phone: Not on file    Gets together: Not on file    Attends religious service: Not on file    Active member of club or organization: Not on file    Attends meetings of clubs or organizations: Not on file    Relationship status: Not on file  . Intimate partner violence:    Fear of current or ex partner: Not on file    Emotionally abused: Not on file    Physically abused: Not on file    Forced sexual activity: Not on file  Other Topics Concern  . Not on file  Social History Narrative  . Not on file   Review of Systems  Musculoskeletal: Positive for arthralgias.  Psychiatric/Behavioral: Positive for sleep disturbance.      Objective:   Physical Exam  Constitutional: He is oriented to person, place, and time. He appears well-developed and well-nourished.  HENT:  Head: Normocephalic and atraumatic.  Right Ear: External ear normal.  Left Ear: External ear normal.  Mouth/Throat: Oropharynx is clear and moist.  Eyes: Pupils are equal, round, and reactive to light. Conjunctivae and EOM are normal.  Neck: Normal range of motion. Neck supple. No thyromegaly present.  Cardiovascular: Normal rate, regular rhythm, normal heart sounds and intact distal pulses.  Pulmonary/Chest: Effort normal and breath sounds normal. No respiratory distress. He has no wheezes.  Abdominal: Soft. He exhibits no distension. There is no tenderness. Hernia confirmed negative in the right inguinal area and confirmed negative in the left inguinal area.  Genitourinary:  Prostate normal.  Musculoskeletal: Normal range of motion. He exhibits no edema or tenderness.  Lymphadenopathy:    He has no cervical adenopathy.  Neurological: He is alert and oriented to person, place, and time. He has normal reflexes.  Skin: Skin is warm and dry.  Psychiatric: He has a normal mood and affect. His behavior is normal.  Vitals reviewed.    Vitals:   06/24/18 0817  BP: 126/83  Pulse: 66  Temp: 98.9 F (  37.2 C)  TempSrc: Oral  SpO2: 97%  Weight: 228 lb 6.4 oz (103.6 kg)  Height: 5\' 11"  (1.803 m)      Assessment & Plan:   Thomas Harding is a 62 y.o. male Annual physical exam  - -anticipatory guidance as below in AVS, screening labs above. Health maintenance items as above in HPI discussed/recommended as applicable.   Screening for prostate cancer - Plan: PSA  - We discussed pros and cons of prostate cancer screening, and after this discussion, he chose to have screening done. PSA obtained, and no concerning findings on DRE.   Needs flu shot - Plan: Flu Vaccine QUAD 36+ mos IM  Episodic heartburn, over-the-counter treatments okay for now, handout given on foods to avoid, plans to return to discuss further at separate visit  No orders of the defined types were placed in this encounter.  Patient Instructions   Call your gastroenterologist to see if you are due for colonoscopy and let me know the last date of colonoscopy.   Shingles vaccine can be given at your pharmacy. Flu shot today.   Depending on lipid readings, can decide on meds.   Tylenol if needed for pain to see if that will let you sleep better. You can also use your other pain medication if needed, and can discuss with Dr. Theda Sers if persistent pain.   See foods to avoid for heartburn, occasional pepcid ok. Please follow up to discuss further in next few weeks.   Thank you for coming in today.   Heartburn Heartburn is a type of pain or discomfort that can happen in the throat or chest. It is  often described as a burning pain. It may also cause a bad taste in the mouth. Heartburn may feel worse when you lie down or bend over, and it is often worse at night. Heartburn may be caused by stomach contents that move back up into the esophagus (reflux). Follow these instructions at home: Take these actions to decrease your discomfort and to help avoid complications. Diet  Follow a diet as recommended by your health care provider. This may involve avoiding foods and drinks such as: ? Coffee and tea (with or without caffeine). ? Drinks that contain alcohol. ? Energy drinks and sports drinks. ? Carbonated drinks or sodas. ? Chocolate and cocoa. ? Peppermint and mint flavorings. ? Garlic and onions. ? Horseradish. ? Spicy and acidic foods, including peppers, chili powder, curry powder, vinegar, hot sauces, and barbecue sauce. ? Citrus fruit juices and citrus fruits, such as oranges, lemons, and limes. ? Tomato-based foods, such as red sauce, chili, salsa, and pizza with red sauce. ? Fried and fatty foods, such as donuts, french fries, potato chips, and high-fat dressings. ? High-fat meats, such as hot dogs and fatty cuts of red and white meats, such as rib eye steak, sausage, ham, and bacon. ? High-fat dairy items, such as whole milk, butter, and cream cheese.  Eat small, frequent meals instead of large meals.  Avoid drinking large amounts of liquid with your meals.  Avoid eating meals during the 2-3 hours before bedtime.  Avoid lying down right after you eat.  Do not exercise right after you eat. General instructions  Pay attention to any changes in your symptoms.  Take over-the-counter and prescription medicines only as told by your health care provider. Do not take aspirin, ibuprofen, or other NSAIDs unless your health care provider told you to do so.  Do not use any tobacco products,  including cigarettes, chewing tobacco, and e-cigarettes. If you need help quitting, ask your  health care provider.  Wear loose-fitting clothing. Do not wear anything tight around your waist that causes pressure on your abdomen.  Raise (elevate) the head of your bed about 6 inches (15 cm).  Try to reduce your stress, such as with yoga or meditation. If you need help reducing stress, ask your health care provider.  If you are overweight, reduce your weight to an amount that is healthy for you. Ask your health care provider for guidance about a safe weight loss goal.  Keep all follow-up visits as told by your health care provider. This is important. Contact a health care provider if:  You have new symptoms.  You have unexplained weight loss.  You have difficulty swallowing, or it hurts to swallow.  You have wheezing or a persistent cough.  Your symptoms do not improve with treatment.  You have frequent heartburn for more than two weeks. Get help right away if:  You have pain in your arms, neck, jaw, teeth, or back.  You feel sweaty, dizzy, or light-headed.  You have chest pain or shortness of breath.  You vomit and your vomit looks like blood or coffee grounds.  Your stool is bloody or black. This information is not intended to replace advice given to you by your health care provider. Make sure you discuss any questions you have with your health care provider. Document Released: 02/16/2009 Document Revised: 03/07/2016 Document Reviewed: 01/25/2015 Elsevier Interactive Patient Education  2018 [place] for Gastroesophageal Reflux Disease, Adult When you have gastroesophageal reflux disease (GERD), the foods you eat and your eating habits are very important. Choosing the right foods can help ease your discomfort. What guidelines do I need to follow?  Choose fruits, vegetables, whole grains, and low-fat dairy products.  Choose low-fat meat, fish, and poultry.  Limit fats such as oils, salad dressings, butter, nuts, and avocado.  Keep a food  diary. This helps you identify foods that cause symptoms.  Avoid foods that cause symptoms. These may be different for everyone.  Eat small meals often instead of 3 large meals a day.  Eat your meals slowly, in a place where you are relaxed.  Limit fried foods.  Cook foods using methods other than frying.  Avoid drinking alcohol.  Avoid drinking large amounts of liquids with your meals.  Avoid bending over or lying down until 2-3 hours after eating. What foods are not recommended? These are some foods and drinks that may make your symptoms worse: Vegetables Tomatoes. Tomato juice. Tomato and spaghetti sauce. Chili peppers. Onion and garlic. Horseradish. Fruits Oranges, grapefruit, and lemon (fruit and juice). Meats High-fat meats, fish, and poultry. This includes hot dogs, ribs, ham, sausage, salami, and bacon. Dairy Whole milk and chocolate milk. Sour cream. Cream. Butter. Ice cream. Cream cheese. Drinks Coffee and tea. Bubbly (carbonated) drinks or energy drinks. Condiments Hot sauce. Barbecue sauce. Sweets/Desserts Chocolate and cocoa. Donuts. Peppermint and spearmint. Fats and Oils High-fat foods. This includes [place] fries and potato chips. Other Vinegar. Strong spices. This includes black pepper, white pepper, red pepper, cayenne, curry powder, cloves, ginger, and chili powder. The items listed above may not be a complete list of foods and drinks to avoid. Contact your dietitian for more information. This information is not intended to replace advice given to you by your health care provider. Make sure you discuss any questions you have with your health care  provider. Document Released: 03/31/2012 Document Revised: 03/07/2016 Document Reviewed: 08/04/2013 Elsevier Interactive Patient Education  2017 [place] you healthy  Get these tests  Blood pressure- Have your blood pressure checked once a year by your healthcare provider.  Normal blood pressure  is 120/80  Weight- Have your body mass index (BMI) calculated to screen for obesity.  BMI is a measure of body fat based on height and weight. You can also calculate your own BMI at [employer].  Cholesterol- Have your cholesterol checked every year.  Diabetes- Have your blood sugar checked regularly if you have high blood pressure, high cholesterol, have a family history of diabetes or if you are overweight.  Screening for Colon Cancer- Colonoscopy starting at age 63.  Screening may begin sooner depending on your family history and other health conditions. Follow up colonoscopy as directed by your Gastroenterologist.  Screening for Prostate Cancer- Both blood work (PSA) and a rectal exam help screen for Prostate Cancer.  Screening begins at age 18 with African-American men and at age 44 with Caucasian men.  Screening may begin sooner depending on your family history.  Take these medicines  Aspirin- One aspirin daily can help prevent Heart disease and Stroke.  Flu shot- Every fall.  Tetanus- Every 10 years.  Zostavax- Once after the age of 53 to prevent Shingles.  Pneumonia shot- Once after the age of 8; if you are younger than 63, ask your healthcare provider if you need a Pneumonia shot.  Take these steps  Don't smoke- If you do smoke, talk to your doctor about quitting.  For tips on how to quit, go to www.smokefree.gov or call 1-800-QUIT-NOW.  Be physically active- Exercise 5 days a week for at least 30 minutes.  If you are not already physically active start slow and gradually work up to 30 minutes of moderate physical activity.  Examples of moderate activity include walking briskly, mowing the yard, dancing, swimming, bicycling, etc.  Eat a healthy diet- Eat a variety of healthy food such as fruits, vegetables, low fat milk, low fat cheese, yogurt, lean meant, poultry, fish, beans, tofu, etc. For more information go to www.thenutritionsource.org  Drink alcohol in  moderation- Limit alcohol intake to less than two drinks a day. Never drink and drive.  Dentist- Brush and floss twice daily; visit your dentist twice a year.  Depression- Your emotional health is as important as your physical health. If you're feeling down, or losing interest in things you would normally enjoy please talk to your healthcare provider.  Eye exam- Visit your eye doctor every year.  Safe sex- If you may be exposed to a sexually transmitted infection, use a condom.  Seat belts- Seat belts can save your life; always wear one.  Smoke/Carbon Monoxide detectors- These detectors need to be installed on the appropriate level of your home.  Replace batteries at least once a year.  Skin cancer- When out in the sun, cover up and use sunscreen 15 SPF or higher.  Violence- If anyone is threatening you, please tell your healthcare provider. Living Will/ Health care power of attorney- Speak with your healthcare provider and family.   If you have lab work done today you will be contacted with your lab results within the next 2 weeks.  If you have not heard from [place] then please contact us. The fastest way to get your results is to register for My Chart.   IF you received an x-ray today, you will receive an  invoice from [facility]. Please contact [facility] at [phone] with questions or concerns regarding your invoice.   IF you received labwork today, you will receive an invoice from [facility]. Please contact [facility] at [phone] with questions or concerns regarding your invoice.   Our billing staff will not be able to assist you with questions regarding bills from these companies.  You will be contacted with the lab results as soon as they are available. The fastest way to get your results is to activate your My Chart account. Instructions are located on the last page of this paperwork. If you have not heard from [place] regarding the results in 2 weeks, please contact  this office.       I personally performed the services described in this documentation, which was scribed in my presence. The recorded information has been reviewed and considered for accuracy and completeness, addended by me as needed, and agree with information above.  Signed,   Merri Ray, MD [facility] at [place].  06/25/18 8:36 AM

## 2018-06-25 DIAGNOSIS — M25511 Pain in right shoulder: Secondary | ICD-10-CM | POA: Diagnosis not present

## 2018-06-25 LAB — PSA: Prostate Specific Ag, Serum: 1.1 ng/mL (ref 0.0–4.0)

## 2018-06-29 DIAGNOSIS — M25511 Pain in right shoulder: Secondary | ICD-10-CM | POA: Diagnosis not present

## 2018-07-02 DIAGNOSIS — M25511 Pain in right shoulder: Secondary | ICD-10-CM | POA: Diagnosis not present

## 2018-07-06 DIAGNOSIS — M25511 Pain in right shoulder: Secondary | ICD-10-CM | POA: Diagnosis not present

## 2018-07-08 ENCOUNTER — Encounter: Payer: Self-pay | Admitting: Family Medicine

## 2018-07-09 DIAGNOSIS — M25511 Pain in right shoulder: Secondary | ICD-10-CM | POA: Diagnosis not present

## 2018-07-13 ENCOUNTER — Encounter: Payer: Self-pay | Admitting: Family Medicine

## 2018-07-14 DIAGNOSIS — M25511 Pain in right shoulder: Secondary | ICD-10-CM | POA: Diagnosis not present

## 2018-07-16 DIAGNOSIS — M25511 Pain in right shoulder: Secondary | ICD-10-CM | POA: Diagnosis not present

## 2018-07-20 DIAGNOSIS — M25511 Pain in right shoulder: Secondary | ICD-10-CM | POA: Diagnosis not present

## 2018-07-23 DIAGNOSIS — M25511 Pain in right shoulder: Secondary | ICD-10-CM | POA: Diagnosis not present

## 2018-07-27 DIAGNOSIS — M25511 Pain in right shoulder: Secondary | ICD-10-CM | POA: Diagnosis not present

## 2018-08-03 DIAGNOSIS — M25511 Pain in right shoulder: Secondary | ICD-10-CM | POA: Diagnosis not present

## 2018-08-06 DIAGNOSIS — M25511 Pain in right shoulder: Secondary | ICD-10-CM | POA: Diagnosis not present

## 2018-08-10 DIAGNOSIS — M25511 Pain in right shoulder: Secondary | ICD-10-CM | POA: Diagnosis not present

## 2018-08-13 DIAGNOSIS — M25511 Pain in right shoulder: Secondary | ICD-10-CM | POA: Diagnosis not present

## 2018-08-20 DIAGNOSIS — M25511 Pain in right shoulder: Secondary | ICD-10-CM | POA: Diagnosis not present

## 2018-08-24 DIAGNOSIS — Z9889 Other specified postprocedural states: Secondary | ICD-10-CM | POA: Diagnosis not present

## 2018-08-24 DIAGNOSIS — M25511 Pain in right shoulder: Secondary | ICD-10-CM | POA: Diagnosis not present

## 2018-08-25 DIAGNOSIS — M25511 Pain in right shoulder: Secondary | ICD-10-CM | POA: Diagnosis not present

## 2018-09-03 DIAGNOSIS — Z1211 Encounter for screening for malignant neoplasm of colon: Secondary | ICD-10-CM | POA: Diagnosis not present

## 2018-09-03 DIAGNOSIS — E669 Obesity, unspecified: Secondary | ICD-10-CM | POA: Diagnosis not present

## 2018-09-03 DIAGNOSIS — K219 Gastro-esophageal reflux disease without esophagitis: Secondary | ICD-10-CM | POA: Diagnosis not present

## 2018-09-03 DIAGNOSIS — Z86010 Personal history of colonic polyps: Secondary | ICD-10-CM | POA: Diagnosis not present

## 2018-09-03 DIAGNOSIS — M25511 Pain in right shoulder: Secondary | ICD-10-CM | POA: Diagnosis not present

## 2018-09-07 DIAGNOSIS — M25511 Pain in right shoulder: Secondary | ICD-10-CM | POA: Diagnosis not present

## 2018-09-24 DIAGNOSIS — M25511 Pain in right shoulder: Secondary | ICD-10-CM | POA: Diagnosis not present

## 2018-10-13 ENCOUNTER — Encounter: Payer: Self-pay | Admitting: Family Medicine

## 2018-10-13 DIAGNOSIS — K5730 Diverticulosis of large intestine without perforation or abscess without bleeding: Secondary | ICD-10-CM | POA: Diagnosis not present

## 2018-10-13 DIAGNOSIS — Z1211 Encounter for screening for malignant neoplasm of colon: Secondary | ICD-10-CM | POA: Diagnosis not present

## 2018-10-13 DIAGNOSIS — K635 Polyp of colon: Secondary | ICD-10-CM | POA: Diagnosis not present

## 2018-10-13 DIAGNOSIS — D120 Benign neoplasm of cecum: Secondary | ICD-10-CM | POA: Diagnosis not present

## 2018-12-01 DIAGNOSIS — H52203 Unspecified astigmatism, bilateral: Secondary | ICD-10-CM | POA: Diagnosis not present

## 2018-12-01 DIAGNOSIS — H5203 Hypermetropia, bilateral: Secondary | ICD-10-CM | POA: Diagnosis not present

## 2018-12-01 DIAGNOSIS — H2513 Age-related nuclear cataract, bilateral: Secondary | ICD-10-CM | POA: Diagnosis not present

## 2018-12-01 DIAGNOSIS — H524 Presbyopia: Secondary | ICD-10-CM | POA: Diagnosis not present

## 2019-02-15 DIAGNOSIS — L570 Actinic keratosis: Secondary | ICD-10-CM | POA: Diagnosis not present

## 2019-02-15 DIAGNOSIS — D229 Melanocytic nevi, unspecified: Secondary | ICD-10-CM | POA: Diagnosis not present

## 2019-02-15 DIAGNOSIS — L723 Sebaceous cyst: Secondary | ICD-10-CM | POA: Diagnosis not present

## 2019-07-22 ENCOUNTER — Ambulatory Visit: Payer: BLUE CROSS/BLUE SHIELD

## 2019-09-06 ENCOUNTER — Other Ambulatory Visit: Payer: Self-pay

## 2019-09-06 DIAGNOSIS — Z20822 Contact with and (suspected) exposure to covid-19: Secondary | ICD-10-CM

## 2019-09-07 LAB — NOVEL CORONAVIRUS, NAA: SARS-CoV-2, NAA: NOT DETECTED

## 2019-12-03 DIAGNOSIS — H5203 Hypermetropia, bilateral: Secondary | ICD-10-CM | POA: Diagnosis not present

## 2019-12-03 DIAGNOSIS — H52203 Unspecified astigmatism, bilateral: Secondary | ICD-10-CM | POA: Diagnosis not present

## 2019-12-03 DIAGNOSIS — H524 Presbyopia: Secondary | ICD-10-CM | POA: Diagnosis not present

## 2019-12-03 DIAGNOSIS — H2513 Age-related nuclear cataract, bilateral: Secondary | ICD-10-CM | POA: Diagnosis not present

## 2019-12-11 DIAGNOSIS — Z23 Encounter for immunization: Secondary | ICD-10-CM | POA: Diagnosis not present

## 2020-01-08 DIAGNOSIS — Z23 Encounter for immunization: Secondary | ICD-10-CM | POA: Diagnosis not present

## 2020-03-20 DIAGNOSIS — M5417 Radiculopathy, lumbosacral region: Secondary | ICD-10-CM | POA: Diagnosis not present

## 2020-03-20 DIAGNOSIS — M545 Low back pain: Secondary | ICD-10-CM | POA: Diagnosis not present

## 2020-03-27 DIAGNOSIS — M545 Low back pain: Secondary | ICD-10-CM | POA: Diagnosis not present

## 2020-03-29 DIAGNOSIS — M545 Low back pain: Secondary | ICD-10-CM | POA: Diagnosis not present

## 2020-03-31 DIAGNOSIS — M545 Low back pain: Secondary | ICD-10-CM | POA: Diagnosis not present

## 2020-03-31 DIAGNOSIS — M5136 Other intervertebral disc degeneration, lumbar region: Secondary | ICD-10-CM | POA: Diagnosis not present

## 2020-04-13 DIAGNOSIS — M5136 Other intervertebral disc degeneration, lumbar region: Secondary | ICD-10-CM | POA: Diagnosis not present

## 2020-04-20 DIAGNOSIS — M5136 Other intervertebral disc degeneration, lumbar region: Secondary | ICD-10-CM | POA: Diagnosis not present

## 2020-04-20 DIAGNOSIS — M5416 Radiculopathy, lumbar region: Secondary | ICD-10-CM | POA: Diagnosis not present

## 2020-04-20 DIAGNOSIS — M48061 Spinal stenosis, lumbar region without neurogenic claudication: Secondary | ICD-10-CM | POA: Diagnosis present

## 2020-04-20 DIAGNOSIS — M519 Unspecified thoracic, thoracolumbar and lumbosacral intervertebral disc disorder: Secondary | ICD-10-CM | POA: Diagnosis not present

## 2020-04-20 DIAGNOSIS — M545 Low back pain: Secondary | ICD-10-CM | POA: Diagnosis not present

## 2020-04-21 DIAGNOSIS — M5416 Radiculopathy, lumbar region: Secondary | ICD-10-CM | POA: Diagnosis not present

## 2020-04-26 DIAGNOSIS — M5416 Radiculopathy, lumbar region: Secondary | ICD-10-CM | POA: Diagnosis not present

## 2020-05-02 ENCOUNTER — Ambulatory Visit: Payer: Self-pay | Admitting: Orthopedic Surgery

## 2020-05-02 NOTE — H&P (Signed)
Thomas Harding is an 64 y.o. male.   Chief Complaint: back and left leg pain HPI: Reason for Visit: left low back Context: The patient is 5 weeks out from the onset of symptoms Location (Lower Extremity): lower back pain on the left; foot pain on the left, , ; left lower leg Severity: pain level 4/10 Quality: sharp; aching Aggravating Factors: standing for long periods of time; walking for long periods of time; lying down Alleviating Factors: Relief when sitting in his truck Associated Symptoms: numbness/tingling; weakness (LLE) Medications: The patient is taking Gabapentin 300mg  1 qid Notes: The patient is 7 days following a left L4 SNRB.Marland Kitchen He states that it helped for 24 hours. 24 hours of relief from a nerve root block.  Past Medical History:  Diagnosis Date  . Hyperlipidemia     No past surgical history on file.  No family history on file. Social History:  reports that he has never smoked. He has never used smokeless tobacco. He reports current alcohol use. He reports that he does not use drugs.  Allergies:  Allergies  Allergen Reactions  . Bee Venom Anaphylaxis  . Penicillins Hives and Swelling    (Not in a hospital admission)   No results found for this or any previous visit (from the past 48 hour(s)). No results found.  Review of Systems  Constitutional: Negative.   HENT: Negative.   Eyes: Negative.   Respiratory: Negative.   Cardiovascular: Negative.   Gastrointestinal: Negative.   Endocrine: Negative.   Genitourinary: Negative.   Musculoskeletal: Positive for arthralgias and back pain.  Neurological: Positive for weakness and numbness.    There were no vitals taken for this visit. Physical Exam Constitutional:      Appearance: Normal appearance. He is normal weight.  HENT:     Head: Normocephalic.     Right Ear: External ear normal.     Left Ear: External ear normal.     Nose: Nose normal.     Mouth/Throat:     Mouth: Mucous membranes are moist.   Eyes:     Extraocular Movements: Extraocular movements intact.  Cardiovascular:     Rate and Rhythm: Normal rate and regular rhythm.     Pulses: Normal pulses.     Heart sounds: Normal heart sounds.  Pulmonary:     Effort: Pulmonary effort is normal.     Breath sounds: Normal breath sounds.  Abdominal:     General: Abdomen is flat.  Musculoskeletal:     Cervical back: Normal range of motion.     Comments: Patient is a 64 year old male.  Gait and Station: Appearance: ambulating with no assistive devices and antalgic gait.  Constitutional: General Appearance: healthy-appearing and distress (mild).  Psychiatric: Mood and Affect: active and alert.  Cardiovascular System: Edema Right: none; Dorsalis and posterior tibial pulses 2+. Edema Left: none.  Abdomen: Inspection and Palpation: non-distended and no tenderness.  Skin: Inspection and palpation: no rash.  Lumbar Spine: Inspection: normal alignment. Bony Palpation of the Lumbar Spine: tender at lumbosacral junction.. Bony Palpation of the Right Hip: no tenderness of the greater trochanter and tenderness of the SI joint; Pelvis stable. Bony Palpation of the Left Hip: no tenderness of the greater trochanter and tenderness of the SI joint. Soft Tissue Palpation on the Right: No flank pain with percussion. Active Range of Motion: limited flexion and extention.  Motor Strength: L1 Motor Strength on the Right: hip flexion iliopsoas 5/5. L1 Motor Strength on the Left: hip flexion iliopsoas  5/5. L2-L4 Motor Strength on the Right: knee extension quadriceps 5/5. L2-L4 Motor Strength on the Left: knee extension quadriceps 5/5. L5 Motor Strength on the Right: ankle dorsiflexion tibialis anterior 5/5 and great toe extension extensor hallucis longus 5/5. L5 Motor Strength on the Left: ankle dorsiflexion tibialis anterior 5/5 and great toe extension extensor hallucis longus 5/5. S1 Motor Strength on the Right: plantar flexion gastrocnemius 5/5. S1 Motor  Strength on the Left: plantar flexion gastrocnemius 5/5.  Neurological System: Knee Reflex Right: normal (2). Knee Reflex Left: normal (2). Ankle Reflex Right: normal (2). Ankle Reflex Left: normal (2). Babinski Reflex Right: plantar reflex absent. Babinski Reflex Left: plantar reflex absent. Sensation on the Right: normal distal extremities. Sensation on the Left: normal distal extremities. Special Tests on the Right: no clonus of the ankle/knee. Special Tests on the Left: no clonus of the ankle/knee and seated straight leg raising test positive.  Is a having difficulty heel walking on the left. Slightly altered sensation in L5 dermatome.  Neurological:     Mental Status: He is alert.    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 He did not have pedicle on pedicle compression but he does have a small osteophyte off the superior articulating process S1.  Pain and the dysfunction is in the L5 nerve root distribution. 24 hours of relief following a L5 selective nerve root block. Patient has no back pain.  Plan:  Given that he is close to 6 weeks of symptomatology with persistent radiculopathy and a minor nerve deficit we discussed options including surgical. Namely a foraminotomy L5 on the left versus foraminotomy and direct by spinal fusion.  Patient has no back pain.  Foraminotomies would include partial facetectomy. In removing the spur off the superior articulating process of S1.  He is to avoid extension and right lateral flexion.  Referral to physical therapy Cleda Mccreedy to determine whether he gets any centralization and benefit by that.  If not over the next few sessions we discussed proceeding with foraminotomy of L5 on the left without a fusion. But I did indicate to him that it very well may not allow recovery of the nerve root or subsequently require a instrumented interbody fusion. He understands that. Again in the absence of any back pain and not pedicle on pedicle apposition of foraminotomies certainly is an option.  We will continue with his gabapentin.  We will place him another steroid Dosepak  A prescription for a steroid dose pack was given to reduce inflammation. 5 mg tablets to be taken over a 6 a day period of time in decreasing doses. The packaging provides guidance. In addition a refill was given to be taken if persistent or recurrent pain is present. Maximum number of Sterapred dose packs in a year's period of time is approximately three. Any anti-inflammatory medication should be discontinued while using this dose pack But can be resumed following its completion. Side effects may include fluid retention, insomnia, and hyperactivity.  Since last appointment, patient called in and spoke with Dr. Tonita Cong and wants to proceed with surgery for ongoing symptoms. We discussed the procedure itself as well as risks, complications and alternatives including but not limited to DVT, PE, failure of  procedure, need for secondary procedure, even death. He desires to proceed.   Plan microlumbar decompression L5-S1 left  Cecilie Kicks, PA-C for Dr. Tonita Cong 05/02/2020, 12:37 PM

## 2020-05-02 NOTE — H&P (View-Only) (Signed)
Thomas Harding is an 64 y.o. male.   Chief Complaint: back and left leg pain HPI: Reason for Visit: left low back Context: The patient is 5 weeks out from the onset of symptoms Location (Lower Extremity): lower back pain on the left; foot pain on the left, , ; left lower leg Severity: pain level 4/10 Quality: sharp; aching Aggravating Factors: standing for long periods of time; walking for long periods of time; lying down Alleviating Factors: Relief when sitting in his truck Associated Symptoms: numbness/tingling; weakness (LLE) Medications: The patient is taking Gabapentin 300mg  1 qid Notes: The patient is 7 days following a left L4 SNRB.Marland Kitchen He states that it helped for 24 hours. 24 hours of relief from a nerve root block.  Past Medical History:  Diagnosis Date  . Hyperlipidemia     No past surgical history on file.  No family history on file. Social History:  reports that he has never smoked. He has never used smokeless tobacco. He reports current alcohol use. He reports that he does not use drugs.  Allergies:  Allergies  Allergen Reactions  . Bee Venom Anaphylaxis  . Penicillins Hives and Swelling    (Not in a hospital admission)   No results found for this or any previous visit (from the past 48 hour(s)). No results found.  Review of Systems  Constitutional: Negative.   HENT: Negative.   Eyes: Negative.   Respiratory: Negative.   Cardiovascular: Negative.   Gastrointestinal: Negative.   Endocrine: Negative.   Genitourinary: Negative.   Musculoskeletal: Positive for arthralgias and back pain.  Neurological: Positive for weakness and numbness.    There were no vitals taken for this visit. Physical Exam Constitutional:      Appearance: Normal appearance. He is normal weight.  HENT:     Head: Normocephalic.     Right Ear: External ear normal.     Left Ear: External ear normal.     Nose: Nose normal.     Mouth/Throat:     Mouth: Mucous membranes are moist.   Eyes:     Extraocular Movements: Extraocular movements intact.  Cardiovascular:     Rate and Rhythm: Normal rate and regular rhythm.     Pulses: Normal pulses.     Heart sounds: Normal heart sounds.  Pulmonary:     Effort: Pulmonary effort is normal.     Breath sounds: Normal breath sounds.  Abdominal:     General: Abdomen is flat.  Musculoskeletal:     Cervical back: Normal range of motion.     Comments: Patient is a 64 year old male.  Gait and Station: Appearance: ambulating with no assistive devices and antalgic gait.  Constitutional: General Appearance: healthy-appearing and distress (mild).  Psychiatric: Mood and Affect: active and alert.  Cardiovascular System: Edema Right: none; Dorsalis and posterior tibial pulses 2+. Edema Left: none.  Abdomen: Inspection and Palpation: non-distended and no tenderness.  Skin: Inspection and palpation: no rash.  Lumbar Spine: Inspection: normal alignment. Bony Palpation of the Lumbar Spine: tender at lumbosacral junction.. Bony Palpation of the Right Hip: no tenderness of the greater trochanter and tenderness of the SI joint; Pelvis stable. Bony Palpation of the Left Hip: no tenderness of the greater trochanter and tenderness of the SI joint. Soft Tissue Palpation on the Right: No flank pain with percussion. Active Range of Motion: limited flexion and extention.  Motor Strength: L1 Motor Strength on the Right: hip flexion iliopsoas 5/5. L1 Motor Strength on the Left: hip flexion iliopsoas  5/5. L2-L4 Motor Strength on the Right: knee extension quadriceps 5/5. L2-L4 Motor Strength on the Left: knee extension quadriceps 5/5. L5 Motor Strength on the Right: ankle dorsiflexion tibialis anterior 5/5 and great toe extension extensor hallucis longus 5/5. L5 Motor Strength on the Left: ankle dorsiflexion tibialis anterior 5/5 and great toe extension extensor hallucis longus 5/5. S1 Motor Strength on the Right: plantar flexion gastrocnemius 5/5. S1 Motor  Strength on the Left: plantar flexion gastrocnemius 5/5.  Neurological System: Knee Reflex Right: normal (2). Knee Reflex Left: normal (2). Ankle Reflex Right: normal (2). Ankle Reflex Left: normal (2). Babinski Reflex Right: plantar reflex absent. Babinski Reflex Left: plantar reflex absent. Sensation on the Right: normal distal extremities. Sensation on the Left: normal distal extremities. Special Tests on the Right: no clonus of the ankle/knee. Special Tests on the Left: no clonus of the ankle/knee and seated straight leg raising test positive.  Is a having difficulty heel walking on the left. Slightly altered sensation in L5 dermatome.  Neurological:     Mental Status: He is alert.    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 He did not have pedicle on pedicle compression but he does have a small osteophyte off the superior articulating process S1.  Pain and the dysfunction is in the L5 nerve root distribution. 24 hours of relief following a L5 selective nerve root block. Patient has no back pain.  Plan:  Given that he is close to 6 weeks of symptomatology with persistent radiculopathy and a minor nerve deficit we discussed options including surgical. Namely a foraminotomy L5 on the left versus foraminotomy and direct by spinal fusion.  Patient has no back pain.  Foraminotomies would include partial facetectomy. In removing the spur off the superior articulating process of S1.  He is to avoid extension and right lateral flexion.  Referral to physical therapy Cleda Mccreedy to determine whether he gets any centralization and benefit by that.  If not over the next few sessions we discussed proceeding with foraminotomy of L5 on the left without a fusion. But I did indicate to him that it very well may not allow recovery of the nerve root or subsequently require a instrumented interbody fusion. He understands that. Again in the absence of any back pain and not pedicle on pedicle apposition of foraminotomies certainly is an option.  We will continue with his gabapentin.  We will place him another steroid Dosepak  A prescription for a steroid dose pack was given to reduce inflammation. 5 mg tablets to be taken over a 6 a day period of time in decreasing doses. The packaging provides guidance. In addition a refill was given to be taken if persistent or recurrent pain is present. Maximum number of Sterapred dose packs in a year's period of time is approximately three. Any anti-inflammatory medication should be discontinued while using this dose pack But can be resumed following its completion. Side effects may include fluid retention, insomnia, and hyperactivity.  Since last appointment, patient called in and spoke with Dr. Tonita Cong and wants to proceed with surgery for ongoing symptoms. We discussed the procedure itself as well as risks, complications and alternatives including but not limited to DVT, PE, failure of  procedure, need for secondary procedure, even death. He desires to proceed.   Plan microlumbar decompression L5-S1 left  Cecilie Kicks, PA-C for Dr. Tonita Cong 05/02/2020, 12:37 PM

## 2020-05-09 ENCOUNTER — Encounter (HOSPITAL_COMMUNITY)
Admission: RE | Admit: 2020-05-09 | Discharge: 2020-05-09 | Disposition: A | Discharge status: Home / Self Care | Payer: BLUE CROSS/BLUE SHIELD | Source: Ambulatory Visit | Attending: Specialist | Admitting: Specialist

## 2020-05-09 ENCOUNTER — Other Ambulatory Visit: Payer: Self-pay

## 2020-05-09 ENCOUNTER — Encounter (HOSPITAL_COMMUNITY): Payer: Self-pay

## 2020-05-09 ENCOUNTER — Ambulatory Visit (HOSPITAL_COMMUNITY)
Admission: RE | Admit: 2020-05-09 | Discharge: 2020-05-09 | Disposition: A | Discharge status: Home / Self Care | Payer: BLUE CROSS/BLUE SHIELD | Source: Ambulatory Visit | Attending: Orthopedic Surgery | Admitting: Orthopedic Surgery

## 2020-05-09 DIAGNOSIS — M5416 Radiculopathy, lumbar region: Secondary | ICD-10-CM | POA: Diagnosis not present

## 2020-05-09 DIAGNOSIS — M5126 Other intervertebral disc displacement, lumbar region: Secondary | ICD-10-CM | POA: Insufficient documentation

## 2020-05-09 DIAGNOSIS — M47816 Spondylosis without myelopathy or radiculopathy, lumbar region: Secondary | ICD-10-CM | POA: Diagnosis not present

## 2020-05-09 DIAGNOSIS — M5386 Other specified dorsopathies, lumbar region: Secondary | ICD-10-CM | POA: Diagnosis not present

## 2020-05-09 DIAGNOSIS — Z01818 Encounter for other preprocedural examination: Secondary | ICD-10-CM | POA: Diagnosis not present

## 2020-05-09 LAB — BASIC METABOLIC PANEL
Anion gap: 6 (ref 5–15)
BUN: 12 mg/dL (ref 8–23)
CO2: 31 mmol/L (ref 22–32)
Calcium: 10.1 mg/dL (ref 8.9–10.3)
Chloride: 101 mmol/L (ref 98–111)
Creatinine, Ser: 1.19 mg/dL (ref 0.61–1.24)
GFR calc Af Amer: >60 mL/min (ref >60)
GFR calc non Af Amer: >60 mL/min (ref >60)
Glucose, Bld: 95 mg/dL (ref 70–99)
Potassium: 3.9 mmol/L (ref 3.5–5.1)
Sodium: 138 mmol/L (ref 135–145)

## 2020-05-09 LAB — CBC
HCT: 43 % (ref 39.0–52.0)
Hemoglobin: 13.6 g/dL (ref 13.0–17.0)
MCH: 28.2 pg (ref 26.0–34.0)
MCHC: 31.6 g/dL (ref 30.0–36.0)
MCV: 89.2 fL (ref 80.0–100.0)
Platelets: 206 10*3/uL (ref 150–400)
RBC: 4.82 MIL/uL (ref 4.22–5.81)
RDW: 12.2 % (ref 11.5–15.5)
WBC: 8 10*3/uL (ref 4.0–10.5)
nRBC: 0 % (ref 0.0–0.2)

## 2020-05-09 LAB — SURGICAL PCR SCREEN
MRSA, PCR: NEGATIVE
Staphylococcus aureus: NEGATIVE

## 2020-05-09 NOTE — Progress Notes (Signed)
Thomas Harding at [place], [address] [place] [address] Phone: [phone] Fax: [phone]      Your procedure is scheduled on Thursday 05/18/2020.  Report to [facility] Main Entrance "A" at 05:30 A.M., and check in at the Admitting office.  Call this number if you have problems the morning of surgery:  [phone]  Call [phone] if you have any questions prior to your surgery date Monday-Friday 8am-4pm    Remember:  Do not eat after midnight the night before your surgery  You may drink clear liquids until 04:30am the morning of your surgery.   Clear liquids allowed are: Water, Non-Citrus Juices (without pulp), Carbonated Beverages, Clear Tea, Black Coffee Only, and Gatorade   Enhanced Recovery after Surgery for Orthopedics Enhanced Recovery after Surgery is a protocol used to improve the stress on your body and your recovery after surgery.  Patient Instructions  . The night before surgery:  o No food after midnight. ONLY clear liquids after midnight  .  Marland Kitchen The day of surgery (if you do NOT have diabetes):  o Drink ONE (1) Pre-Surgery Clear Ensure by 04:30 am the morning of surgery   o This drink was given to you during your hospital  pre-op appointment visit. o Nothing else to drink after completing the  Pre-Surgery Clear Ensure.          If you have questions, please contact your surgeon's office.     Take these medicines the morning of surgery with A SIP OF WATER: Gabapentin (Neurontin)  As of today, STOP taking any Aspirin (unless otherwise instructed by your surgeon) Aleve, Naproxen, Ibuprofen, Motrin, Advil, Goody's, BC's, all herbal medications, fish oil, and all vitamins.                      Do not wear jewelry.            Do not wear lotions, powders, colognes, or deodorant.            Do not shave 48 hours prior to surgery.  Men may shave face and neck.            Do not bring  valuables to the hospital.            [facility] is not responsible for any belongings or valuables.  Do NOT Smoke (Tobacco/Vaping) or drink Alcohol 24 hours prior to your procedure  If you use a CPAP at night, you may bring all equipment for your overnight stay.   Contacts, glasses, dentures or bridgework may not be worn into surgery.      For patients admitted to the hospital, discharge time will be determined by your treatment team.   Patients discharged the day of surgery will not be allowed to drive home, and someone needs to stay with them for 24 hours.    Special instructions:   [facility]- Preparing For Surgery  Before surgery, you can play an important role. Because skin is not sterile, your skin needs to be as free of germs as possible. You can reduce the number of germs on your skin by washing with CHG (chlorahexidine gluconate) Soap before surgery.  CHG is an antiseptic cleaner which kills germs and bonds with the skin to continue killing germs even after washing.    Oral Hygiene is also important to reduce your risk of infection.  Remember - BRUSH YOUR TEETH  THE MORNING OF SURGERY WITH YOUR REGULAR TOOTHPASTE  Please do not use if you have an allergy to CHG or antibacterial soaps. If your skin becomes reddened/irritated stop using the CHG.  Do not shave (including legs and underarms) for at least 48 hours prior to first CHG shower. It is OK to shave your face.  Please follow these instructions carefully.   1. Shower the NIGHT BEFORE SURGERY and the MORNING OF SURGERY with CHG Soap.   2. If you chose to wash your hair, wash your hair first as usual with your normal shampoo.  3. After you shampoo, rinse your hair and body thoroughly to remove the shampoo.  4. Use CHG as you would any other liquid soap. You can apply CHG directly to the skin and wash gently with a scrungie or a clean washcloth.   5. Apply the CHG Soap to your body ONLY FROM THE NECK DOWN.  Do not use on  open wounds or open sores. Avoid contact with your eyes, ears, mouth and genitals (private parts). Wash Face and genitals (private parts)  with your normal soap.   6. Wash thoroughly, paying special attention to the area where your surgery will be performed.  7. Thoroughly rinse your body with warm water from the neck down.  8. DO NOT shower/wash with your normal soap after using and rinsing off the CHG Soap.  9. Pat yourself dry with a CLEAN TOWEL.  10. Wear CLEAN PAJAMAS to bed the night before surgery  11. Place CLEAN SHEETS on your bed the night of your first shower and DO NOT SLEEP WITH PETS.   Day of Surgery: Shower with CHG soap as directed Wear Clean/Comfortable clothing the morning of surgery Do not apply any deodorants/lotions.   Remember to brush your teeth WITH YOUR REGULAR TOOTHPASTE.   Please read over the following fact sheets that you were given.

## 2020-05-09 NOTE — Progress Notes (Signed)
PCP - Dr. Merri Ray Cardiologist - Dr. Einar Gip  PPM/ICD - n/a Device Orders -  Rep Notified -   Chest x-ray - n/a EKG - n/a Stress Test - 08/2016 ECHO - 08/2016 Cardiac Cath -   Sleep Study - n/a CPAP -   Fasting Blood Sugar - n/a Checks Blood Sugar _____ times a day  Blood Thinner Instructions: n/a Aspirin Instructions: n/a  ERAS Protcol - clears until 0430 PRE-SURGERY Ensure or G2- Ensure provided to patient  COVID TEST- at new location 05/15/2020   Anesthesia review: yes, cardiac testing in 2017 for family history  Patient denies shortness of breath, fever, cough and chest pain at PAT appointment   All instructions explained to the patient, with a verbal understanding of the material. Patient agrees to go over the instructions while at home for a better understanding. Patient also instructed to self quarantine after being tested for COVID-19. The opportunity to ask questions was provided.

## 2020-05-10 NOTE — Progress Notes (Signed)
Anesthesia Chart Review:  Case: [id number] Date/Time: 05/18/20 0715   Procedure: Microlumbar decompression L5-S1 left (Left ) - 2 hrs   Anesthesia type: General   Pre-op diagnosis: Stenosis L5-S1   Location: [facility] OR ROOM 69 / [place] [facility]   Surgeons: Susa Day, MD      DISCUSSION: Patient is a 64 year old male scheduled for the above procedure.  History includes never smoker, HLD, rotator cuff repair.  BMI is consistent with mild obesity.  He had unremarkable stress and echo in 2017. He denied SOB, chest pain, cough, fever at PAT RN interview.  Appears he received 2nd Moderna COVID-19 vaccine on 01/08/20 ([place]). Presurgical COVID-19 test is scheduled for 05/15/20. Anesthesia team to evaluate on the day of surgery.   VS: BP (!) 132/58   Pulse 87   Temp 36.8 C (Oral)   Resp 20   Ht _0  (1.803 m)   Wt (!) 102.2 kg   SpO2 99%   BMI 31.43 kg/m   PROVIDERS: [name], MD is listed as PCP: However last visit seen is from 2019. Adrian Prows, MD is cardiologist. He was seen in 2017-2018, last visit 10/28/16 with PRN follow-up. He had unremarkable stress and echo for dyspnea evaluation, which resolved.    LABS: Labs reviewed: Acceptable for surgery. (all labs ordered are listed, but only abnormal results are displayed)  Labs Reviewed  SURGICAL PCR SCREEN  BASIC METABOLIC PANEL  CBC     IMAGES: Xray L-spine 05/09/20: FINDINGS: There are five non-rib bearing lumbar-type vertebral bodies with riblets at T12. No spondylolisthesis. Mild levocurvature of the lumbar spine. There is no evidence for acute fracture or subluxation. Mild-to-moderate multilevel intervertebral disc space height loss, most pronounced at L3-4. Visualized abdomen is unremarkable. IMPRESSION: Mild-to-moderate multilevel spondylosis.   EKG: Last EKG noted 08/05/16: NSR   CV: Echo 09/12/16 [facility], scanned under Media & Results Review tabs): Conclusion: 1. Left ventricle  cavity is normal in size. Normal global wall motion. Normal diastolic filling pattern, normal LAP. Calculated EF 62%. 2. Trace mitral regurgitation. 3. Mild tricuspid regurgitation. 4. Study is within normal limits.   ETT 09/02/16 [facility], scanned under Media & Results Review tabs): Impression:  Resting EKG demonstrates NSR. The patient exercised according to Bruce Protocol. Total time recorded 8:01 min achieving max HR rate of 174 which was 108% of THR for age and 10.16 METS of work. Normal resting BP. Hypertensive BP response. Peak BP 200/78 mmHg. There was no ST-T changes of ischemia with exercise stress test. There were no significant arrhythmias. Stress terminated due to THR (>85% MPHR/MPHR met).   Past Medical History:  Diagnosis Date  . Hyperlipidemia     Past Surgical History:  Procedure Laterality Date  . ROTATOR CUFF REPAIR Right   . thumb surgery Left   . TONSILLECTOMY    . VASECTOMY      MEDICATIONS: . ALPHA LIPOIC ACID PO  . atorvastatin (LIPITOR) 10 MG tablet  . gabapentin (NEURONTIN) 300 MG capsule  . Ibuprofen-Acetaminophen (ADVIL DUAL ACTION) 125-250 MG TABS  . omeprazole (PRILOSEC OTC) 20 MG tablet   No current facility-administered medications for this encounter.    Myra Gianotti, PA-C Surgical Short Stay/Anesthesiology [facility] Phone [phone] [facility] Phone [phone] 05/10/2020 5:27 PM

## 2020-05-10 NOTE — Anesthesia Preprocedure Evaluation (Addendum)
Anesthesia Evaluation  Patient identified by MRN, date of birth, ID band Patient awake    Reviewed: Allergy & Precautions, NPO status , Patient's Chart, lab work & pertinent test results  Airway Mallampati: II  TM Distance: >3 FB Neck ROM: Full    Dental  (+) Dental Advisory Given, Teeth Intact   Pulmonary shortness of breath,    Pulmonary exam normal breath sounds clear to auscultation       Cardiovascular negative cardio ROS Normal cardiovascular exam Rhythm:Regular Rate:Normal     Neuro/Psych negative neurological ROS     GI/Hepatic negative GI ROS, Neg liver ROS,   Endo/Other  negative endocrine ROS  Renal/GU negative Renal ROS     Musculoskeletal negative musculoskeletal ROS (+)   Abdominal (+) + obese,   Peds  Hematology negative hematology ROS (+)   Anesthesia Other Findings   Reproductive/Obstetrics                            Anesthesia Physical Anesthesia Plan  ASA: III  Anesthesia Plan: General   Post-op Pain Management:    Induction: Intravenous  PONV Risk Score and Plan: 2 and Ondansetron, Dexamethasone and Treatment may vary due to age or medical condition  Airway Management Planned: Oral ETT  Additional Equipment: None  Intra-op Plan:   Post-operative Plan: Extubation in OR  Informed Consent: I have reviewed the patients History and Physical, chart, labs and discussed the procedure including the risks, benefits and alternatives for the proposed anesthesia with the patient or authorized representative who has indicated his/her understanding and acceptance.     Dental advisory given  Plan Discussed with: CRNA  Anesthesia Plan Comments: (PAT note written 05/10/2020 by Myra Gianotti, PA-C.  Pt with frequent PVCs, but states he is asymptomatic. EKG otherwise essentially normal. Discussed with Dr. Tonita Cong and the patient that cardiac workup would likely be low  yield. Decision made to proceed.)      Anesthesia Quick Evaluation

## 2020-05-15 ENCOUNTER — Other Ambulatory Visit (HOSPITAL_COMMUNITY): Payer: BLUE CROSS/BLUE SHIELD

## 2020-05-16 ENCOUNTER — Other Ambulatory Visit (HOSPITAL_COMMUNITY)
Admission: RE | Admit: 2020-05-16 | Discharge: 2020-05-16 | Disposition: A | Discharge status: Home / Self Care | Payer: BLUE CROSS/BLUE SHIELD | Source: Ambulatory Visit | Attending: Specialist | Admitting: Specialist

## 2020-05-16 DIAGNOSIS — Z01812 Encounter for preprocedural laboratory examination: Secondary | ICD-10-CM | POA: Insufficient documentation

## 2020-05-16 DIAGNOSIS — Z20822 Contact with and (suspected) exposure to covid-19: Secondary | ICD-10-CM | POA: Insufficient documentation

## 2020-05-16 LAB — SARS CORONAVIRUS 2 (TAT 6-24 HRS): SARS Coronavirus 2: NEGATIVE

## 2020-05-17 MED ORDER — VANCOMYCIN HCL 1500 MG/300ML IV SOLN
1500.0000 mg | INTRAVENOUS | Status: AC
  Administered 2020-05-18: 1500 mg via INTRAVENOUS
  Filled 2020-05-17: qty 300

## 2020-05-18 ENCOUNTER — Ambulatory Visit (HOSPITAL_COMMUNITY)
Admission: RE | Admit: 2020-05-18 | Discharge: 2020-05-19 | Disposition: A | Discharge status: Home / Self Care | Payer: BLUE CROSS/BLUE SHIELD | Attending: Specialist | Admitting: Specialist

## 2020-05-18 ENCOUNTER — Other Ambulatory Visit: Payer: Self-pay

## 2020-05-18 ENCOUNTER — Ambulatory Visit (HOSPITAL_COMMUNITY): Payer: BLUE CROSS/BLUE SHIELD

## 2020-05-18 ENCOUNTER — Ambulatory Visit (HOSPITAL_COMMUNITY): Payer: BLUE CROSS/BLUE SHIELD | Admitting: Anesthesiology

## 2020-05-18 ENCOUNTER — Encounter (HOSPITAL_COMMUNITY): Payer: Self-pay | Admitting: Specialist

## 2020-05-18 ENCOUNTER — Encounter (HOSPITAL_COMMUNITY)
Admission: RE | Disposition: A | Discharge status: Home / Self Care | Payer: Self-pay | Source: Home / Self Care | Attending: Specialist

## 2020-05-18 ENCOUNTER — Ambulatory Visit (HOSPITAL_COMMUNITY): Payer: BLUE CROSS/BLUE SHIELD | Admitting: Vascular Surgery

## 2020-05-18 DIAGNOSIS — M5417 Radiculopathy, lumbosacral region: Secondary | ICD-10-CM | POA: Insufficient documentation

## 2020-05-18 DIAGNOSIS — Z880 Allergy status to penicillin: Secondary | ICD-10-CM | POA: Insufficient documentation

## 2020-05-18 DIAGNOSIS — Z6831 Body mass index (BMI) 31.0-31.9, adult: Secondary | ICD-10-CM | POA: Diagnosis not present

## 2020-05-18 DIAGNOSIS — M4807 Spinal stenosis, lumbosacral region: Secondary | ICD-10-CM | POA: Insufficient documentation

## 2020-05-18 DIAGNOSIS — E785 Hyperlipidemia, unspecified: Secondary | ICD-10-CM | POA: Insufficient documentation

## 2020-05-18 DIAGNOSIS — R0602 Shortness of breath: Secondary | ICD-10-CM | POA: Insufficient documentation

## 2020-05-18 DIAGNOSIS — Z91030 Bee allergy status: Secondary | ICD-10-CM | POA: Insufficient documentation

## 2020-05-18 DIAGNOSIS — Z79899 Other long term (current) drug therapy: Secondary | ICD-10-CM | POA: Insufficient documentation

## 2020-05-18 DIAGNOSIS — M48061 Spinal stenosis, lumbar region without neurogenic claudication: Secondary | ICD-10-CM | POA: Diagnosis present

## 2020-05-18 DIAGNOSIS — Z791 Long term (current) use of non-steroidal anti-inflammatories (NSAID): Secondary | ICD-10-CM | POA: Diagnosis not present

## 2020-05-18 DIAGNOSIS — E669 Obesity, unspecified: Secondary | ICD-10-CM | POA: Insufficient documentation

## 2020-05-18 DIAGNOSIS — Z419 Encounter for procedure for purposes other than remedying health state, unspecified: Secondary | ICD-10-CM

## 2020-05-18 DIAGNOSIS — Z981 Arthrodesis status: Secondary | ICD-10-CM | POA: Diagnosis not present

## 2020-05-18 HISTORY — PX: LUMBAR LAMINECTOMY/DECOMPRESSION MICRODISCECTOMY: SHX5026

## 2020-05-18 SURGERY — LUMBAR LAMINECTOMY/DECOMPRESSION MICRODISCECTOMY 1 LEVEL
Anesthesia: General | Laterality: Left

## 2020-05-18 MED ORDER — PHENYLEPHRINE 40 MCG/ML (10ML) SYRINGE FOR IV PUSH (FOR BLOOD PRESSURE SUPPORT)
PREFILLED_SYRINGE | INTRAVENOUS | Status: DC | PRN
  Administered 2020-05-18 (×3): 80 ug via INTRAVENOUS

## 2020-05-18 MED ORDER — PROPOFOL 10 MG/ML IV BOLUS
INTRAVENOUS | Status: DC | PRN
  Administered 2020-05-18: 150 mg via INTRAVENOUS

## 2020-05-18 MED ORDER — METHOCARBAMOL 1000 MG/10ML IJ SOLN
500.0000 mg | Freq: Four times a day (QID) | INTRAVENOUS | Status: DC | PRN
  Filled 2020-05-18: qty 5

## 2020-05-18 MED ORDER — MEPERIDINE HCL 25 MG/ML IJ SOLN: 6.2500 mg | INTRAMUSCULAR | Status: DC | PRN

## 2020-05-18 MED ORDER — ONDANSETRON HCL 4 MG PO TABS: 4.0000 mg | ORAL_TABLET | Freq: Four times a day (QID) | ORAL | Status: DC | PRN

## 2020-05-18 MED ORDER — DOCUSATE SODIUM 100 MG PO CAPS
100.0000 mg | ORAL_CAPSULE | Freq: Two times a day (BID) | ORAL | Status: DC
  Administered 2020-05-18 – 2020-05-19 (×3): 100 mg via ORAL
  Filled 2020-05-18 (×3): qty 1

## 2020-05-18 MED ORDER — PHENYLEPHRINE HCL-NACL 10-0.9 MG/250ML-% IV SOLN
INTRAVENOUS | Status: DC | PRN
  Administered 2020-05-18: 25 ug/min via INTRAVENOUS

## 2020-05-18 MED ORDER — MIDAZOLAM HCL 2 MG/2ML IJ SOLN
INTRAMUSCULAR | Status: AC
  Filled 2020-05-18: qty 2

## 2020-05-18 MED ORDER — OXYCODONE HCL 5 MG PO TABS: 5.0000 mg | ORAL_TABLET | ORAL | Status: DC | PRN

## 2020-05-18 MED ORDER — ACETAMINOPHEN 650 MG RE SUPP: 650.0000 mg | RECTAL | Status: DC | PRN

## 2020-05-18 MED ORDER — HYDROMORPHONE HCL 1 MG/ML IJ SOLN
INTRAMUSCULAR | Status: AC
  Filled 2020-05-18: qty 1

## 2020-05-18 MED ORDER — FENTANYL CITRATE (PF) 250 MCG/5ML IJ SOLN
INTRAMUSCULAR | Status: DC | PRN
  Administered 2020-05-18 (×5): 50 ug via INTRAVENOUS

## 2020-05-18 MED ORDER — LIDOCAINE 2% (20 MG/ML) 5 ML SYRINGE
INTRAMUSCULAR | Status: AC
  Filled 2020-05-18: qty 5

## 2020-05-18 MED ORDER — ALUM & MAG HYDROXIDE-SIMETH 200-200-20 MG/5ML PO SUSP: 30.0000 mL | Freq: Four times a day (QID) | ORAL | Status: DC | PRN

## 2020-05-18 MED ORDER — GABAPENTIN 300 MG PO CAPS
300.0000 mg | ORAL_CAPSULE | Freq: Four times a day (QID) | ORAL | Status: DC
  Administered 2020-05-18 – 2020-05-19 (×3): 300 mg via ORAL
  Filled 2020-05-18 (×3): qty 1

## 2020-05-18 MED ORDER — EPHEDRINE SULFATE-NACL 50-0.9 MG/10ML-% IV SOSY
PREFILLED_SYRINGE | INTRAVENOUS | Status: DC | PRN
  Administered 2020-05-18: 10 mg via INTRAVENOUS

## 2020-05-18 MED ORDER — POLYETHYLENE GLYCOL 3350 17 G PO PACK: 17.0000 g | PACK | Freq: Every day | ORAL | 0 refills | Status: DC

## 2020-05-18 MED ORDER — SODIUM CHLORIDE 0.9 % IV SOLN
INTRAVENOUS | Status: DC | PRN
  Administered 2020-05-18: 500 mL

## 2020-05-18 MED ORDER — SUGAMMADEX SODIUM 200 MG/2ML IV SOLN
INTRAVENOUS | Status: DC | PRN
  Administered 2020-05-18: 200 mg via INTRAVENOUS

## 2020-05-18 MED ORDER — CHLORHEXIDINE GLUCONATE 0.12 % MT SOLN
OROMUCOSAL | Status: AC
  Administered 2020-05-18: 15 mL
  Filled 2020-05-18: qty 15

## 2020-05-18 MED ORDER — ONDANSETRON HCL 4 MG/2ML IJ SOLN: 4.0000 mg | Freq: Four times a day (QID) | INTRAMUSCULAR | Status: DC | PRN

## 2020-05-18 MED ORDER — HYDROMORPHONE HCL 1 MG/ML IJ SOLN: 0.5000 mg | INTRAMUSCULAR | Status: DC | PRN

## 2020-05-18 MED ORDER — PROPOFOL 10 MG/ML IV BOLUS
INTRAVENOUS | Status: AC
  Filled 2020-05-18: qty 40

## 2020-05-18 MED ORDER — VANCOMYCIN HCL 1500 MG/300ML IV SOLN
1500.0000 mg | Freq: Once | INTRAVENOUS | Status: AC
  Administered 2020-05-18: 1500 mg via INTRAVENOUS
  Filled 2020-05-18: qty 300

## 2020-05-18 MED ORDER — EPHEDRINE 5 MG/ML INJ
INTRAVENOUS | Status: AC
  Filled 2020-05-18: qty 10

## 2020-05-18 MED ORDER — PHENYLEPHRINE 40 MCG/ML (10ML) SYRINGE FOR IV PUSH (FOR BLOOD PRESSURE SUPPORT)
PREFILLED_SYRINGE | INTRAVENOUS | Status: AC
  Filled 2020-05-18: qty 10

## 2020-05-18 MED ORDER — ACETAMINOPHEN 10 MG/ML IV SOLN
1000.0000 mg | INTRAVENOUS | Status: DC
  Filled 2020-05-18: qty 100

## 2020-05-18 MED ORDER — ACETAMINOPHEN 10 MG/ML IV SOLN
INTRAVENOUS | Status: DC | PRN
  Administered 2020-05-18: 1000 mg via INTRAVENOUS

## 2020-05-18 MED ORDER — MAGNESIUM CITRATE PO SOLN: 1.0000 | Freq: Once | ORAL | Status: DC | PRN

## 2020-05-18 MED ORDER — MENTHOL 3 MG MT LOZG
1.0000 | LOZENGE | OROMUCOSAL | Status: DC | PRN
  Filled 2020-05-18: qty 9

## 2020-05-18 MED ORDER — MIDAZOLAM HCL 2 MG/2ML IJ SOLN
INTRAMUSCULAR | Status: DC | PRN
  Administered 2020-05-18: 2 mg via INTRAVENOUS

## 2020-05-18 MED ORDER — 0.9 % SODIUM CHLORIDE (POUR BTL) OPTIME
TOPICAL | Status: DC | PRN
  Administered 2020-05-18: 1000 mL

## 2020-05-18 MED ORDER — ROCURONIUM BROMIDE 10 MG/ML (PF) SYRINGE
PREFILLED_SYRINGE | INTRAVENOUS | Status: DC | PRN
  Administered 2020-05-18: 50 mg via INTRAVENOUS

## 2020-05-18 MED ORDER — POLYETHYLENE GLYCOL 3350 17 G PO PACK: 17.0000 g | PACK | Freq: Every day | ORAL | Status: DC | PRN

## 2020-05-18 MED ORDER — FENTANYL CITRATE (PF) 250 MCG/5ML IJ SOLN
INTRAMUSCULAR | Status: AC
  Filled 2020-05-18: qty 5

## 2020-05-18 MED ORDER — DOCUSATE SODIUM 100 MG PO CAPS
100.0000 mg | ORAL_CAPSULE | Freq: Two times a day (BID) | ORAL | 2 refills | Status: DC
Start: 2020-05-18 — End: 2020-05-26

## 2020-05-18 MED ORDER — THROMBIN 20000 UNITS EX SOLR
CUTANEOUS | Status: DC | PRN
  Administered 2020-05-18: 20 mL via TOPICAL

## 2020-05-18 MED ORDER — BUPIVACAINE-EPINEPHRINE 0.5% -1:200000 IJ SOLN
INTRAMUSCULAR | Status: AC
  Filled 2020-05-18: qty 1

## 2020-05-18 MED ORDER — HYDROMORPHONE HCL 1 MG/ML IJ SOLN
0.2500 mg | INTRAMUSCULAR | Status: DC | PRN
  Administered 2020-05-18: 0.25 mg via INTRAVENOUS

## 2020-05-18 MED ORDER — PHENOL 1.4 % MT LIQD: 1.0000 | OROMUCOSAL | Status: DC | PRN

## 2020-05-18 MED ORDER — THROMBIN 20000 UNITS EX SOLR
CUTANEOUS | Status: AC
  Filled 2020-05-18: qty 20000

## 2020-05-18 MED ORDER — OXYCODONE HCL 5 MG PO TABS: 5.0000 mg | ORAL_TABLET | ORAL | 0 refills | Status: DC | PRN

## 2020-05-18 MED ORDER — DEXAMETHASONE SODIUM PHOSPHATE 10 MG/ML IJ SOLN
INTRAMUSCULAR | Status: DC | PRN
  Administered 2020-05-18: 10 mg via INTRAVENOUS

## 2020-05-18 MED ORDER — LACTATED RINGERS IV SOLN: INTRAVENOUS | Status: DC

## 2020-05-18 MED ORDER — METHOCARBAMOL 500 MG PO TABS
500.0000 mg | ORAL_TABLET | Freq: Four times a day (QID) | ORAL | Status: DC | PRN
  Filled 2020-05-18: qty 1

## 2020-05-18 MED ORDER — BISACODYL 5 MG PO TBEC: 5.0000 mg | DELAYED_RELEASE_TABLET | Freq: Every day | ORAL | Status: DC | PRN

## 2020-05-18 MED ORDER — PROMETHAZINE HCL 25 MG/ML IJ SOLN: 6.2500 mg | INTRAMUSCULAR | Status: DC | PRN

## 2020-05-18 MED ORDER — KCL IN DEXTROSE-NACL 20-5-0.45 MEQ/L-%-% IV SOLN: INTRAVENOUS | Status: DC

## 2020-05-18 MED ORDER — LIDOCAINE 2% (20 MG/ML) 5 ML SYRINGE
INTRAMUSCULAR | Status: DC | PRN
  Administered 2020-05-18: 100 mg via INTRAVENOUS

## 2020-05-18 MED ORDER — OMEPRAZOLE MAGNESIUM 20 MG PO TBEC: 20.0000 mg | DELAYED_RELEASE_TABLET | Freq: Every day | ORAL | Status: DC

## 2020-05-18 MED ORDER — OXYCODONE HCL 5 MG PO TABS
10.0000 mg | ORAL_TABLET | ORAL | Status: DC | PRN
  Administered 2020-05-18: 10 mg via ORAL
  Filled 2020-05-18 (×2): qty 2

## 2020-05-18 MED ORDER — BUPIVACAINE-EPINEPHRINE 0.5% -1:200000 IJ SOLN
INTRAMUSCULAR | Status: DC | PRN
  Administered 2020-05-18: 7 mL

## 2020-05-18 MED ORDER — ROCURONIUM BROMIDE 10 MG/ML (PF) SYRINGE
PREFILLED_SYRINGE | INTRAVENOUS | Status: AC
  Filled 2020-05-18: qty 10

## 2020-05-18 MED ORDER — ACETAMINOPHEN 325 MG PO TABS
650.0000 mg | ORAL_TABLET | ORAL | Status: DC | PRN
  Administered 2020-05-18 – 2020-05-19 (×2): 650 mg via ORAL
  Filled 2020-05-18 (×2): qty 2

## 2020-05-18 MED ORDER — ONDANSETRON HCL 4 MG/2ML IJ SOLN
INTRAMUSCULAR | Status: DC | PRN
  Administered 2020-05-18: 4 mg via INTRAVENOUS

## 2020-05-18 SURGICAL SUPPLY — 53 items
BAG DECANTER FOR FLEXI CONT (MISCELLANEOUS) ×2 IMPLANT
BAND INSRT 18 STRL LF DISP RB (MISCELLANEOUS) ×2
BAND RUBBER #18 3X1/16 STRL (MISCELLANEOUS) ×4 IMPLANT
CLEANER TIP ELECTROSURG 2X2 (MISCELLANEOUS) ×2 IMPLANT
CNTNR URN SCR LID CUP LEK RST (MISCELLANEOUS) ×1 IMPLANT
CONT SPEC 4OZ STRL OR WHT (MISCELLANEOUS) ×2
COVER WAND RF STERILE (DRAPES) IMPLANT
DRAPE LAPAROTOMY 100X72X124 (DRAPES) ×2 IMPLANT
DRAPE MICROSCOPE LEICA (MISCELLANEOUS) ×2 IMPLANT
DRAPE SHEET LG 3/4 BI-LAMINATE (DRAPES) ×2 IMPLANT
DRAPE SURG 17X11 SM STRL (DRAPES) ×2 IMPLANT
DRAPE UTILITY XL STRL (DRAPES) ×2 IMPLANT
DRSG AQUACEL AG ADV 3.5X 4 (GAUZE/BANDAGES/DRESSINGS) IMPLANT
DRSG AQUACEL AG ADV 3.5X 6 (GAUZE/BANDAGES/DRESSINGS) ×2 IMPLANT
DRSG TELFA 3X8 NADH (GAUZE/BANDAGES/DRESSINGS) IMPLANT
DURAPREP 26ML APPLICATOR (WOUND CARE) ×2 IMPLANT
DURASEAL SPINE SEALANT 3ML (MISCELLANEOUS) IMPLANT
ELECT BLADE 4.0 EZ CLEAN MEGAD (MISCELLANEOUS)
ELECT REM PT RETURN 9FT ADLT (ELECTROSURGICAL) ×2
ELECTRODE BLDE 4.0 EZ CLN MEGD (MISCELLANEOUS) IMPLANT
ELECTRODE REM PT RTRN 9FT ADLT (ELECTROSURGICAL) ×1 IMPLANT
GLOVE BIOGEL PI IND STRL 7.0 (GLOVE) ×1 IMPLANT
GLOVE BIOGEL PI INDICATOR 7.0 (GLOVE) ×1
GLOVE SURG SS PI 7.5 STRL IVOR (GLOVE) ×2 IMPLANT
GLOVE SURG SS PI 8.0 STRL IVOR (GLOVE) ×4 IMPLANT
GOWN STRL REUS W/ TWL LRG LVL3 (GOWN DISPOSABLE) ×1 IMPLANT
GOWN STRL REUS W/ TWL XL LVL3 (GOWN DISPOSABLE) ×1 IMPLANT
GOWN STRL REUS W/TWL LRG LVL3 (GOWN DISPOSABLE) ×2
GOWN STRL REUS W/TWL XL LVL3 (GOWN DISPOSABLE) ×2
IV CATH 14GX2 1/4 (CATHETERS) ×2 IMPLANT
KIT BASIN OR (CUSTOM PROCEDURE TRAY) ×2 IMPLANT
KIT POSITION SURG JACKSON T1 (MISCELLANEOUS) ×2 IMPLANT
NEEDLE 22X1 1/2 (OR ONLY) (NEEDLE) ×2 IMPLANT
NEEDLE SPNL 18GX3.5 QUINCKE PK (NEEDLE) ×4 IMPLANT
PACK LAMINECTOMY NEURO (CUSTOM PROCEDURE TRAY) ×2 IMPLANT
PATTIES SURGICAL .75X.75 (GAUZE/BANDAGES/DRESSINGS) ×2 IMPLANT
SPONGE LAP 4X18 RFD (DISPOSABLE) IMPLANT
SPONGE SURGIFOAM ABS GEL 100 (HEMOSTASIS) ×2 IMPLANT
STAPLER VISISTAT (STAPLE) ×2 IMPLANT
STRIP CLOSURE SKIN 1/2X4 (GAUZE/BANDAGES/DRESSINGS) ×2 IMPLANT
SUT NURALON 4 0 TR CR/8 (SUTURE) IMPLANT
SUT PROLENE 3 0 PS 2 (SUTURE) IMPLANT
SUT VIC AB 1 CT1 27 (SUTURE)
SUT VIC AB 1 CT1 27XBRD ANTBC (SUTURE) IMPLANT
SUT VIC AB 1-0 CT2 27 (SUTURE) ×4 IMPLANT
SUT VIC AB 2-0 CT1 27 (SUTURE)
SUT VIC AB 2-0 CT1 TAPERPNT 27 (SUTURE) IMPLANT
SUT VIC AB 2-0 CT2 27 (SUTURE) ×2 IMPLANT
SYR 3ML LL SCALE MARK (SYRINGE) ×2 IMPLANT
TOWEL GREEN STERILE (TOWEL DISPOSABLE) ×2 IMPLANT
TOWEL GREEN STERILE FF (TOWEL DISPOSABLE) ×2 IMPLANT
TRAY FOLEY MTR SLVR 16FR STAT (SET/KITS/TRAYS/PACK) ×2 IMPLANT
YANKAUER SUCT BULB TIP NO VENT (SUCTIONS) ×2 IMPLANT

## 2020-05-18 NOTE — Op Note (Signed)
NAME: Thomas Harding, Thomas Harding MEDICAL RECORD [id number] ACCOUNT [id number] DATE OF BIRTH:09/10/1956 FACILITY: [facility] LOCATION: [facility] PHYSICIAN:Keeyon Privitera Windy Kalata, MD  OPERATIVE REPORT  DATE OF PROCEDURE:  05/18/2020  PREOPERATIVE DIAGNOSIS:  Spinal stenosis at L5-S1 with foraminal stenosis of L5.  POSTOPERATIVE DIAGNOSES:   1.  Spinal stenosis at L5-S1 with foraminal stenosis of L5.   2.  Stenosis at L4-5.  PROCEDURE PERFORMED: 1.  Microlumbar decompression, L5-S1 and L4-L5.   2.  Hemilaminectomy at L5 left.   3.  Foraminotomies of L5 and S1 left. 4.  Partial facetectomy of L5-S1 on the left.  ANESTHESIA:  General.  SURGEON:  Susa Day, MD  ASSISTANT:  Lacie Draft, PA  HISTORY:  A 64 year old with L5 radiculopathy, EHL weakness neuro tension signs, MRI indicating foraminal stenosis at L5 due to facet hypertrophy.  Lateral recess stenosis at L4-L5.  He had conservative treatment that had failed, had temporary relief  from an L5 selective nerve root block.  He was indicated for decompression of the L5 nerve root by foraminotomy, L5-S1 left, with possible hemilaminectomy and decompression at L4-L5.  Risks and benefits discussed including bleeding, infection, damage to  neurovascular structures, no change in symptoms, worsening symptoms, DVT, PE, anesthetic complications, etc.  DESCRIPTION OF PROCEDURE:  With  the patient in supine position, after induction of adequate general anesthesia, 1.5 g of vancomycin for prophylaxis,  he was placed prone on the Wilson frame, all bony prominences well padded.  The lumbar region was then  prepped and draped in the usual sterile fashion.  Two 18-gauge spinal needles were utilized to localize the L5-S1 interspace and the L5 spinous process.  An incision was made from above the spinous process of L5 to below S1.  Subcutaneous tissue was  dissected with Cardiolite to achieve hemostasis.  Dorsal lumbar fascia divided in line with skin incision.   Paraspinous muscle elevated from lamina of L5 and L5-S1.  McCullough retractor was placed.  Operating microscope was draped and brought in the  surgical field and  confirmatory radiograph obtained.  His interlaminar window at L5-S1 was somewhat cephalad.  He had a hypertrophic L5-S1 facet.  I skeletonized the interlaminar window of L5-S1 and the L5 lamina.  Following that, I used a micro  osteotome to perform partial medial hemifacetectomy of the inferior process of L5.  I preserved the pars.  This exposed the superior articulating process of S1, which was the pathology noted on the MRI.  After skeletonizing the superior articulating  process, I detached ligamentum flavum from the cephalad edge of the S1.  I performed a foraminotomy of S1.  I gently mobilized the S1 nerve root medially.  I decompressed the lateral recess to the medial border of the pedicle.  With a Woodson retractor  protecting the neural elements in the L5 nerve root, I removed the cephalad portion of the superior articulating process of S1.  There was severe stenosis noted in the lateral recess and out into the foramen.  This was removed with a micro curette and a  2 mm Kerrison, taking care not to compress the L5 root.  On both sides of the operating room table, this was accomplished.  I also performed a hemilaminotomy of the caudad edge of L5, again preserving the pars there.  There was some shingling of the L5  lamina and as the [facility] could be passed freely out the foramen of S1 and L5 after the foraminotomy and the decompression, there it was slightly tight cephalad.  I then performed a hemilaminectomy of L5 to follow the nerve root cephalad.  I also removed  ligamentum flavum at interspace at L5-S1.  There was some hypertrophic ligamentum in the lateral recess there.  This was removed.  Following this, I tracked the L5 nerve root from its origin to out the foramen of L5.  Then the [place] probe passed  freely now out the foramen of  L5, where prior to the decompression, it did not.  Again, the pathology was mainly the superior articulating process of S1 and infolded ligamentum flavum hypertrophy.  There was extensive venous plexus seen after removing  the superior articulating process.  Bone wax was placed on the cancellous surfaces.  Then, I copiously irrigated the wound.  I obtained a confirmatory radiograph with a [place] at L4-L5 and a Woodson retractor into the foramen of L5.  Inspection  revealed no evidence of CSF leakage or active bleeding.  There was also some ligamentum flavum hypertrophy as well into the lateral recess at L5-S1 contributing to the pathology.  There was no disk herniation noted with inspection of the disk.  After  this, we inspected, no evidence of active bleeding or CSF leakage.  I copiously irrigated the wound with antibiotic irrigation.  I then removed the McCulloch retractor and inspected the paraspinous muscles.  No active bleeding.  They were irrigated as  well and I closed the dorsal lumbar fascia with #1 Vicryl interrupted figure-of-eight suture.  The small aperture caudad was left open for approximately 4 mm.  I then closed the subcutaneous with 2-0 and skin with staples.  The wound was dressed  sterilely.  He was placed supine on the hospital bed, extubated without difficulty and transported to the recovery room in satisfactory condition.  The patient tolerated the procedure well.  No complications.  Assistant Lacie Draft, [place].  Blood loss approximately 100 mL.  VN/NUANCE  D:05/18/2020 T:05/18/2020 JOB:[id number]

## 2020-05-18 NOTE — Progress Notes (Signed)
Pharmacy Antibiotic Note  Thomas Harding is a 64 y.o. male admitted on 05/18/2020 with back and left leg pain now s/p lumbar decompression 05/18/20. Pharmacy has been consulted for vancomycin dosing for surgical prophylaxis.  No drain in place, will dose vanc x 1 to complete surgical prophylaxis.   Plan: Vancomycin 1500mg  IV x 1 (15mg /kg) 12 hours post-op Monitor clinical picture, renal function   Height: 5\' 11"  (180.3 cm) Weight: 102.7 kg (226 lb 8 oz) IBW/kg (Calculated) : 75.3  Temp (24hrs), Avg:98.1 F (36.7 C), Min:97.8 F (36.6 C), Max:98.8 F (37.1 C)  No results for input(s): WBC, CREATININE, LATICACIDVEN, VANCOTROUGH, VANCOPEAK, VANCORANDOM, GENTTROUGH, GENTPEAK, GENTRANDOM, TOBRATROUGH, TOBRAPEAK, TOBRARND, AMIKACINPEAK, AMIKACINTROU, AMIKACIN in the last 168 hours.  Estimated Creatinine Clearance: 77.6 mL/min (by C-G formula based on SCr of 1.19 mg/dL).    Allergies  Allergen Reactions  . Bee Venom Anaphylaxis  . Penicillins Hives and Swelling    Brendolyn Patty, PharmD Clinical Pharmacist  05/18/2020   2:47 PM   Please check AMION for all [place] phone numbers After 10:00 PM, call the [facility] [phone]

## 2020-05-18 NOTE — Transfer of Care (Signed)
Immediate Anesthesia Transfer of Care Note  Patient: Thomas Harding  Procedure(s) Performed: Microlumbar decompression Lumbar Four- Lumbar Five, Lumbar Five- Sacral One left (Left )  Patient Location: PACU  Anesthesia Type:General  Level of Consciousness: drowsy and patient cooperative  Airway & Oxygen Therapy: Patient Spontanous Breathing and Patient connected to face mask oxygen  Post-op Assessment: Report given to RN and Post -op Vital signs reviewed and stable  Post vital signs: Reviewed and stable  Last Vitals:  Vitals Value Taken Time  BP 128/70 05/18/20 1016  Temp    Pulse 96 05/18/20 1017  Resp 17 05/18/20 1017  SpO2 99 % 05/18/20 1017  Vitals shown include unvalidated device data.  Last Pain:  Vitals:   05/18/20 0701  TempSrc:   PainSc: 3       Patients Stated Pain Goal: 3 (38/17/71 1657)  Complications: No complications documented.

## 2020-05-18 NOTE — Brief Op Note (Signed)
05/18/2020  9:56 AM  PATIENT:  Thomas Harding  64 y.o. male  PRE-OPERATIVE DIAGNOSIS:  Stenosis Lumbar Five-Sacral One  POST-OPERATIVE DIAGNOSIS:  Stenosis Lumbar Five-Sacral One  PROCEDURE:  Procedure(s) with comments: Microlumbar decompression Lumbar Four- Lumbar Five, Lumbar Five- Sacral One left (Left) - Microlumbar decompression Lumbar Five- Sacral One left  SURGEON:  Surgeon(s) and Role:    Susa Day, MD - Primary  PHYSICIAN ASSISTANT:   ASSISTANTS: [place]   ANESTHESIA:   general  EBL:  150 mL   BLOOD ADMINISTERED:none  DRAINS: none   LOCAL MEDICATIONS USED:  MARCAINE     SPECIMEN:  No Specimen  DISPOSITION OF SPECIMEN:  N/A  COUNTS:  YES  TOURNIQUET:  * No tourniquets in log *  DICTATION: .Other Dictation: Dictation Number [id number]  PLAN OF CARE: Admit for overnight observation  PATIENT DISPOSITION:  PACU - hemodynamically stable.   Delay start of Pharmacological VTE agent (>24hrs) due to surgical blood loss or risk of bleeding: yes

## 2020-05-18 NOTE — Evaluation (Signed)
Physical Therapy Evaluation Patient Details Name: Thomas Harding MRN: [id number] DOB: Feb 16, 1956 Today's Date: 05/18/2020   History of Present Illness  Pt is 64 yo male who is s/p L4/5 decompression surgery. Pt PMH was unremarkable except for HLD  Clinical Impression  Pt was evaluated and assessed for the above diagnosis and the impairments listed below. Pt required supervision for bed mobility.  Pt required HHA with min guard to min assist with transfers and ambulation. Pt was educated on walking program and spinal precautions. After surgeon clears from precautions, recommend outpatient PT. Pt would continue to benefit from continued skilled therapy in order to increase his independence with functional task. Will continue to follow acutely.     Follow Up Recommendations Outpatient PT (once cleared by MD)    Equipment Recommendations  Kasandra Knudsen (pt may refuse )    Recommendations for Other Services       Precautions / Restrictions Precautions Precautions: Back;Fall Precaution Booklet Issued: Yes (comment) Precaution Comments: pt educated on BLT precautions  Restrictions Weight Bearing Restrictions: No      Mobility  Bed Mobility Overal bed mobility: Needs Assistance Bed Mobility: Rolling;Sidelying to Sit;Sit to Supine Rolling: Supervision Sidelying to sit: Supervision   Sit to supine: Supervision   General bed mobility comments: pt required supervision for safety with bed mobility. Pt required verbal cuing for log roll and hand placement with sidelying to sit.   Transfers Overall transfer level: Needs assistance Equipment used: 1 person hand held assist Transfers: Sit to/from Stand Sit to Stand: Min assist         General transfer comment: Pt was noted to have increased time with power up with one failed attempt at standing. pt required min assist with HHA for lift assist with sit<>stand transfer  Ambulation/Gait Ambulation/Gait assistance: Min guard Gait Distance  (Feet): 100 Feet Assistive device: 1 person hand held assist (pt utilized wall railings) Gait Pattern/deviations: Decreased dorsiflexion - left;Decreased stride length;Decreased step length - left;Decreased step length - right;Step-through pattern;Staggering right (Simultaneous filing. User may not have seen previous data.) Gait velocity: decreased   General Gait Details: pt required HHA or wall railings with min guard assist. Pt was noted to have decreased DF strength on LLE and required verbal cuing for lifting knees up and keeping toes up with heel contact. Pt with noted waddle type gait when UE wasn't supported  Stairs            Wheelchair Mobility    Modified Rankin (Stroke Patients Only)       Balance Overall balance assessment: Needs assistance Sitting-balance support: No upper extremity supported;Feet supported Sitting balance-Leahy Scale: Fair Sitting balance - Comments: pt able to sit at EOB without UE support   Standing balance support: During functional activity;Single extremity supported Standing balance-Leahy Scale: Poor Standing balance comment: pt required min assist when coming to standing. pt required UE support when ambulating                              Pertinent Vitals/Pain Pain Assessment: 0-10 Pain Score: 3  Pain Location: back Pain Descriptors / Indicators: Aching;Constant;Operative site guarding Pain Intervention(s): Limited activity within patient's tolerance;Monitored during session;Premedicated before session    [place] expects to be discharged to:: Private residence Living Arrangements: Spouse/significant other Available Help at Discharge: Family Type of Home: House Home Access: Stairs to enter Entrance Stairs-Rails: Left Entrance Stairs-Number of Steps: 7 Home Layout: Able to live  on main level with bedroom/bathroom;Two level Home Equipment: Shower seat - built in      Prior Function Level of  Independence: Independent               Hand Dominance        Extremity/Trunk Assessment   Upper Extremity Assessment Upper Extremity Assessment: Defer to OT evaluation    Lower Extremity Assessment Lower Extremity Assessment: LLE deficits/detail;Overall WFL for tasks assessed LLE Deficits / Details: L DF strength 2+/5 LLE Sensation: decreased light touch (in toes when in supine)    Cervical / Trunk Assessment Cervical / Trunk Assessment: Other exceptions (s/p lumbar decompression)  Communication   Communication: No difficulties  Cognition Arousal/Alertness: Awake/alert Behavior During Therapy: WFL for tasks assessed/performed Overall Cognitive Status: Within Functional Limits for tasks assessed               General Comments      Exercises     Assessment/Plan    PT Assessment Patient needs continued PT services  PT Problem List Decreased strength;Decreased range of motion;Decreased activity tolerance;Decreased mobility;Decreased balance;Decreased coordination;Decreased knowledge of use of DME;Decreased knowledge of precautions;Pain       PT Treatment Interventions DME instruction;Gait training;Stair training;Functional mobility training;Therapeutic activities;Therapeutic exercise;Balance training;Neuromuscular re-education;Patient/family education;Modalities    PT Goals (Current goals can be found in the Care Plan section)  Acute Rehab PT Goals Patient Stated Goal: be pain free PT Goal Formulation: With patient Time For Goal Achievement: 06/01/20 Potential to Achieve Goals: Good    Frequency Min 5X/week   Barriers to discharge        Co-evaluation               AM-PAC PT "6 Clicks" Mobility  Outcome Measure Help needed turning from your back to your side while in a flat bed without using bedrails?: None Help needed moving from lying on your back to sitting on the side of a flat bed without using bedrails?: None Help needed moving to and  from a bed to a chair (including a wheelchair)?: A Little Help needed standing up from a chair using your arms (e.g., wheelchair or bedside chair)?: A Little Help needed to walk in hospital room?: A Little Help needed climbing 3-5 steps with a railing? : A Little 6 Click Score: 20    End of Session Equipment Utilized During Treatment: Gait belt Activity Tolerance: Patient tolerated treatment well Patient left: in bed;with call Chrissa Meetze/phone within reach Nurse Communication: Mobility status PT Visit Diagnosis: Unsteadiness on feet (R26.81);Other abnormalities of gait and mobility (R26.89);Muscle weakness (generalized) (M62.81);Pain Pain - part of body:  (back)    Time: 9211-9417 PT Time Calculation (min) (ACUTE ONLY): 31 min   Charges:   PT Evaluation $PT Eval Low Complexity: 1 Low PT Treatments $Gait Training: 8-22 mins       Gloriann Loan, SPT  Acute Rehabilitation Services  Office: [phone]  05/18/2020, 6:23 PM

## 2020-05-18 NOTE — Discharge Instructions (Signed)

## 2020-05-18 NOTE — Interval H&P Note (Signed)
History and Physical Interval Note:  05/18/2020 7:34 AM  Thomas Harding  has presented today for surgery, with the diagnosis of Stenosis L5-S1.  The various methods of treatment have been discussed with the patient and family. After consideration of risks, benefits and other options for treatment, the patient has consented to  Procedure(s) with comments: Microlumbar decompression L5-S1 left (Left) - 2 hrs as a surgical intervention.  The patient's history has been reviewed, patient examined, no change in status, stable for surgery.  I have reviewed the patient's chart and labs.  Questions were answered to the patient's satisfaction.     Johnn Hai

## 2020-05-18 NOTE — Anesthesia Procedure Notes (Signed)
Procedure Name: Intubation Date/Time: 05/18/2020 8:07 AM Performed by: Kathryne Hitch, CRNA Pre-anesthesia Checklist: Patient identified, Emergency Drugs available, Suction available and Patient being monitored Patient Re-evaluated:Patient Re-evaluated prior to induction Oxygen Delivery Method: Circle system utilized Preoxygenation: Pre-oxygenation with 100% oxygen Induction Type: IV induction Ventilation: Mask ventilation without difficulty Laryngoscope Size: Miller and 3 Grade View: Grade I Tube type: Oral Tube size: 7.5 mm Number of attempts: 1 Airway Equipment and Method: Stylet and Oral airway Placement Confirmation: ETT inserted through vocal cords under direct vision,  positive ETCO2 and breath sounds checked- equal and bilateral Secured at: 23 cm Tube secured with: Tape Dental Injury: Teeth and Oropharynx as per pre-operative assessment

## 2020-05-18 NOTE — Anesthesia Postprocedure Evaluation (Signed)
Anesthesia Post Note  Patient: Thomas Harding  Procedure(s) Performed: Microlumbar decompression Lumbar Four- Lumbar Five, Lumbar Five- Sacral One left (Left )     Patient location during evaluation: PACU Anesthesia Type: General Level of consciousness: awake and alert Pain management: pain level controlled Vital Signs Assessment: post-procedure vital signs reviewed and stable Respiratory status: spontaneous breathing Cardiovascular status: stable Anesthetic complications: no   No complications documented.  Last Vitals:  Vitals:   05/18/20 1050 05/18/20 1100  BP: 105/60 117/64  Pulse: (!) 48 89  Resp: 20 14  Temp:    SpO2: 98% 95%    Last Pain:  Vitals:   05/18/20 1050  TempSrc:   PainSc: [place]

## 2020-05-19 ENCOUNTER — Telehealth: Payer: Self-pay | Admitting: Family Medicine

## 2020-05-19 ENCOUNTER — Other Ambulatory Visit: Payer: Self-pay

## 2020-05-19 ENCOUNTER — Encounter (HOSPITAL_COMMUNITY): Payer: Self-pay | Admitting: Specialist

## 2020-05-19 DIAGNOSIS — Z791 Long term (current) use of non-steroidal anti-inflammatories (NSAID): Secondary | ICD-10-CM | POA: Diagnosis not present

## 2020-05-19 DIAGNOSIS — E785 Hyperlipidemia, unspecified: Secondary | ICD-10-CM | POA: Diagnosis not present

## 2020-05-19 DIAGNOSIS — Z880 Allergy status to penicillin: Secondary | ICD-10-CM | POA: Diagnosis not present

## 2020-05-19 DIAGNOSIS — E669 Obesity, unspecified: Secondary | ICD-10-CM | POA: Diagnosis not present

## 2020-05-19 DIAGNOSIS — Z79899 Other long term (current) drug therapy: Secondary | ICD-10-CM | POA: Diagnosis not present

## 2020-05-19 DIAGNOSIS — M4807 Spinal stenosis, lumbosacral region: Secondary | ICD-10-CM | POA: Diagnosis not present

## 2020-05-19 DIAGNOSIS — R0602 Shortness of breath: Secondary | ICD-10-CM | POA: Diagnosis not present

## 2020-05-19 DIAGNOSIS — M5417 Radiculopathy, lumbosacral region: Secondary | ICD-10-CM | POA: Diagnosis not present

## 2020-05-19 DIAGNOSIS — Z0000 Encounter for general adult medical examination without abnormal findings: Secondary | ICD-10-CM

## 2020-05-19 DIAGNOSIS — Z91030 Bee allergy status: Secondary | ICD-10-CM | POA: Diagnosis not present

## 2020-05-19 DIAGNOSIS — Z6831 Body mass index (BMI) 31.0-31.9, adult: Secondary | ICD-10-CM | POA: Diagnosis not present

## 2020-05-19 DIAGNOSIS — M48061 Spinal stenosis, lumbar region without neurogenic claudication: Secondary | ICD-10-CM | POA: Diagnosis not present

## 2020-05-19 LAB — COMPREHENSIVE METABOLIC PANEL
ALT: 18 U/L (ref 0–44)
AST: 20 U/L (ref 15–41)
Albumin: 3.3 g/dL — ABNORMAL LOW (ref 3.5–5.0)
Alkaline Phosphatase: 61 U/L (ref 38–126)
Anion gap: 11 (ref 5–15)
BUN: 20 mg/dL (ref 8–23)
CO2: 24 mmol/L (ref 22–32)
Calcium: 8.3 mg/dL — ABNORMAL LOW (ref 8.9–10.3)
Chloride: 102 mmol/L (ref 98–111)
Creatinine, Ser: 1.34 mg/dL — ABNORMAL HIGH (ref 0.61–1.24)
GFR calc Af Amer: >60 mL/min (ref >60)
GFR calc non Af Amer: 56 mL/min — ABNORMAL LOW (ref >60)
Glucose, Bld: 187 mg/dL — ABNORMAL HIGH (ref 70–99)
Potassium: 4.6 mmol/L (ref 3.5–5.1)
Sodium: 137 mmol/L (ref 135–145)
Total Bilirubin: 1.1 mg/dL (ref 0.3–1.2)
Total Protein: 6 g/dL — ABNORMAL LOW (ref 6.5–8.1)

## 2020-05-19 MED ORDER — LIVING WELL WITH DIABETES BOOK
Freq: Once | Status: DC
  Filled 2020-05-19: qty 1

## 2020-05-19 NOTE — Evaluation (Signed)
Occupational Therapy Evaluation Patient Details Name: Thomas Harding MRN: [id number] DOB: 03-28-1956 Today's Date: 05/19/2020    History of Present Illness Pt is 64 yo male who is s/p L4/5 decompression surgery. Pt PMH was unremarkable except for HLD   Clinical Impression   Thomas Harding is a 64 year old man s/p spinal surgery with reports of left foot numbness and spinal precautions. On evaluation patient demonstrates functional ROM, strength and coordination of upper and lower extremities and demonstrates ability to perform functional mobility and ADLs after instruction on spinal precautions and compensatory strategies. Patient demonstrated ability to donn lower body clothing by using figure 4 method, grooming using cup method and performed toilet transfer maintaining straight back and bending at the knees. Patient verbalized understanding of all instruction and compensatory strategies for ADLs including bathing, dressing, toileting and grooming and has handout to maximize retention of education. No further OT needs at this time.     Follow Up Recommendations  No OT follow up    Equipment Recommendations  None recommended by OT    Recommendations for Other Services       Precautions / Restrictions Precautions Precautions: Back;Fall Precaution Booklet Issued: Yes (comment) Precaution Comments: pt educated on BLT precautions  Restrictions Weight Bearing Restrictions: No      Mobility Bed Mobility Overal bed mobility: Modified Independent             General bed mobility comments: Able to perform bed mobility utilizing log roll technique.  Transfers Overall transfer level: Modified independent               General transfer comment: Able to perform in room ambulation and toilet transfer without assistance. Cueing to use knees to adher to spinal precautions with toilet transfer.    Balance Overall balance assessment: No apparent balance deficits (not formally  assessed)                                         ADL either performed or assessed with clinical judgement   ADL Overall ADL's : Needs assistance/impaired Eating/Feeding: Independent   Grooming: Cueing for compensatory techniques;Modified independent   Upper Body Bathing: Cueing for UE precautions;Modified independent   Lower Body Bathing: Cueing for compensatory techniques;Modified independent   Upper Body Dressing : Cueing for compensatory techniques;Modified independent   Lower Body Dressing: Cueing for compensatory techniques;Modified independent   Toilet Transfer: Cueing for sequencing;Modified Independent   Toileting- Clothing Manipulation and Hygiene: Modified independent;Cueing for compensatory techniques       Functional mobility during ADLs: Modified independent General ADL Comments: Patient able to donn shirt, boxers, shorts and socks and shoes with cueing for compensatory strategy and adhering to spinal precautions. No physical assistance needed.     Vision Baseline Vision/History: Wears glasses Wears Glasses: At all times       Perception     Praxis      Pertinent Vitals/Pain Pain Assessment: No/denies pain     Hand Dominance     Extremity/Trunk Assessment Upper Extremity Assessment Upper Extremity Assessment: Overall WFL for tasks assessed   Lower Extremity Assessment LLE Deficits / Details: Reports left foot numbness   Cervical / Trunk Assessment Cervical / Trunk Assessment: Other exceptions Cervical / Trunk Exceptions: s/p lumbar surgery   Communication Communication Communication: No difficulties   Cognition Arousal/Alertness: Awake/alert Behavior During Therapy: WFL for tasks assessed/performed Overall Cognitive Status:  Within Functional Limits for tasks assessed                                     General Comments       Exercises     Shoulder Instructions      Home Living Family/patient expects  to be discharged to:: Private residence Living Arrangements: Spouse/significant other Available Help at Discharge: Family Type of Home: House Home Access: Stairs to enter Technical brewer of Steps: 7 Entrance Stairs-Rails: Left Home Layout: Able to live on main level with bedroom/bathroom;Two level     Bathroom Shower/Tub: Engineer, manufacturing: Yes   Home Equipment: Civil engineer, contracting - built in          Prior Functioning/Environment Level of Independence: Independent                 OT Problem List:        OT Treatment/Interventions:      OT Goals(Current goals can be found in the care plan section) Acute Rehab OT Goals Patient Stated Goal: return to work OT Goal Formulation: All assessment and education complete, DC therapy  OT Frequency:     Barriers to D/C:            Co-evaluation              AM-PAC OT "6 Clicks" Daily Activity     Outcome Measure Help from another person eating meals?: None Help from another person taking care of personal grooming?: None Help from another person toileting, which includes using toliet, bedpan, or urinal?: None Help from another person bathing (including washing, rinsing, drying)?: None Help from another person to put on and taking off regular upper body clothing?: None Help from another person to put on and taking off regular lower body clothing?: None 6 Click Score: 24   End of Session Nurse Communication:  (OT education complete)  Activity Tolerance: Patient tolerated treatment well Patient left: in chair;with call bell/phone within reach  OT Visit Diagnosis: Muscle weakness (generalized) (M62.81)                Time: 4825-0037 OT Time Calculation (min): 31 min Charges:  OT General Charges $OT Visit: 1 Visit OT Evaluation $OT Eval Low Complexity: 1 Low OT Treatments $Self Care/Home Management : 8-22 mins  Asuna Peth, OTR/L [place]  Office  [phone] Pager: [phone]   Lenward Chancellor 05/19/2020, 9:05 AM

## 2020-05-19 NOTE — Plan of Care (Signed)
Patient alert and oriented, mae's well, voiding adequate amount of urine, swallowing without difficulty, no c/o pain at time of discharge. Patient discharged home with family. Script and discharged instructions given to patient. Patient and family stated understanding of instructions given. Patient has an appointment with Dr. Beane ?

## 2020-05-19 NOTE — Progress Notes (Signed)
Subjective: 1 Day Post-Op Procedure(s) (LRB): Microlumbar decompression Lumbar Four- Lumbar Five, Lumbar Five- Sacral One left (Left) Patient reports pain as mild.    Objective: Vital signs in last 24 hours: Temp:  [97.8 F (36.6 C)-98.8 F (37.1 C)] 98.1 F (36.7 C) (08/06 0722) Pulse Rate:  [47-99] 72 (08/06 0722) Resp:  [14-20] 18 (08/06 0722) BP: (104-135)/(60-80) 135/80 (08/06 0722) SpO2:  [94 %-99 %] 98 % (08/06 0722)  Intake/Output from previous day: 08/05 0701 - 08/06 0700 In: 1200 [I.V.:1100; IV Piggyback:100] Out: 1850 [Urine:1700; Blood:150] Intake/Output this shift: No intake/output data recorded.  No results for input(s): HGB in the last 72 hours. No results for input(s): WBC, RBC, HCT, PLT in the last 72 hours. Recent Labs    05/19/20 0644  NA 137  K 4.6  CL 102  CO2 24  BUN 20  CREATININE 1.34*  GLUCOSE 187*  CALCIUM 8.3*   No results for input(s): LABPT, INR in the last 72 hours.  Neurologically intact ABD soft Neurovascular intact Sensation intact distally Intact pulses distally Dorsiflexion/Plantar flexion intact Incision: dressing C/D/I No cellulitis present Compartment soft no calf pain or sign of DVt. EHL weakness noted (same as pre-op)   Assessment/Plan: 1 Day Post-Op Procedure(s) (LRB): Microlumbar decompression Lumbar Four- Lumbar Five, Lumbar Five- Sacral One left (Left) Advance diet Up with therapy D/C IV fluids Discussed dressing instructions, D/Cinstructions, Lspine precautions D/C home today Will discuss with Dr. Tonita Cong Follow up with PCP/cardiologist regarding pre-op PVCs  Thomas Harding 05/19/2020, 8:03 AM

## 2020-05-19 NOTE — Progress Notes (Signed)
Physical Therapy Treatment Patient Details Name: Thomas Harding MRN: [id number] DOB: 09/20/1956 Today's Date: 05/19/2020    History of Present Illness Pt is 64 yo male who is s/p L4/5 decompression surgery. Pt PMH was unremarkable except for HLD    PT Comments    Pt progressing well with post-op mobility. He was able to demonstrate transfers and ambulation with gross modified independence and no AD. Pt was educated on precautions, appropriate activity progression, and car transfer. He has met acute PT goals at this time and we will sign off. If needs change, please reconsult.       Follow Up Recommendations  Outpatient PT (once cleared by MD)     Equipment Recommendations  None recommended by PT    Recommendations for Other Services       Precautions / Restrictions Precautions Precautions: Back;Fall Precaution Booklet Issued: Yes (comment) Precaution Comments: Reviewed precautions during functional mobility.  Restrictions Weight Bearing Restrictions: No    Mobility  Bed Mobility Overal bed mobility: Modified Independent             General bed mobility comments: Pt was received sitting up in the recliner after OT session.   Transfers Overall transfer level: Modified independent Equipment used: None Transfers: Sit to/from Stand           General transfer comment: No assist required. Pt demonstrated proper hand placement on seated surface for safety.   Ambulation/Gait Ambulation/Gait assistance: Supervision approaching mod I Gait Distance (Feet): 400 Feet Assistive device: None Gait Pattern/deviations: Decreased dorsiflexion - left;Step-through pattern;Drifts right/left Gait velocity: decreased Gait velocity interpretation: 1.31 - 2.62 ft/sec, indicative of limited community ambulator General Gait Details: Occasional mild unsteadiness when first starting to ambulate. Pt with light touch on the railings in the hall but was able to move away from UE support by  the end of gait training. Balance appeared to improve however pt still demonstrating mild antalgia likely due to LLE weakness.    Stairs Stairs: Yes Stairs assistance: Modified independent (Device/Increase time) Stair Management: One rail Right;Alternating pattern;Forwards Number of Stairs: 10 General stair comments: VC's for general safety but pt able to negotiate stairs with mod I.    Wheelchair Mobility    Modified Rankin (Stroke Patients Only)       Balance Overall balance assessment: No apparent balance deficits (not formally assessed) Sitting-balance support: No upper extremity supported;Feet supported Sitting balance-Leahy Scale: Fair Sitting balance - Comments: pt able to sit at EOB without UE support   Standing balance support: During functional activity;Single extremity supported Standing balance-Leahy Scale: Poor Standing balance comment: pt required min assist when coming to standing. pt required UE support when ambulating                             Cognition Arousal/Alertness: Awake/alert Behavior During Therapy: WFL for tasks assessed/performed Overall Cognitive Status: Within Functional Limits for tasks assessed                                        Exercises      General Comments        Pertinent Vitals/Pain Pain Assessment: Faces Faces Pain Scale: Hurts a little bit Pain Location: back Pain Descriptors / Indicators: Aching;Constant;Operative site guarding Pain Intervention(s): Monitored during session    [place] expects to be discharged to:: Private residence  Living Arrangements: Spouse/significant other Available Help at Discharge: Family Type of Home: House Home Access: Stairs to enter Entrance Stairs-Rails: Left Home Layout: Able to live on main level with bedroom/bathroom;Two level Home Equipment: Shower seat - built in      Prior Function Level of Independence: Independent           PT Goals (current goals can now be found in the care plan section) Acute Rehab PT Goals Patient Stated Goal: return to work PT Goal Formulation: With patient/family Time For Goal Achievement: 06/01/20 Potential to Achieve Goals: Good Progress towards PT goals: Goals met/education completed, patient discharged from PT    Frequency    Min 5X/week      PT Plan Current plan remains appropriate    Co-evaluation              AM-PAC PT "6 Clicks" Mobility   Outcome Measure  Help needed turning from your back to your side while in a flat bed without using bedrails?: None Help needed moving from lying on your back to sitting on the side of a flat bed without using bedrails?: None Help needed moving to and from a bed to a chair (including a wheelchair)?: None Help needed standing up from a chair using your arms (e.g., wheelchair or bedside chair)?: None Help needed to walk in hospital room?: None Help needed climbing 3-5 steps with a railing? : None 6 Click Score: 24    End of Session Equipment Utilized During Treatment: Gait belt Activity Tolerance: Patient tolerated treatment well Patient left: in bed;with call bell/phone within reach Nurse Communication: Mobility status PT Visit Diagnosis: Unsteadiness on feet (R26.81);Other abnormalities of gait and mobility (R26.89);Muscle weakness (generalized) (M62.81);Pain Pain - part of body:  (back)     Time: 1423-9532 PT Time Calculation (min) (ACUTE ONLY): 25 min  Charges:  $Gait Training: 23-37 mins                     Rolinda Roan, PT, DPT Acute Rehabilitation Services Pager: [phone] Office: [phone]    Thelma Comp 05/19/2020, 9:44 AM

## 2020-05-19 NOTE — Telephone Encounter (Signed)
Please order fasting labs for cpe scheduled for 07/26/2020

## 2020-05-19 NOTE — Discharge Summary (Signed)
Physician Discharge Summary   Patient ID: Thomas Harding MRN: [id number] DOB/AGE: 63-Oct-1957 63 y.o.  Admit date: 05/18/2020 Discharge date: 05/19/2020  Primary Diagnosis:   Stenosis Lumbar Five-Sacral One  Admission Diagnoses:  Past Medical History:  Diagnosis Date  . Hyperlipidemia    Discharge Diagnoses:   Active Problems:   Spinal stenosis at L4-L5 level  Procedure:  Procedure(s) (LRB): Microlumbar decompression Lumbar Four- Lumbar Five, Lumbar Five- Sacral One left (Left)   Consults: None  HPI:  see H&P    Laboratory Data: Hospital Outpatient Visit on 05/16/2020  Component Date Value Ref Range Status  . SARS Coronavirus 2 05/16/2020 NEGATIVE  NEGATIVE Final   Comment: (NOTE) SARS-CoV-2 target nucleic acids are NOT DETECTED.  The SARS-CoV-2 RNA is generally detectable in upper and lower respiratory specimens during the acute phase of infection. Negative results do not preclude SARS-CoV-2 infection, do not rule out co-infections with other pathogens, and should not be used as the sole basis for treatment or other patient management decisions. Negative results must be combined with clinical observations, patient history, and epidemiological information. The expected result is Negative.  Fact Sheet for Patients: SugarRoll.be  Fact Sheet for Healthcare Providers: https://www.woods-mathews.com/  This test is not yet approved or cleared by the [place] FDA and  has been authorized for detection and/or diagnosis of SARS-CoV-2 by FDA under an Emergency Use Authorization (EUA). This EUA will remain  in effect (meaning this test can be used) for the duration of the COVID-19 declaration under Se                          ction 564(b)(1) of the Act, 21 U.S.C. section 360bbb-3(b)(1), unless the authorization is terminated or revoked sooner.  Performed at [facility], [place] [address], [place], [place] [address]     No results for input(s): HGB in the last 72 hours. No results for input(s): WBC, RBC, HCT, PLT in the last 72 hours. Recent Labs    05/19/20 0644  NA 137  K 4.6  CL 102  CO2 24  BUN 20  CREATININE 1.34*  GLUCOSE 187*  CALCIUM 8.3*   No results for input(s): LABPT, INR in the last 72 hours.  X-Rays:DG Lumbar Spine 2-3 Views  Result Date: 05/18/2020 CLINICAL DATA:  Surgical localization EXAM: LUMBAR SPINE - 2-3 VIEW COMPARISON:  05/09/2020 FINDINGS: Three lateral intraoperative radiographs of the lumbar spine were obtained, labeled film 1 through 3. In correlation with prior radiograph 05/09/2020, there are 5 lumbar type vertebral segments with the inferior-most well developed disc space designated as L5-S1. Film 1 demonstrates surgical instrumentation within the superficial soft tissues posterior to the L4-5 and L5-S1 levels. Film 2 demonstrates surgical probes, the more superior of which is located posterior to the L5 vertebral body near the inferior margin of the L4-5 facet joint, and the more inferior of which is located posterior to the S1 level near the inferior margin of the L5-S1 facet joint. Film 3 demonstrates a surgical instrument at the level of the L5-S1 disc space. An additional surgical instrument is angulated cranially at the level of the inferior margin of the L4-5 disc space. IMPRESSION: Intraoperative surgical instrumentation, as above. Electronically Signed   By: Davina Poke D.O.   On: 05/18/2020 10:54   DG Lumbar Spine 2-3 Views  Result Date: 05/10/2020 CLINICAL DATA:  Preop EXAM: LUMBAR SPINE - 2-3 VIEW COMPARISON:  February 05, 2010 FINDINGS: There are five  non-rib bearing lumbar-type vertebral bodies with riblets at T12. No spondylolisthesis. Mild levocurvature of the lumbar spine. There is no evidence for acute fracture or subluxation. Mild-to-moderate multilevel intervertebral disc space height loss, most pronounced at L3-4. Visualized abdomen is unremarkable.  IMPRESSION: Mild-to-moderate multilevel spondylosis. Electronically Signed   By: Valentino Saxon MD   On: 05/10/2020 11:40    EKG: Orders placed or performed in visit on 09/02/16  . Exercise Tolerance Test     Hospital Course: Patient was admitted to [facility] and taken to the OR and underwent the above state procedure without complications.  Patient tolerated the procedure well and was later transferred to the recovery room and then to the orthopaedic floor for postoperative care.  They were given PO and IV analgesics for pain control following their surgery.  They were given 24 hours of postoperative antibiotics.   PT was consulted postop to assist with mobility and transfers.  The patient was allowed to be WBAT with therapy and was taught back precautions. Discharge planning was consulted to help with postop disposition and equipment needs.  Patient had a good night on the evening of surgery and started to get up OOB with therapy on day one. Patient was seen in rounds and was ready to go home on day one.  They were given discharge instructions and dressing directions.  They were instructed on when to follow up in the office with Dr. Tonita Cong.   Diet: Regular diet Activity:WBAT, Lspine precautions Follow-up:in 10-14 days Disposition - Home Discharged Condition: good   Discharge Instructions    Call MD / Call 911   Complete by: As directed    If you experience chest pain or shortness of breath, CALL 911 and be transported to the hospital emergency room.  If you develope a fever above 101 F, pus (white drainage) or increased drainage or redness at the wound, or calf pain, call your surgeon's office.   Constipation Prevention   Complete by: As directed    Drink plenty of fluids.  Prune juice may be helpful.  You may use a stool softener, such as Colace (over the counter) 100 mg twice a day.  Use MiraLax (over the counter) for constipation as needed.   Diet - low sodium heart  healthy   Complete by: As directed    Increase activity slowly as tolerated   Complete by: As directed      Allergies as of 05/19/2020      Reactions   Bee Venom Anaphylaxis   Penicillins Hives, Swelling      Medication List    STOP taking these medications   Advil Dual Action 125-250 MG Tabs Generic drug: Ibuprofen-Acetaminophen   atorvastatin 10 MG tablet Commonly known as: LIPITOR     TAKE these medications   ALPHA LIPOIC ACID PO Take 2 capsules by mouth daily.   docusate sodium 100 MG capsule Commonly known as: Colace Take 1 capsule (100 mg total) by mouth 2 (two) times daily for 14 days.   gabapentin 300 MG capsule Commonly known as: NEURONTIN Take 300 mg by mouth in the morning, at noon, in the evening, and at bedtime.   omeprazole 20 MG tablet Commonly known as: PRILOSEC OTC Take 20 mg by mouth daily.   oxyCODONE 5 MG immediate release tablet Commonly known as: Oxy IR/ROXICODONE Take 1-2 tablets (5-10 mg total) by mouth every 4 (four) hours as needed for severe pain.   polyethylene glycol 17 g packet Commonly known as:  MIRALAX / GLYCOLAX Take 17 g by mouth daily.       Follow-up Information    Susa Day, MD Follow up in 2 week(s).   Specialty: Orthopedic Surgery Contact information: [address] [place] [address] [phone]        Susa Day, MD In 2 weeks.   Specialty: Orthopedic Surgery Contact information: [address] [place] [address] [phone]               Signed: Lacie Draft, PA-C Orthopaedic Surgery 05/19/2020, 8:05 AM

## 2020-05-26 ENCOUNTER — Ambulatory Visit (INDEPENDENT_AMBULATORY_CARE_PROVIDER_SITE_OTHER): Payer: BLUE CROSS/BLUE SHIELD | Admitting: Family Medicine

## 2020-05-26 ENCOUNTER — Encounter: Payer: Self-pay | Admitting: Family Medicine

## 2020-05-26 ENCOUNTER — Other Ambulatory Visit: Payer: Self-pay

## 2020-05-26 VITALS — BP 132/74 | HR 74 | Temp 97.8°F | Resp 15 | Ht 71.0 in | Wt 222.0 lb

## 2020-05-26 DIAGNOSIS — I493 Ventricular premature depolarization: Secondary | ICD-10-CM

## 2020-05-26 DIAGNOSIS — R001 Bradycardia, unspecified: Secondary | ICD-10-CM | POA: Diagnosis not present

## 2020-05-26 DIAGNOSIS — R0683 Snoring: Secondary | ICD-10-CM | POA: Diagnosis not present

## 2020-05-26 NOTE — Patient Instructions (Addendum)
  I will refer you to cardiology to discuss the recent arrhythmia prior to surgery.  Heart rhythm looks okay today and previous cardiac evaluation was reassuring.  I will also refer you to sleep specialist to evaluate for possible obstructive sleep apnea.  If any new symptoms be seen sooner.  Return to the clinic or go to the nearest emergency room if any of your symptoms worsen or new symptoms occur.    If you have lab work done today you will be contacted with your lab results within the next 2 weeks.  If you have not heard from [place] then please contact us. The fastest way to get your results is to register for My Chart.   IF you received an x-ray today, you will receive an invoice from  [facility]. Please contact [facility] at [phone] with questions or concerns regarding your invoice.   IF you received labwork today, you will receive an invoice from [facility]. Please contact [facility] at [phone] with questions or concerns regarding your invoice.   Our billing staff will not be able to assist you with questions regarding bills from these companies.  You will be contacted with the lab results as soon as they are available. The fastest way to get your results is to activate your My Chart account. Instructions are located on the last page of this paperwork. If you have not heard from [place] regarding the results in 2 weeks, please contact this office.

## 2020-05-26 NOTE — Progress Notes (Signed)
Subjective:  Patient ID: Thomas Harding, male    DOB: 1955/11/28  Age: 64 y.o. MRN: [id number]  CC:  Chief Complaint  Patient presents with  . irregular heartbeat    pt went in for surgery, pt heart rate was very low, had 32 irregular heartbeats PVC, and was advised to follow up with PCP, pt surgical notes are avalible in chart review    HPI Thomas Harding presents for   Irregular heart rate:  Underwent microlumbar decompression of lumbar fourth the fifth and lumbar fifth for sacral on the left.  Admitted 05/18/2020 through 05/19/2020.  Dr. Tonita Cong.  During preop - pulse was 40 to 44. Had 12 lead EKG, had "38 pvcs at one point in time", asymptomatic at that time. Proceeded with surgery - no issues.   Evaluated by [facility] cardiovascular in 2017 for dyspnea on exertion.  Stress test and echo reassuring at that time: Echocardiogram 09/12/2016 normal left ventricular cavity size, normal wall motion, EF 62%.  Trace mitral regurgitation, mild tricuspid regurgitation, no evidence of pulmonary hypertension, study was within normal limits. Exercise treadmill test September 02, 2016.  Bruce protocol, 8-minute exercise, 10.16 METS of work, hypertensive BP response but no ST/T wave changes/ischemia with exercise stress testing.  No significant arrhythmias.   Occasional snoring, friend noticed pauses in breathing. Feels rested during day. No prior testing for sleep apnea.   History Patient Active Problem List   Diagnosis Date Noted  . Spinal stenosis at L4-L5 level 05/18/2020  . Shortness of breath 11/22/2016   Past Medical History:  Diagnosis Date  . Hyperlipidemia    Past Surgical History:  Procedure Laterality Date  . LUMBAR LAMINECTOMY/DECOMPRESSION MICRODISCECTOMY Left 05/18/2020   Procedure: Microlumbar decompression Lumbar Four- Lumbar Five, Lumbar Five- Sacral One left;  Surgeon: Susa Day, MD;  Location: [place];  Service: Orthopedics;  Laterality: Left;  Microlumbar decompression  Lumbar Five- Sacral One left  . ROTATOR CUFF REPAIR Right   . thumb surgery Left   . TONSILLECTOMY    . VASECTOMY     Allergies  Allergen Reactions  . Bee Venom Anaphylaxis  . Penicillins Hives and Swelling   Prior to Admission medications   Medication Sig Start Date End Date Taking? Authorizing Provider  ALPHA LIPOIC ACID PO Take 2 capsules by mouth daily.   Yes [provider]  gabapentin (NEURONTIN) 300 MG capsule Take 300 mg by mouth in the morning, at noon, in the evening, and at bedtime.   Yes [provider]  polyethylene glycol (MIRALAX / GLYCOLAX) 17 g packet Take 17 g by mouth daily. 05/18/20  Yes Susa Day, MD   Social History   Socioeconomic History  . Marital status: Single    Spouse name: Not on file  . Number of children: Not on file  . Years of education: Not on file  . Highest education level: Not on file  Occupational History  . Not on file  Tobacco Use  . Smoking status: Never Smoker  . Smokeless tobacco: Never Used  Vaping Use  . Vaping Use: Never used  Substance and Sexual Activity  . Alcohol use: Yes    Comment: 2 per month  . Drug use: No  . Sexual activity: Yes  Other Topics Concern  . Not on file  Social History Narrative  . Not on file   Social Determinants of Health   Financial Resource Strain:   . Difficulty of Paying Living Expenses:   Food Insecurity:   .  Worried About Charity fundraiser in the Last Year:   . Arboriculturist in the Last Year:   Transportation Needs:   . Film/video editor (Medical):   Marland Kitchen Lack of Transportation (Non-Medical):   Physical Activity:   . Days of Exercise per Week:   . Minutes of Exercise per Session:   Stress:   . Feeling of Stress :   Social Connections:   . Frequency of Communication with Friends and Family:   . Frequency of Social Gatherings with Friends and Family:   . Attends Religious Services:   . Active Member of Clubs or Organizations:   . Attends Theatre manager Meetings:   Marland Kitchen Marital Status:   Intimate Partner Violence:   . Fear of Current or Ex-Partner:   . Emotionally Abused:   Marland Kitchen Physically Abused:   . Sexually Abused:     Review of Systems  Constitutional: Negative for fatigue and unexpected weight change.  Eyes: Negative for visual disturbance.  Respiratory: Negative for cough, chest tightness and shortness of breath.   Cardiovascular: Negative for chest pain, palpitations and leg swelling.  Gastrointestinal: Negative for abdominal pain and blood in stool.  Neurological: Negative for dizziness, light-headedness and headaches.    Per HPI.  Objective:   Vitals:   05/26/20 0807 05/26/20 0839  BP: (!) 153/75 132/74  Pulse: 74   Resp: 15   Temp: 97.8 F (36.6 C)   TempSrc: Temporal   SpO2: 99%   Weight: 222 lb (100.7 kg)   Height: 5\' 11"  (1.803 m)      Physical Exam Vitals reviewed.  Constitutional:      Appearance: He is well-developed.  HENT:     Head: Normocephalic and atraumatic.  Eyes:     Pupils: Pupils are equal, round, and reactive to light.  Neck:     Vascular: No carotid bruit or JVD.  Cardiovascular:     Rate and Rhythm: Normal rate and regular rhythm.     Heart sounds: Normal heart sounds. No murmur heard.   Pulmonary:     Effort: Pulmonary effort is normal.     Breath sounds: Normal breath sounds. No rales.  Skin:    General: Skin is warm and dry.  Neurological:     Mental Status: He is alert and oriented to person, place, and time.      EKG sinus rhythm, rate 59.  No PVCs noted, no apparent ST or T wave changes, no change from EKG 08/05/2016.  38 minutes spent during visit, greater than 50% counseling and assimilation of information, chart review, and discussion of plan.   Assessment & Plan:  Thomas Harding is a 64 y.o. male . PVC (premature ventricular contraction) - Plan: EKG 12-Lead, Ambulatory referral to Cardiology, Ambulatory referral to Sleep Studies  Bradycardia - Plan:  Ambulatory referral to Cardiology, Ambulatory referral to Sleep Studies  Snoring - Plan: Ambulatory referral to Sleep Studies   No concerning findings on EKG at present.  Asymptomatic at previous time with bradycardia and PVCs.  Potentially may have been related to stress preoperatively.  Prior cardiac evaluation with stress test and echo a few years ago was reassuring.  Will refer back to cardiology, but also referred to sleep specialist given intermittent snoring, reported pauses, suspicious for OSA.  RTC/ER precautions.  No orders of the defined types were placed in this encounter.  Patient Instructions    I will refer you to cardiology to discuss the recent arrhythmia  prior to surgery.  Heart rhythm looks okay today and previous cardiac evaluation was reassuring.  I will also refer you to sleep specialist to evaluate for possible obstructive sleep apnea.  If any new symptoms be seen sooner.  Return to the clinic or go to the nearest emergency room if any of your symptoms worsen or new symptoms occur.    If you have lab work done today you will be contacted with your lab results within the next 2 weeks.  If you have not heard from [place] then please contact us. The fastest way to get your results is to register for My Chart.   IF you received an x-ray today, you will receive an invoice from [facility]. Please contact [facility] Radiology at [phone] with questions or concerns regarding your invoice.   IF you received labwork today, you will receive an invoice from [place]. Please contact [facility] at [phone] with questions or concerns regarding your invoice.   Our billing staff will not be able to assist you with questions regarding bills from these companies.  You will be contacted with the lab results as soon as they are available. The fastest way to get your results is to activate your My Chart account. Instructions are located on the last page of this paperwork. If you  have not heard from [place] regarding the results in 2 weeks, please contact this office.         Signed, Merri Ray, MD [facility]

## 2020-06-14 ENCOUNTER — Ambulatory Visit: Payer: Self-pay | Admitting: Cardiology

## 2020-06-14 DIAGNOSIS — G8314 Monoplegia of lower limb affecting left nondominant side: Secondary | ICD-10-CM | POA: Diagnosis not present

## 2020-06-22 ENCOUNTER — Encounter: Payer: Self-pay | Admitting: Cardiology

## 2020-06-22 ENCOUNTER — Other Ambulatory Visit: Payer: Self-pay

## 2020-06-22 ENCOUNTER — Ambulatory Visit: Payer: BLUE CROSS/BLUE SHIELD

## 2020-06-22 ENCOUNTER — Ambulatory Visit: Payer: BLUE CROSS/BLUE SHIELD | Admitting: Cardiology

## 2020-06-22 VITALS — BP 136/85 | HR 73 | Resp 17 | Ht 71.0 in | Wt 226.0 lb

## 2020-06-22 DIAGNOSIS — R001 Bradycardia, unspecified: Secondary | ICD-10-CM

## 2020-06-22 DIAGNOSIS — I493 Ventricular premature depolarization: Secondary | ICD-10-CM | POA: Insufficient documentation

## 2020-06-22 NOTE — Progress Notes (Signed)
Patient referred by [name], MD for bradycardia  Subjective:   Thomas Harding, male    DOB: 07/18/1956, 64 y.o.   MRN: [id number]   Chief Complaint  Patient presents with  . PVC  . Bradycardia  . New Patient (Initial Visit)     HPI  64 y.o. Caucasian male with bradycardia/PVC   Patient recently underwent back surgery.  Preoperatively, he was noticed to have frequent PVCs and bradycardia.  He was therefore referred for further evaluation.  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  He denies any exertional chest pain, overt dyspnea, presyncope, syncope.  He previously underwent echocardiogram exercise stress test 2017, both were unremarkable.  Past Medical History:  Diagnosis Date  . Hyperlipidemia      Past Surgical History:  Procedure Laterality Date  . LUMBAR LAMINECTOMY/DECOMPRESSION MICRODISCECTOMY Left 05/18/2020   Procedure: Microlumbar decompression Lumbar Four- Lumbar Five, Lumbar Five- Sacral One left;  Surgeon: Susa Day, MD;  Location: [place];  Service: Orthopedics;  Laterality: Left;  Microlumbar decompression Lumbar Five- Sacral One left  . ROTATOR CUFF REPAIR Right   . thumb surgery Left   . TONSILLECTOMY    . VASECTOMY       Social History   Tobacco Use  Smoking Status Never Smoker  Smokeless Tobacco Never Used    Social History   Substance and Sexual Activity  Alcohol Use Yes   Comment: 2 per month     Family History  Problem Relation Age of Onset  . Hypertension Mother   . Hyperlipidemia Father      Current Outpatient Medications on File Prior to Visit  Medication Sig Dispense Refill  . ALPHA LIPOIC ACID PO Take 2 capsules by mouth daily.    Marland Kitchen gabapentin (NEURONTIN) 300 MG capsule Take 300 mg by mouth in the morning, at noon, in the evening, and at bedtime.    . polyethylene glycol (MIRALAX / GLYCOLAX) 17 g packet Take 17 g by mouth daily. 14 each 0   No current facility-administered  medications on file prior to visit.    Cardiovascular and other pertinent studies:  EKG 06/22/2020: Sinus rhythm 81 bpm Frequent ectopic ventricular beats  Possible old anteroseptal infarct  EKG 05/26/2020: Sinus rhythm 59 bpm Normal EKG  Echocardiogram 2017: Normal LVEF. Mild TR.   Exercise treadmill stress test 2017: 10.1 METS. No ischemia   Recent labs: 05/19/2020: Glucose 187, BUN/Cr 20/1.34. EGFR 56. Na/K 137/4.6. Albumin 3.3. Rest of the CMP normal H/H 13/43. MCV 89. Platelets 206   Review of Systems  Cardiovascular: Negative for chest pain, dyspnea on exertion, leg swelling, palpitations and syncope.         Vitals:   06/22/20 1347  BP: 136/85  Pulse: 73  Resp: 17  SpO2: 97%     Body mass index is 31.52 kg/m. Filed Weights   06/22/20 1347  Weight: 226 lb (102.5 kg)     Objective:   Physical Exam Vitals and nursing note reviewed.  Constitutional:      General: He is not in acute distress. Neck:     Vascular: No JVD.  Cardiovascular:     Rate and Rhythm: Normal rate and regular rhythm. Occasional extrasystoles are present.    Heart sounds: Normal heart sounds. No murmur heard.   Pulmonary:     Effort: Pulmonary effort is normal.     Breath sounds: Normal breath sounds. No wheezing or rales.  Assessment & Recommendations:   64 y.o. 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  He is completely asymptomatic.  Will obtain baseline echocardiogram and 7-week telemetry to evaluate for PVC burden.  Unless PVC burden > 15%, or reduced LVEF noted, I do not think he will necessarily need treatment for this.   Further recommendations after above testing.   Thank you for referring the patient to [place]. Please feel free to contact with any questions.   Nigel Mormon, MD Pager: [phone] Office: [phone]

## 2020-06-26 ENCOUNTER — Other Ambulatory Visit (HOSPITAL_COMMUNITY): Payer: Self-pay | Admitting: Specialist

## 2020-06-26 DIAGNOSIS — T79A22A Traumatic compartment syndrome of left lower extremity, initial encounter: Secondary | ICD-10-CM

## 2020-06-26 DIAGNOSIS — M5416 Radiculopathy, lumbar region: Secondary | ICD-10-CM | POA: Diagnosis not present

## 2020-06-26 DIAGNOSIS — Z9889 Other specified postprocedural states: Secondary | ICD-10-CM

## 2020-06-27 ENCOUNTER — Other Ambulatory Visit: Payer: Self-pay

## 2020-06-27 ENCOUNTER — Ambulatory Visit (HOSPITAL_COMMUNITY)
Admission: RE | Admit: 2020-06-27 | Discharge: 2020-06-27 | Disposition: A | Discharge status: Home / Self Care | Payer: BLUE CROSS/BLUE SHIELD | Source: Ambulatory Visit | Attending: Specialist | Admitting: Specialist

## 2020-06-27 DIAGNOSIS — T79A22A Traumatic compartment syndrome of left lower extremity, initial encounter: Secondary | ICD-10-CM | POA: Diagnosis not present

## 2020-06-27 DIAGNOSIS — Z9889 Other specified postprocedural states: Secondary | ICD-10-CM | POA: Diagnosis not present

## 2020-06-27 NOTE — Progress Notes (Signed)
Lower extremity venous has been completed.   Preliminary results in CV Proc.   Abram Sander 06/27/2020 8:22 AM

## 2020-06-28 DIAGNOSIS — M5136 Other intervertebral disc degeneration, lumbar region: Secondary | ICD-10-CM | POA: Diagnosis not present

## 2020-06-28 DIAGNOSIS — Z4889 Encounter for other specified surgical aftercare: Secondary | ICD-10-CM | POA: Diagnosis not present

## 2020-07-03 ENCOUNTER — Institutional Professional Consult (permissible substitution): Payer: BLUE CROSS/BLUE SHIELD | Admitting: Neurology

## 2020-07-04 ENCOUNTER — Other Ambulatory Visit: Payer: Self-pay

## 2020-07-04 ENCOUNTER — Encounter: Payer: Self-pay | Admitting: Family Medicine

## 2020-07-04 ENCOUNTER — Ambulatory Visit: Payer: BLUE CROSS/BLUE SHIELD

## 2020-07-04 DIAGNOSIS — I493 Ventricular premature depolarization: Secondary | ICD-10-CM

## 2020-07-05 DIAGNOSIS — M5416 Radiculopathy, lumbar region: Secondary | ICD-10-CM | POA: Diagnosis not present

## 2020-07-10 DIAGNOSIS — M5416 Radiculopathy, lumbar region: Secondary | ICD-10-CM | POA: Diagnosis not present

## 2020-07-10 NOTE — Progress Notes (Signed)
Spoke to patient, patient is aware sch/rma

## 2020-07-14 ENCOUNTER — Telehealth: Payer: Self-pay

## 2020-07-14 DIAGNOSIS — I493 Ventricular premature depolarization: Secondary | ICD-10-CM | POA: Diagnosis not present

## 2020-07-14 NOTE — Progress Notes (Signed)
Discussed the results with the patient.  Recommend 1 year f/u for asymptomatic PVC's.  Thanks MJP

## 2020-07-14 NOTE — Telephone Encounter (Signed)
Pt called wanted to know the results about his monitor. could you read it and send me the results to inform him please.

## 2020-07-18 DIAGNOSIS — M5416 Radiculopathy, lumbar region: Secondary | ICD-10-CM | POA: Diagnosis not present

## 2020-07-21 ENCOUNTER — Other Ambulatory Visit: Payer: Self-pay

## 2020-07-21 ENCOUNTER — Ambulatory Visit (INDEPENDENT_AMBULATORY_CARE_PROVIDER_SITE_OTHER): Payer: BLUE CROSS/BLUE SHIELD | Admitting: Family Medicine

## 2020-07-21 DIAGNOSIS — Z0000 Encounter for general adult medical examination without abnormal findings: Secondary | ICD-10-CM | POA: Diagnosis not present

## 2020-07-22 LAB — CBC WITH DIFFERENTIAL/PLATELET
Basophils Absolute: 0.1 10*3/uL (ref 0.0–0.2)
Basos: 1 %
EOS (ABSOLUTE): 0.4 10*3/uL (ref 0.0–0.4)
Eos: 7 %
Hematocrit: 43.8 % (ref 37.5–51.0)
Hemoglobin: 14.6 g/dL (ref 13.0–17.7)
Immature Grans (Abs): 0 10*3/uL (ref 0.0–0.1)
Immature Granulocytes: 0 %
Lymphocytes Absolute: 1.6 10*3/uL (ref 0.7–3.1)
Lymphs: 27 %
MCH: 28.7 pg (ref 26.6–33.0)
MCHC: 33.3 g/dL (ref 31.5–35.7)
MCV: 86 fL (ref 79–97)
Monocytes Absolute: 0.6 10*3/uL (ref 0.1–0.9)
Monocytes: 10 %
Neutrophils Absolute: 3.2 10*3/uL (ref 1.4–7.0)
Neutrophils: 55 %
Platelets: 240 10*3/uL (ref 150–450)
RBC: 5.08 x10E6/uL (ref 4.14–5.80)
RDW: 11.8 % (ref 11.6–15.4)
WBC: 5.8 10*3/uL (ref 3.4–10.8)

## 2020-07-22 LAB — HEMOGLOBIN A1C
Est. average glucose Bld gHb Est-mCnc: 111 mg/dL
Hgb A1c MFr Bld: 5.5 % (ref 4.8–5.6)

## 2020-07-22 LAB — LIPID PANEL
Chol/HDL Ratio: 5.6 ratio — ABNORMAL HIGH (ref 0.0–5.0)
Cholesterol, Total: 203 mg/dL — ABNORMAL HIGH (ref 100–199)
HDL: 36 mg/dL — ABNORMAL LOW (ref >39)
LDL Chol Calc (NIH): 127 mg/dL — ABNORMAL HIGH (ref 0–99)
Triglycerides: 226 mg/dL — ABNORMAL HIGH (ref 0–149)
VLDL Cholesterol Cal: 40 mg/dL (ref 5–40)

## 2020-07-22 LAB — COMPREHENSIVE METABOLIC PANEL
ALT: 23 IU/L (ref 0–44)
AST: 21 IU/L (ref 0–40)
Albumin/Globulin Ratio: 1.5 (ref 1.2–2.2)
Albumin: 4.2 g/dL (ref 3.8–4.8)
Alkaline Phosphatase: 100 IU/L (ref 44–121)
BUN/Creatinine Ratio: 12 (ref 10–24)
BUN: 14 mg/dL (ref 8–27)
Bilirubin Total: 0.7 mg/dL (ref 0.0–1.2)
CO2: 25 mmol/L (ref 20–29)
Calcium: 9.7 mg/dL (ref 8.6–10.2)
Chloride: 101 mmol/L (ref 96–106)
Creatinine, Ser: 1.21 mg/dL (ref 0.76–1.27)
GFR calc Af Amer: 73 mL/min/{1.73_m2} (ref >59)
GFR calc non Af Amer: 63 mL/min/{1.73_m2} (ref >59)
Globulin, Total: 2.8 g/dL (ref 1.5–4.5)
Glucose: 99 mg/dL (ref 65–99)
Potassium: 4.8 mmol/L (ref 3.5–5.2)
Sodium: 141 mmol/L (ref 134–144)
Total Protein: 7 g/dL (ref 6.0–8.5)

## 2020-07-22 LAB — PSA: Prostate Specific Ag, Serum: 1.7 ng/mL (ref 0.0–4.0)

## 2020-07-24 DIAGNOSIS — M5416 Radiculopathy, lumbar region: Secondary | ICD-10-CM | POA: Diagnosis not present

## 2020-07-26 ENCOUNTER — Other Ambulatory Visit: Payer: Self-pay

## 2020-07-26 ENCOUNTER — Encounter: Payer: Self-pay | Admitting: Family Medicine

## 2020-07-26 ENCOUNTER — Ambulatory Visit (INDEPENDENT_AMBULATORY_CARE_PROVIDER_SITE_OTHER): Payer: BLUE CROSS/BLUE SHIELD | Admitting: Family Medicine

## 2020-07-26 VITALS — BP 124/78 | HR 68 | Temp 98.4°F | Ht 71.0 in | Wt 230.0 lb

## 2020-07-26 DIAGNOSIS — Z23 Encounter for immunization: Secondary | ICD-10-CM | POA: Diagnosis not present

## 2020-07-26 DIAGNOSIS — E782 Mixed hyperlipidemia: Secondary | ICD-10-CM

## 2020-07-26 DIAGNOSIS — R42 Dizziness and giddiness: Secondary | ICD-10-CM | POA: Diagnosis not present

## 2020-07-26 DIAGNOSIS — Z0001 Encounter for general adult medical examination with abnormal findings: Secondary | ICD-10-CM

## 2020-07-26 DIAGNOSIS — Z0000 Encounter for general adult medical examination without abnormal findings: Secondary | ICD-10-CM

## 2020-07-26 MED ORDER — PRAVASTATIN SODIUM 20 MG PO TABS: 20.0000 mg | ORAL_TABLET | Freq: Every day | ORAL | 1 refills | Status: DC

## 2020-07-26 NOTE — Patient Instructions (Addendum)
I do recommend following up with sleep specialist. Continue to drink plenty of fluids, and breakfast each day. If continued lightheaded/cloudy feeling it may be worth discussing with neurology. Let me know. Return to the clinic or go to the nearest emergency room if any of your symptoms worsen or new symptoms occur.  Repeat psa in 6 months, return sooner if new urinary symptoms.  Can start pravastatin for cholesterol few days per week, then increase to daily if tolerated.  6 week lab only visit.   Keeping you healthy  Get these tests  Blood pressure- Have your blood pressure checked once a year by your healthcare provider.  Normal blood pressure is 120/80  Weight- Have your body mass index (BMI) calculated to screen for obesity.  BMI is a measure of body fat based on height and weight. You can also calculate your own BMI at [employer]si.  Cholesterol- Have your cholesterol checked every year.  Diabetes- Have your blood sugar checked regularly if you have high blood pressure, high cholesterol, have a family history of diabetes or if you are overweight.  Screening for Colon Cancer- Colonoscopy starting at age 56.  Screening may begin sooner depending on your family history and other health conditions. Follow up colonoscopy as directed by your Gastroenterologist.  Screening for Prostate Cancer- Both blood work (PSA) and a rectal exam help screen for Prostate Cancer.  Screening begins at age 43 with African-American men and at age 16 with Caucasian men.  Screening may begin sooner depending on your family history.  Take these medicines  Aspirin- One aspirin daily can help prevent Heart disease and Stroke.  Flu shot- Every fall.  Tetanus- Every 10 years.  Zostavax- Once after the age of 17 to prevent Shingles.  Pneumonia shot- Once after the age of 14; if you are younger than 60, ask your healthcare provider if you need a Pneumonia shot.  Take these steps  Don't smoke-  If you do smoke, talk to your doctor about quitting.  For tips on how to quit, go to www.smokefree.gov or call 1-800-QUIT-NOW.  Be physically active- Exercise 5 days a week for at least 30 minutes.  If you are not already physically active start slow and gradually work up to 30 minutes of moderate physical activity.  Examples of moderate activity include walking briskly, mowing the yard, dancing, swimming, bicycling, etc.  Eat a healthy diet- Eat a variety of healthy food such as fruits, vegetables, low fat milk, low fat cheese, yogurt, lean meant, poultry, fish, beans, tofu, etc. For more information go to www.thenutritionsource.org  Drink alcohol in moderation- Limit alcohol intake to less than two drinks a day. Never drink and drive.  Dentist- Brush and floss twice daily; visit your dentist twice a year.  Depression- Your emotional health is as important as your physical health. If you're feeling down, or losing interest in things you would normally enjoy please talk to your healthcare provider.  Eye exam- Visit your eye doctor every year.  Safe sex- If you may be exposed to a sexually transmitted infection, use a condom.  Seat belts- Seat belts can save your life; always wear one.  Smoke/Carbon Monoxide detectors- These detectors need to be installed on the appropriate level of your home.  Replace batteries at least once a year.  Skin cancer- When out in the sun, cover up and use sunscreen 15 SPF or higher.  Violence- If anyone is threatening you, please tell your healthcare provider.  Living Will/ Health care  power of attorney- Speak with your healthcare provider and family.    If you have lab work done today you will be contacted with your lab results within the next 2 weeks.  If you have not heard from [place] then please contact us. The fastest way to get your results is to register for My Chart.   IF you received an x-ray today, you will receive an invoice from [facility] Radiology.  Please contact [facility] Radiology at [phone] with questions or concerns regarding your invoice.   IF you received labwork today, you will receive an invoice from [facility]. Please contact [facility] at [phone] with questions or concerns regarding your invoice.   Our billing staff will not be able to assist you with questions regarding bills from these companies.  You will be contacted with the lab results as soon as they are available. The fastest way to get your results is to activate your My Chart account. Instructions are located on the last page of this paperwork. If you have not heard from [place] regarding the results in 2 weeks, please contact this office.

## 2020-07-26 NOTE — Progress Notes (Signed)
Subjective:  Patient ID: Thomas Harding, male    DOB: 12/20/1955  Age: 64 y.o. MRN: [id number]  CC:  Chief Complaint  Patient presents with  . Annual Exam    CPE    HPI [name] presents for  Annual physical exam.   History of spinal stenosis, lumbar radiculopathy, orthopedist Dr. Tonita Cong with [facility]. appt this afternoon.   History of PVCs, bradycardia, noted postoperatively, cardiology eval with Dr. Virgina Jock September 9.  7-day telemetry from September 9 through September 16, 2 episodes of SVT, 4 episodes of V. Tach - 4-5 beats, dominant rhythm of sinus.  10% burden. And echocardiogram obtained 9/21 - EF was normal at 64%, normal global wall motion.  Normal systolic function, no evidence of pulmonary hypertension.  Plan for 1 year follow-up for asymptomatic PVCs. Has friend that is cardiologist that will be looking at readings as well.   Occasional feeling lightheaded/cloudy headed. No headaches/weakness.  Usually about 8-10am. Not during the day, not in evening. No sleep meds. No recent alcohol. Breakfast daily. No fluid restriction.  Nocturia - once per night. One cup coffee in am. Does drink water 1st past week.   No syncope. Sleeping decent. Has not met with sleep specialist yet. No known snoring, but prior witnessed pause prior.  No CP?dyspnea.  No associated palpitations.  Normal hgb on 10/8.    Cancer screening Colonoscopy 10/13/2018.  Prostate: Recent PSA in normal range.  Slightly higher than 2019. The natural history of prostate cancer and ongoing controversy regarding screening and potential treatment outcomes of prostate cancer has been discussed with the patient. The meaning of a false positive PSA and a false negative PSA has been discussed. He indicates understanding of the limitations of this screening test  DRE deferred.  Lab Results  Component Value Date   PSA1 1.7 07/21/2020   PSA1 1.1 06/24/2018  has dermatologist - planning on appt.    Immunization History  Administered Date(s) Administered  . Influenza,inj,Quad PF,6+ Mos 06/24/2018, 07/26/2020  . Moderna SARS-COVID-2 Vaccination 12/11/2019, 01/08/2020  . Tdap 03/15/2017  Covid vaccine 12/11/2019, 01/08/2020. Flu vaccine today.  Depression screen [facility] 2/9 07/26/2020 05/26/2020 06/24/2018 06/24/2018 06/08/2018  Decreased Interest 0 0 0 0 0  Down, Depressed, Hopeless 0 0 0 0 0  PHQ - 2 Score 0 0 0 0 0  Altered sleeping - - - - -  Tired, decreased energy - - - - -  Change in appetite - - - - -  Feeling bad or failure about yourself  - - - - -  Trouble concentrating - - - - -  Moving slowly or fidgety/restless - - - - -  Suicidal thoughts - - - - -  PHQ-9 Score - - - - -    Hearing Screening   '125Hz'  '250Hz'  '500Hz'  '1000Hz'  '2000Hz'  '3000Hz'  '4000Hz'  '6000Hz'  '8000Hz'   Right ear:           Left ear:             Visual Acuity Screening   Right eye Left eye Both eyes  Without correction:     With correction: 20/40 20/20 20/20-1  optho - Dr. Gershon Crane.   Dental: every other year.   Exercise: Walking. Will be discussing other with ortho.   Hyperlipidemia: Off lipitor in 2017 or 2018. Heavy feeling in legs in past, but had back surgery subsequently.   The 10-year ASCVD risk score Mikey Bussing DC Jr., et al., 2013) is: 12.9%   Values used  to calculate the score:     Age: 49 years     Sex: Male     Is Non-Hispanic African American: No     Diabetic: No     Tobacco smoker: No     Systolic Blood Pressure: 801 mmHg     Is BP treated: No     HDL Cholesterol: 36 mg/dL     Total Cholesterol: 203 mg/dL  Lab Results  Component Value Date   CHOL 203 (H) 07/21/2020   HDL 36 (L) 07/21/2020   LDLCALC 127 (H) 07/21/2020   TRIG 226 (H) 07/21/2020   CHOLHDL 5.6 (H) 07/21/2020   Lab Results  Component Value Date   ALT 23 07/21/2020   AST 21 07/21/2020   ALKPHOS 100 07/21/2020   BILITOT 0.7 07/21/2020     History Patient Active Problem List   Diagnosis Date Noted  . Bradycardia  06/22/2020  . PVC (premature ventricular contraction) 06/22/2020  . Spinal stenosis at L4-L5 level 05/18/2020  . Shortness of breath 11/22/2016   Past Medical History:  Diagnosis Date  . Hyperlipidemia    Past Surgical History:  Procedure Laterality Date  . LUMBAR LAMINECTOMY/DECOMPRESSION MICRODISCECTOMY Left 05/18/2020   Procedure: Microlumbar decompression Lumbar Four- Lumbar Five, Lumbar Five- Sacral One left;  Surgeon: Susa Day, MD;  Location: [facility];  Service: Orthopedics;  Laterality: Left;  Microlumbar decompression Lumbar Five- Sacral One left  . ROTATOR CUFF REPAIR Right   . thumb surgery Left   . TONSILLECTOMY    . VASECTOMY     Allergies  Allergen Reactions  . Bee Venom Anaphylaxis  . Penicillins Hives and Swelling   Prior to Admission medications   Medication Sig Start Date End Date Taking? Authorizing Provider  acetaminophen (TYLENOL) 325 MG tablet Take 650 mg by mouth every 6 (six) hours as needed.   Yes [provider]  ALPHA LIPOIC ACID PO Take 2 capsules by mouth daily.   Yes [provider]  Ascorbic Acid (VITAMIN C) 1000 MG tablet Take 1,000 mg by mouth daily.   Yes [provider]   Social History   Socioeconomic History  . Marital status: Married    Spouse name: Not on file  . Number of children: 2  . Years of education: Not on file  . Highest education level: Not on file  Occupational History  . Not on file  Tobacco Use  . Smoking status: Never Smoker  . Smokeless tobacco: Never Used  Vaping Use  . Vaping Use: Never used  Substance and Sexual Activity  . Alcohol use: Yes    Comment: occ  . Drug use: No  . Sexual activity: Yes  Other Topics Concern  . Not on file  Social History Narrative  . Not on file   Social Determinants of Health   Financial Resource Strain:   . Difficulty of Paying Living Expenses: Not on file  Food Insecurity:   . Worried About Charity fundraiser in the Last Year: Not on file  .  Ran Out of Food in the Last Year: Not on file  Transportation Needs:   . Lack of Transportation (Medical): Not on file  . Lack of Transportation (Non-Medical): Not on file  Physical Activity:   . Days of Exercise per Week: Not on file  . Minutes of Exercise per Session: Not on file  Stress:   . Feeling of Stress : Not on file  Social Connections:   . Frequency of Communication with  Friends and Family: Not on file  . Frequency of Social Gatherings with Friends and Family: Not on file  . Attends Religious Services: Not on file  . Active Member of Clubs or Organizations: Not on file  . Attends Archivist Meetings: Not on file  . Marital Status: Not on file  Intimate Partner Violence:   . Fear of Current or Ex-Partner: Not on file  . Emotionally Abused: Not on file  . Physically Abused: Not on file  . Sexually Abused: Not on file    Review of Systems 13 point review of systems per patient health survey noted.  Negative other than as indicated above or in HPI.    Objective:   Vitals:   07/26/20 0845 07/26/20 0856  BP: (!) 146/83 124/78  Pulse: 68   Temp: 98.4 F (36.9 C)   TempSrc: Temporal   SpO2: 98%   Weight: 230 lb (104.3 kg)   Height: '5\' 11"'  (1.803 m)      Physical Exam Vitals reviewed.  Constitutional:      Appearance: He is well-developed.  HENT:     Head: Normocephalic and atraumatic.     Right Ear: External ear normal.     Left Ear: External ear normal.  Eyes:     Conjunctiva/sclera: Conjunctivae normal.     Pupils: Pupils are equal, round, and reactive to light.  Neck:     Thyroid: No thyromegaly.  Cardiovascular:     Rate and Rhythm: Normal rate and regular rhythm.     Heart sounds: Normal heart sounds.  Pulmonary:     Effort: Pulmonary effort is normal. No respiratory distress.     Breath sounds: Normal breath sounds. No wheezing.  Abdominal:     General: There is no distension.     Palpations: Abdomen is soft.     Tenderness: There is  no abdominal tenderness.  Musculoskeletal:        General: No tenderness. Normal range of motion.     Cervical back: Normal range of motion and neck supple.  Lymphadenopathy:     Cervical: No cervical adenopathy.  Skin:    General: Skin is warm and dry.  Neurological:     General: No focal deficit present.     Mental Status: He is alert and oriented to person, place, and time.     Deep Tendon Reflexes: Reflexes are normal and symmetric.  Psychiatric:        Behavior: Behavior normal.      Assessment & Plan:  ANAS REISTER is a 64 y.o. male . Annual physical exam  - -anticipatory guidance as below in AVS, screening labs above. Health maintenance items as above in HPI discussed/recommended as applicable.   - PSA normal but with increase from 2019 - repeat in 6 months.   Need for immunization against influenza - Plan: Flu Vaccine QUAD 36+ mos IM  Episodic lightheadedness  - am only. Nonofocal. Prior cardiac workup reassuring. Sleep eval planned. Consider neuro eval if persistent, rtc/er precautions.   Mixed hyperlipidemia - Plan: pravastatin (PRAVACHOL) 20 MG tablet, Comprehensive metabolic panel, Lipid panel  - elevated 66yrascvd risk score.   -will try low dose pravachol. Lab only visit 6 weeks.   Meds ordered this encounter  Medications  . pravastatin (PRAVACHOL) 20 MG tablet    Sig: Take 1 tablet (20 mg total) by mouth daily.    Dispense:  90 tablet    Refill:  1   Patient Instructions  I do recommend following up with sleep specialist. Continue to drink plenty of fluids, and breakfast each day. If continued lightheaded/cloudy feeling it may be worth discussing with neurology. Let me know. Return to the clinic or go to the nearest emergency room if any of your symptoms worsen or new symptoms occur.  Repeat psa in 6 months, return sooner if new urinary symptoms.  Can start pravastatin for cholesterol few days per week, then increase to daily if tolerated.  6 week  lab only visit.   Keeping you healthy  Get these tests  Blood pressure- Have your blood pressure checked once a year by your healthcare provider.  Normal blood pressure is 120/80  Weight- Have your body mass index (BMI) calculated to screen for obesity.  BMI is a measure of body fat based on height and weight. You can also calculate your own BMI at ViewBanking.si.  Cholesterol- Have your cholesterol checked every year.  Diabetes- Have your blood sugar checked regularly if you have high blood pressure, high cholesterol, have a family history of diabetes or if you are overweight.  Screening for Colon Cancer- Colonoscopy starting at age 57.  Screening may begin sooner depending on your family history and other health conditions. Follow up colonoscopy as directed by your Gastroenterologist.  Screening for Prostate Cancer- Both blood work (PSA) and a rectal exam help screen for Prostate Cancer.  Screening begins at age 44 with African-American men and at age 59 with Caucasian men.  Screening may begin sooner depending on your family history.  Take these medicines  Aspirin- One aspirin daily can help prevent Heart disease and Stroke.  Flu shot- Every fall.  Tetanus- Every 10 years.  Zostavax- Once after the age of 78 to prevent Shingles.  Pneumonia shot- Once after the age of 72; if you are younger than 75, ask your healthcare provider if you need a Pneumonia shot.  Take these steps  Don't smoke- If you do smoke, talk to your doctor about quitting.  For tips on how to quit, go to www.smokefree.gov or call 1-800-QUIT-NOW.  Be physically active- Exercise 5 days a week for at least 30 minutes.  If you are not already physically active start slow and gradually work up to 30 minutes of moderate physical activity.  Examples of moderate activity include walking briskly, mowing the yard, dancing, swimming, bicycling, etc.  Eat a healthy diet- Eat a variety of healthy food such as  fruits, vegetables, low fat milk, low fat cheese, yogurt, lean meant, poultry, fish, beans, tofu, etc. For more information go to www.thenutritionsource.org  Drink alcohol in moderation- Limit alcohol intake to less than two drinks a day. Never drink and drive.  Dentist- Brush and floss twice daily; visit your dentist twice a year.  Depression- Your emotional health is as important as your physical health. If you're feeling down, or losing interest in things you would normally enjoy please talk to your healthcare provider.  Eye exam- Visit your eye doctor every year.  Safe sex- If you may be exposed to a sexually transmitted infection, use a condom.  Seat belts- Seat belts can save your life; always wear one.  Smoke/Carbon Monoxide detectors- These detectors need to be installed on the appropriate level of your home.  Replace batteries at least once a year.  Skin cancer- When out in the sun, cover up and use sunscreen 15 SPF or higher.  Violence- If anyone is threatening you, please tell your healthcare provider.  Living Will/ Health  care power of attorney- Speak with your healthcare provider and family.    If you have lab work done today you will be contacted with your lab results within the next 2 weeks.  If you have not heard from [place] then please contact us. The fastest way to get your results is to register for My Chart.   IF you received an x-ray today, you will receive an invoice from [facility]. Please contact [facility] at [phone] with questions or concerns regarding your invoice.   IF you received labwork today, you will receive an invoice from [facility]. Please contact [facility] at [phone] with questions or concerns regarding your invoice.   Our billing staff will not be able to assist you with questions regarding bills from these companies.  You will be contacted with the lab results as soon as they are available. The fastest way to get your  results is to activate your My Chart account. Instructions are located on the last page of this paperwork. If you have not heard from [place] regarding the results in 2 weeks, please contact this office.          Signed, Merri Ray, MD [facility]

## 2020-08-11 DIAGNOSIS — M5416 Radiculopathy, lumbar region: Secondary | ICD-10-CM | POA: Diagnosis not present

## 2020-08-21 ENCOUNTER — Ambulatory Visit: Payer: BLUE CROSS/BLUE SHIELD | Attending: Internal Medicine

## 2020-08-21 DIAGNOSIS — Z23 Encounter for immunization: Secondary | ICD-10-CM

## 2020-08-21 NOTE — Progress Notes (Addendum)
   Covid-19 Vaccination Clinic  Name:  Thomas Harding    MRN: [id number] DOB: [birth year]  08/21/2020  [name] was observed post Covid-19 immunization for 30 minutes without incident. He was provided with Vaccine Information Sheet and instruction to access thin V-Safe system.   [name] was instructed to call 911 with any severe reactions post vaccine: Marland Kitchen Difficulty breathing  . Swelling of face and throat  . A fast heartbeat  . A bad rash all over body  . Dizziness and weakness

## 2020-08-30 DIAGNOSIS — M5416 Radiculopathy, lumbar region: Secondary | ICD-10-CM | POA: Diagnosis not present

## 2020-09-06 DIAGNOSIS — M5416 Radiculopathy, lumbar region: Secondary | ICD-10-CM | POA: Diagnosis not present

## 2020-09-11 ENCOUNTER — Other Ambulatory Visit: Payer: Self-pay

## 2020-09-11 ENCOUNTER — Ambulatory Visit (INDEPENDENT_AMBULATORY_CARE_PROVIDER_SITE_OTHER): Payer: BLUE CROSS/BLUE SHIELD | Admitting: Family Medicine

## 2020-09-11 DIAGNOSIS — E782 Mixed hyperlipidemia: Secondary | ICD-10-CM

## 2020-09-12 ENCOUNTER — Encounter: Payer: Self-pay | Admitting: Family Medicine

## 2020-09-12 LAB — LIPID PANEL
Chol/HDL Ratio: 4.4 ratio (ref 0.0–5.0)
Cholesterol, Total: 162 mg/dL (ref 100–199)
HDL: 37 mg/dL — ABNORMAL LOW (ref >39)
LDL Chol Calc (NIH): 102 mg/dL — ABNORMAL HIGH (ref 0–99)
Triglycerides: 127 mg/dL (ref 0–149)
VLDL Cholesterol Cal: 23 mg/dL (ref 5–40)

## 2020-09-12 LAB — COMPREHENSIVE METABOLIC PANEL
ALT: 49 IU/L — ABNORMAL HIGH (ref 0–44)
AST: 39 IU/L (ref 0–40)
Albumin/Globulin Ratio: 1.6 (ref 1.2–2.2)
Albumin: 4.4 g/dL (ref 3.8–4.8)
Alkaline Phosphatase: 82 IU/L (ref 44–121)
BUN/Creatinine Ratio: 11 (ref 10–24)
BUN: 13 mg/dL (ref 8–27)
Bilirubin Total: 1.1 mg/dL (ref 0.0–1.2)
CO2: 25 mmol/L (ref 20–29)
Calcium: 9 mg/dL (ref 8.6–10.2)
Chloride: 102 mmol/L (ref 96–106)
Creatinine, Ser: 1.14 mg/dL (ref 0.76–1.27)
GFR calc Af Amer: 79 mL/min/{1.73_m2} (ref >59)
GFR calc non Af Amer: 68 mL/min/{1.73_m2} (ref >59)
Globulin, Total: 2.7 g/dL (ref 1.5–4.5)
Glucose: 98 mg/dL (ref 65–99)
Potassium: 4.6 mmol/L (ref 3.5–5.2)
Sodium: 140 mmol/L (ref 134–144)
Total Protein: 7.1 g/dL (ref 6.0–8.5)

## 2020-09-13 ENCOUNTER — Ambulatory Visit: Payer: BLUE CROSS/BLUE SHIELD

## 2020-09-13 DIAGNOSIS — M5416 Radiculopathy, lumbar region: Secondary | ICD-10-CM | POA: Diagnosis not present

## 2020-09-18 ENCOUNTER — Telehealth: Payer: Self-pay | Admitting: Family Medicine

## 2020-09-18 NOTE — Telephone Encounter (Signed)
Reply from encounter on 11/30 pt is wanting labs done before he makes an office visit. Spoke with nurse and stated he will speak with provider about this and go from there. Please advise.

## 2020-09-21 ENCOUNTER — Other Ambulatory Visit: Payer: Self-pay | Admitting: Emergency Medicine

## 2020-09-21 DIAGNOSIS — R748 Abnormal levels of other serum enzymes: Secondary | ICD-10-CM

## 2020-09-22 DIAGNOSIS — M5416 Radiculopathy, lumbar region: Secondary | ICD-10-CM | POA: Diagnosis not present

## 2020-09-22 NOTE — Telephone Encounter (Signed)
Pt to have lab done end of December to reevaluate then we will schedule OV from there please and thank you order is in

## 2020-09-22 NOTE — Telephone Encounter (Signed)
Sch labs for 10/11/20

## 2020-09-27 DIAGNOSIS — M5416 Radiculopathy, lumbar region: Secondary | ICD-10-CM | POA: Diagnosis not present

## 2020-10-11 ENCOUNTER — Ambulatory Visit (INDEPENDENT_AMBULATORY_CARE_PROVIDER_SITE_OTHER): Payer: BLUE CROSS/BLUE SHIELD | Admitting: Emergency Medicine

## 2020-10-11 ENCOUNTER — Other Ambulatory Visit: Payer: Self-pay

## 2020-10-11 DIAGNOSIS — R748 Abnormal levels of other serum enzymes: Secondary | ICD-10-CM

## 2020-10-11 LAB — COMPREHENSIVE METABOLIC PANEL
ALT: 39 IU/L (ref 0–44)
AST: 30 IU/L (ref 0–40)
Albumin/Globulin Ratio: 1.8 (ref 1.2–2.2)
Albumin: 4.1 g/dL (ref 3.8–4.8)
Alkaline Phosphatase: 88 IU/L (ref 44–121)
BUN/Creatinine Ratio: 14 (ref 10–24)
BUN: 17 mg/dL (ref 8–27)
Bilirubin Total: 1.2 mg/dL (ref 0.0–1.2)
CO2: 25 mmol/L (ref 20–29)
Calcium: 9.2 mg/dL (ref 8.6–10.2)
Chloride: 101 mmol/L (ref 96–106)
Creatinine, Ser: 1.23 mg/dL (ref 0.76–1.27)
GFR calc Af Amer: 72 mL/min/{1.73_m2} (ref >59)
GFR calc non Af Amer: 62 mL/min/{1.73_m2} (ref >59)
Globulin, Total: 2.3 g/dL (ref 1.5–4.5)
Glucose: 105 mg/dL — ABNORMAL HIGH (ref 65–99)
Potassium: 4.5 mmol/L (ref 3.5–5.2)
Sodium: 140 mmol/L (ref 134–144)
Total Protein: 6.4 g/dL (ref 6.0–8.5)

## 2020-11-15 NOTE — Telephone Encounter (Signed)
erroe

## 2020-12-07 DIAGNOSIS — H2513 Age-related nuclear cataract, bilateral: Secondary | ICD-10-CM | POA: Diagnosis not present

## 2021-01-24 ENCOUNTER — Ambulatory Visit: Payer: Self-pay | Admitting: Family Medicine

## 2021-01-24 DIAGNOSIS — M5416 Radiculopathy, lumbar region: Secondary | ICD-10-CM | POA: Diagnosis not present

## 2021-01-26 ENCOUNTER — Other Ambulatory Visit: Payer: Self-pay | Admitting: Family Medicine

## 2021-01-26 DIAGNOSIS — E782 Mixed hyperlipidemia: Secondary | ICD-10-CM

## 2021-01-26 NOTE — Telephone Encounter (Signed)
   Notes to clinic Not a provider we approve rx for.  

## 2021-02-12 ENCOUNTER — Ambulatory Visit (INDEPENDENT_AMBULATORY_CARE_PROVIDER_SITE_OTHER): Payer: BLUE CROSS/BLUE SHIELD | Admitting: Family Medicine

## 2021-02-12 ENCOUNTER — Other Ambulatory Visit: Payer: Self-pay

## 2021-02-12 ENCOUNTER — Encounter: Payer: Self-pay | Admitting: Family Medicine

## 2021-02-12 VITALS — BP 124/76 | HR 62 | Temp 98.4°F | Resp 16 | Ht 71.0 in | Wt 230.0 lb

## 2021-02-12 DIAGNOSIS — R400 Somnolence: Secondary | ICD-10-CM

## 2021-02-12 DIAGNOSIS — Z125 Encounter for screening for malignant neoplasm of prostate: Secondary | ICD-10-CM

## 2021-02-12 DIAGNOSIS — R0683 Snoring: Secondary | ICD-10-CM

## 2021-02-12 DIAGNOSIS — E669 Obesity, unspecified: Secondary | ICD-10-CM

## 2021-02-12 DIAGNOSIS — Z6832 Body mass index (BMI) 32.0-32.9, adult: Secondary | ICD-10-CM

## 2021-02-12 DIAGNOSIS — E782 Mixed hyperlipidemia: Secondary | ICD-10-CM | POA: Diagnosis not present

## 2021-02-12 DIAGNOSIS — I493 Ventricular premature depolarization: Secondary | ICD-10-CM

## 2021-02-12 NOTE — Progress Notes (Signed)
Subjective:  Patient ID: Thomas Harding, male    DOB: 06/25/1956  Age: 65 y.o. MRN: [id number]  CC:  Chief Complaint  Patient presents with  . Hyperlipidemia    Pt doing well trying to maintain weight and diet.     HPI Thomas Harding presents for   Hyperlipidemia: Pravastatin 20 mg daily. No new myalgias/side effects like on prior meds. Current dose tolerated. Has taken Ally to help with weight loss.  Glucose 105 in December.  Prior PVC's evaluated by cardiology - overall similar. Told was 8%, and would not recommend betablocker/treatment until  15% or more symptoms. No new DOE. No CP.  Prior elevated lft's, normalized most recently.  Not fasting at current time.  Wt Readings from Last 3 Encounters:  02/12/21 230 lb (104.3 kg)  07/26/20 230 lb (104.3 kg)  06/22/20 226 lb (102.5 kg)    Lab Results  Component Value Date   CHOL 162 09/11/2020   HDL 37 (L) 09/11/2020   LDLCALC 102 (H) 09/11/2020   TRIG 127 09/11/2020   CHOLHDL 4.4 09/11/2020   Lab Results  Component Value Date   ALT 39 10/11/2020   AST 30 10/11/2020   ALKPHOS 88 10/11/2020   BILITOT 1.2 10/11/2020   Foot parasthesias: Left foot - all toes and into arch, lower leg. Stable for years. Few toes in R foot.  Had low back surgery last August - for sciatica. Dr. Tonita Cong. 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 Has discussed with Dr. Tonita Cong - could still be related to lumbar issues.  Not bothering him. Does not want to take gabapentin.   PSA in 07/2020 wnl, but higher than prior reading. 6 month repeat planned.  Also discussed sleep specialist eval in past- referred in 05/2020 with snoring, PVC's, bradycardia.  Has not met with sleep specialist - scheduling. Unsure if still snoring - noted by friend on golf trip  - possible apnea. Tired during the day, no naps during the week, but on weekends. Will fall asleep watching tv. Would like to meet with sleep specialist now.   Lab Results  Component Value Date   PSA1 1.7  07/21/2020   PSA1 1.1 06/24/2018   The natural history of prostate cancer and ongoing controversy regarding screening and potential treatment outcomes of prostate cancer has been discussed with the patient. The meaning of a false positive PSA and a false negative PSA has been discussed. He indicates understanding of the limitations of this screening test and wishes  to proceed with screening PSA testing.   History Patient Active Problem List   Diagnosis Date Noted  . Bradycardia 06/22/2020  . PVC (premature ventricular contraction) 06/22/2020  . Spinal stenosis at L4-L5 level 05/18/2020  . Shortness of breath 11/22/2016   Past Medical History:  Diagnosis Date  . Hyperlipidemia    Past Surgical History:  Procedure Laterality Date  . LUMBAR LAMINECTOMY/DECOMPRESSION MICRODISCECTOMY Left 05/18/2020   Procedure: Microlumbar decompression Lumbar Four- Lumbar Five, Lumbar Five- Sacral One left;  Surgeon: Susa Day, MD;  Location: [facility];  Service: Orthopedics;  Laterality: Left;  Microlumbar decompression Lumbar Five- Sacral One left  . ROTATOR CUFF REPAIR Right   . thumb surgery Left   . TONSILLECTOMY    . VASECTOMY     Allergies  Allergen Reactions  . Bee Venom Anaphylaxis  . Penicillins Hives and Swelling   Prior to Admission medications   Medication Sig Start Date End Date Taking? Authorizing Provider  acetaminophen (TYLENOL) 325 MG tablet Take  650 mg by mouth every 6 (six) hours as needed.   Yes [provider]  ALPHA LIPOIC ACID PO Take 2 capsules by mouth daily.   Yes [provider]  Ascorbic Acid (VITAMIN C) 1000 MG tablet Take 1,000 mg by mouth daily.   Yes [provider]  pravastatin (PRAVACHOL) 20 MG tablet TAKE ONE TABLET BY MOUTH DAILY 02/05/21  Yes [name], MD   Social History   Socioeconomic History  . Marital status: Married    Spouse name: Not on file  . Number of children: 2  . Years of education: Not on file  . Highest  education level: Not on file  Occupational History  . Not on file  Tobacco Use  . Smoking status: Never Smoker  . Smokeless tobacco: Never Used  Vaping Use  . Vaping Use: Never used  Substance and Sexual Activity  . Alcohol use: Yes    Comment: occ  . Drug use: No  . Sexual activity: Yes  Other Topics Concern  . Not on file  Social History Narrative  . Not on file   Social Determinants of Health   Financial Resource Strain: Not on file  Food Insecurity: Not on file  Transportation Needs: Not on file  Physical Activity: Not on file  Stress: Not on file  Social Connections: Not on file  Intimate Partner Violence: Not on file    Review of Systems  Constitutional: Negative for fatigue and unexpected weight change.  Eyes: Negative for visual disturbance.  Cardiovascular: Positive for palpitations (episodic. ). Negative for chest pain and leg swelling.  Gastrointestinal: Negative for abdominal pain and blood in stool.  Neurological: Negative for dizziness, light-headedness and headaches.   Per HPI.    Objective:   Vitals:   02/12/21 1548  BP: 124/76  Pulse: 62  Resp: 16  Temp: 98.4 F (36.9 C)  TempSrc: Temporal  SpO2: 95%  Weight: 230 lb (104.3 kg)  Height: _0  (1.803 m)     Physical Exam Vitals reviewed.  Constitutional:      Appearance: He is well-developed.  HENT:     Head: Normocephalic and atraumatic.  Eyes:     Pupils: Pupils are equal, round, and reactive to light.  Neck:     Vascular: No carotid bruit or JVD.  Cardiovascular:     Rate and Rhythm: Normal rate and regular rhythm.     Heart sounds: Normal heart sounds. No murmur heard.   Pulmonary:     Effort: Pulmonary effort is normal.     Breath sounds: Normal breath sounds. No rales.  Musculoskeletal:     Right lower leg: No edema.     Left lower leg: No edema.  Skin:    General: Skin is warm and dry.  Neurological:     Mental Status: He is alert and oriented to person, place, and  time.     Assessment & Plan:  Thomas Harding is a 65 y.o. male . Mixed hyperlipidemia - Plan: Comprehensive metabolic panel, Lipid panel  -Tolerating current dose of pravastatin, recently refilled.  Check labs with future lab visit for fasting labs  Screening for malignant neoplasm of prostate - Plan: PSA  -Check PSA to evaluate velocity with slight increase on last reading compared to prior.  Consider urology eval if increasing.  PVC (premature ventricular contraction)  -Episodic symptoms, no recent changes.  Consider cardiology follow-up to decide on meds if increased symptomatology.  Sleep apnea testing recommended as  below as well.  Snoring Daytime somnolence Class 1 obesity with body mass index (BMI) of 32.0 to 32.9 in adult, unspecified obesity type, unspecified whether serious comorbidity present  -Possible apnea with snoring witnessed by friend.  Does have some daytime somnolence, obesity as risk factors for OSA.  Previously referred to sleep specialist, phone number provided but can place new referral if needed.  -Phone number provided for healthy weight and wellness as he would like to look into options for weight loss.  No orders of the defined types were placed in this encounter.  Patient Instructions   Medical Weight Loss Management - Healthy Weight and Wellness . [phone]  Let me know if you want to meet with neuro to discuss other testing for foot symptoms.   No change in cholesterol med at this time.   I will recheck your PSA.   I do recommend meeting with sleep specialist - let me know if they need a new referral.  [facility] Address: [address], [place], [place] [address] Phone: [phone]       Signed, Merri Ray, MD [facility]

## 2021-02-12 NOTE — Patient Instructions (Addendum)
  Medical Weight Loss Management - Healthy Weight and Wellness . [phone]  Let me know if you want to meet with neuro to discuss other testing for foot symptoms.   No change in cholesterol med at this time.   I will recheck your PSA.   I do recommend meeting with sleep specialist - let me know if they need a new referral.  [facility] at [facility] Address: [address] [place], [place] [address] Phone: [phone]

## 2021-02-15 ENCOUNTER — Other Ambulatory Visit: Payer: Self-pay

## 2021-02-15 ENCOUNTER — Other Ambulatory Visit (INDEPENDENT_AMBULATORY_CARE_PROVIDER_SITE_OTHER): Payer: BLUE CROSS/BLUE SHIELD

## 2021-02-15 DIAGNOSIS — E782 Mixed hyperlipidemia: Secondary | ICD-10-CM

## 2021-02-15 DIAGNOSIS — Z125 Encounter for screening for malignant neoplasm of prostate: Secondary | ICD-10-CM | POA: Diagnosis not present

## 2021-02-15 LAB — COMPREHENSIVE METABOLIC PANEL
ALT: 26 U/L (ref 0–53)
AST: 22 U/L (ref 0–37)
Albumin: 4.1 g/dL (ref 3.5–5.2)
Alkaline Phosphatase: 74 U/L (ref 39–117)
BUN: 20 mg/dL (ref 6–23)
CO2: 30 mEq/L (ref 19–32)
Calcium: 9 mg/dL (ref 8.4–10.5)
Chloride: 101 mEq/L (ref 96–112)
Creatinine, Ser: 1.23 mg/dL (ref 0.40–1.50)
GFR: 62.14 mL/min (ref >60.00)
Glucose, Bld: 111 mg/dL — ABNORMAL HIGH (ref 70–99)
Potassium: 4.2 mEq/L (ref 3.5–5.1)
Sodium: 140 mEq/L (ref 135–145)
Total Bilirubin: 1 mg/dL (ref 0.2–1.2)
Total Protein: 6.9 g/dL (ref 6.0–8.3)

## 2021-02-15 LAB — LIPID PANEL
Cholesterol: 179 mg/dL (ref 0–200)
HDL: 39.5 mg/dL (ref >39.00)
LDL Cholesterol: 102 mg/dL — ABNORMAL HIGH (ref 0–99)
NonHDL: 139.49
Total CHOL/HDL Ratio: 5
Triglycerides: 188 mg/dL — ABNORMAL HIGH (ref 0.0–149.0)
VLDL: 37.6 mg/dL (ref 0.0–40.0)

## 2021-02-15 LAB — PSA: PSA: 1.09 ng/mL (ref 0.10–4.00)

## 2021-03-19 ENCOUNTER — Encounter: Payer: Self-pay | Admitting: Dermatology

## 2021-03-19 ENCOUNTER — Other Ambulatory Visit: Payer: Self-pay

## 2021-03-19 ENCOUNTER — Ambulatory Visit: Payer: BLUE CROSS/BLUE SHIELD | Admitting: Dermatology

## 2021-03-19 DIAGNOSIS — D1801 Hemangioma of skin and subcutaneous tissue: Secondary | ICD-10-CM | POA: Diagnosis not present

## 2021-03-19 DIAGNOSIS — Z1283 Encounter for screening for malignant neoplasm of skin: Secondary | ICD-10-CM | POA: Diagnosis not present

## 2021-03-19 DIAGNOSIS — L821 Other seborrheic keratosis: Secondary | ICD-10-CM | POA: Diagnosis not present

## 2021-03-19 DIAGNOSIS — L720 Epidermal cyst: Secondary | ICD-10-CM

## 2021-03-19 DIAGNOSIS — L570 Actinic keratosis: Secondary | ICD-10-CM | POA: Diagnosis not present

## 2021-03-19 DIAGNOSIS — L918 Other hypertrophic disorders of the skin: Secondary | ICD-10-CM

## 2021-03-22 ENCOUNTER — Encounter: Payer: Self-pay | Admitting: Dermatology

## 2021-03-22 NOTE — Progress Notes (Signed)
   Follow-Up Visit   Subjective  Thomas Harding is a 65 y.o. male who presents for the following: Annual Exam (Scalp- few scaly spots ).  Annual examination, several crusty spots Location:  Duration:  Quality:  Associated Signs/Symptoms: Modifying Factors:  Severity:  Timing: Context:   Objective  Well appearing patient in no apparent distress; mood and affect are within normal limits. Chest - Medial (Center) 19mm smooth red dermal papule  Mid Back Noninflamed 1 cm deep dermal nodule  Left Upper Back Brown textured flattopped 5 mm papule neck: Dermoscopy typical  Right Axilla Pedunculated flesh-colored 1 to 3 mm papules  Mid Parietal Scalp (5), Right Malar Cheek Multiple 3 to 5 mm gritty and hornlike pink crusts    A full examination was performed including scalp, head, eyes, ears, nose, lips, neck, chest, axillae, abdomen, back, buttocks, bilateral upper extremities, bilateral lower extremities, hands, feet, fingers, toes, fingernails, and toenails. All findings within normal limits unless otherwise noted below.   Assessment & Plan    Hemangioma of skin Chest - Medial [facility])  Intervention not necessary  Epidermal cyst Mid Back  Patient can decide if he wishes future removal.  Seborrheic keratosis Left Upper Back  Leave if stable  Skin tag Right Axilla  Patient may choose future removal.  AK (actinic keratosis) (6) Mid Parietal Scalp (5); Right Malar Cheek  Destruction of lesion - Mid Parietal Scalp, Right Malar Cheek Complexity: simple   Destruction method: cryotherapy   Informed consent: discussed and consent obtained   Timeout:  patient name, date of birth, surgical site, and procedure verified Lesion destroyed using liquid nitrogen: Yes   Cryotherapy cycles:  5 Outcome: patient tolerated procedure well with no complications   Post-procedure details: wound care instructions given        I, Lavonna Monarch, MD, have reviewed all  documentation for this visit.  The documentation on 03/22/21 for the exam, diagnosis, procedures, and orders are all accurate and complete.

## 2021-06-07 ENCOUNTER — Encounter: Payer: Self-pay | Admitting: Cardiology

## 2021-06-07 ENCOUNTER — Telehealth: Payer: Self-pay

## 2021-06-07 ENCOUNTER — Other Ambulatory Visit: Payer: Self-pay

## 2021-06-07 ENCOUNTER — Ambulatory Visit: Payer: BLUE CROSS/BLUE SHIELD | Admitting: Cardiology

## 2021-06-07 VITALS — BP 150/58 | HR 43 | Temp 98.0°F | Resp 16 | Ht 71.0 in | Wt 230.0 lb

## 2021-06-07 DIAGNOSIS — R072 Precordial pain: Secondary | ICD-10-CM | POA: Diagnosis not present

## 2021-06-07 DIAGNOSIS — R001 Bradycardia, unspecified: Secondary | ICD-10-CM

## 2021-06-07 DIAGNOSIS — I493 Ventricular premature depolarization: Secondary | ICD-10-CM

## 2021-06-07 MED ORDER — METOPROLOL TARTRATE 25 MG PO TABS: 25.0000 mg | ORAL_TABLET | Freq: Two times a day (BID) | ORAL | 3 refills | Status: DC

## 2021-06-07 NOTE — Progress Notes (Signed)
Patient referred by [name], MD for bradycardia  Subjective:   Thomas Harding, male    DOB: Feb 01, 1956, 65 y.o.   MRN: [id number]   Chief Complaint  Patient presents with   Bradycardia   Shortness of Breath   Fatigue   Chest Pain   Hypertension   Follow-up     HPI  65 y.o. Caucasian male with PVC  Patient has recently experienced more shortness of breath and occasional chest tightness-unrelated to exertion. Blood pressure is elevated today.  Initial consultation HPI 06/2020: Patient recently underwent back surgery.  Preoperatively, he was noticed to have frequent PVCs and bradycardia.  He was therefore referred for further evaluation.  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  He denies any exertional chest pain, overt dyspnea, presyncope, syncope.  He previously underwent echocardiogram exercise stress test 2017, both were unremarkable.   Current Outpatient Medications on File Prior to Visit  Medication Sig Dispense Refill   acetaminophen (TYLENOL) 325 MG tablet Take 650 mg by mouth every 6 (six) hours as needed.     ALPHA LIPOIC ACID PO Take 2 capsules by mouth daily.     Ascorbic Acid (VITAMIN C) 1000 MG tablet Take 1,000 mg by mouth daily.     magnesium oxide (MAG-OX) 400 MG tablet Take 400 mg by mouth daily.     Multiple Vitamin (MULTIVITAMIN) tablet Take 1 tablet by mouth daily.     pravastatin (PRAVACHOL) 20 MG tablet TAKE ONE TABLET BY MOUTH DAILY 90 tablet 1   No current facility-administered medications on file prior to visit.    Cardiovascular and other pertinent studies:  EKG 06/07/2021: Sinus rhythm 93 bpm Frequent pvcs -ventricular bigeminy and trigeminy  Echocardiogram 07/04/2020:  Left ventricle cavity is normal in size and wall thickness. Normal global  wall motion. Normal LV systolic function with EF 64%. Normal diastolic  filling pattern.  Left atrial cavity is normal in size.  Mild tricuspid  regurgitation.  No evidence of pulmonary hypertension.  Long term monitor 7 days 06/22/2020 - 06/29/2020: Dominant rhythm: Sinus. HR 54-214 bpm. Avg HR 80 bpm. 4 episodes of VT, fastest at 214 bpm for 4 beats, longest for 5 beats at 141 bpm. Isolated VE 10%. Rare couplet, triplet 2 episodes of SVT, fastest at 143 bpm for 6 beats, longest for 9 beats at 135 bpm. Rare SVE No atrial fibrillation/atrial flutter/high grade AV block, sinus pause >3sec noted. 3 patient triggered events, 2 associated with VE.    Exercise treadmill stress test 2017: 10.1 METS. No ischemia   Recent labs: 05/19/2020: Glucose 187, BUN/Cr 20/1.34. EGFR 56. Na/K 137/4.6. Albumin 3.3. Rest of the CMP normal H/H 13/43. MCV 89. Platelets 206   Review of Systems  Cardiovascular:  Positive for chest pain, dyspnea on exertion and palpitations. Negative for leg swelling and syncope.        Vitals:   06/07/21 1419  BP: (!) 150/58  Pulse: (!) 43  Resp: 16  Temp: 98 F (36.7 C)  SpO2: 99%     Body mass index is 32.08 kg/m. Filed Weights   06/07/21 1419  Weight: 230 lb (104.3 kg)     Objective:   Physical Exam Vitals and nursing note reviewed.  Constitutional:      General: He is not in acute distress. Neck:     Vascular: No JVD.  Cardiovascular:     Rate and Rhythm: Normal rate and regular rhythm. FrequentExtrasystoles are present.  Heart sounds: Normal heart sounds. No murmur heard. Pulmonary:     Effort: Pulmonary effort is normal.     Breath sounds: Normal breath sounds. No wheezing or rales.  Musculoskeletal:     Right lower leg: No edema.     Left lower leg: No edema.         Assessment & Recommendations:   65 y.o. Caucasian male with PVC  Symptomatic PVC's: Ventricular bigeminy and trigeminy likely causing patients. Start metoprolol tartrate 25 mg bid. Will check echocardiogram, exercise treadmill stress test and 1 week cardiac telemetry.    Nigel Mormon, MD Pager:  [phone] Office: [phone]

## 2021-06-07 NOTE — Telephone Encounter (Signed)
I can see him this afternoon.  Thanks MJP

## 2021-06-14 ENCOUNTER — Telehealth: Payer: Self-pay | Admitting: Cardiology

## 2021-06-14 NOTE — Telephone Encounter (Signed)
Pt called wanting to speak w/medical assistant about his metoprolol; says there has been no improvement since he was put on this medication last week, heart rate is still in mid 40s to mid 52s.

## 2021-06-14 NOTE — Telephone Encounter (Signed)
Slow heart rate is likely due to the PVC's. Can increase to to 2 pills of 25 mg bid. Workup with echocardiogram, stress test and monitor is pending.  Thanks MJP

## 2021-06-14 NOTE — Telephone Encounter (Signed)
Pt called wanting to speak w/medical assistant about his metoprolol; says there has been no improvement since he was put on this medication last week, heart rate is still in mid 40s to mid 2s.

## 2021-06-15 ENCOUNTER — Ambulatory Visit: Payer: BLUE CROSS/BLUE SHIELD | Admitting: Cardiology

## 2021-06-15 NOTE — Telephone Encounter (Signed)
Attempted to call pt, no answer. Unable to leave Vm requesting call back.

## 2021-06-20 NOTE — Telephone Encounter (Signed)
Called pt, pts bp and hr listed below. Pt stated he has not changed anything with his metoprolol and that he is feeling much better now. Pts BP and HR are listed below.  Sept 4th- 112/68 Sept 5th- 125/72 Sept 6th- 128/75  HR: Aug 31st- 50 Sept 1- 59 Sept 2- 61 Sept 3rd- 62 Sept 4th- 58 Sept 5th- 61 Sept 6th- 59

## 2021-06-20 NOTE — Telephone Encounter (Signed)
Great to hear.  Thanks MJP

## 2021-06-26 ENCOUNTER — Inpatient Hospital Stay: Payer: BLUE CROSS/BLUE SHIELD

## 2021-06-26 ENCOUNTER — Other Ambulatory Visit: Payer: Self-pay

## 2021-06-26 ENCOUNTER — Ambulatory Visit: Payer: BLUE CROSS/BLUE SHIELD

## 2021-06-26 DIAGNOSIS — R072 Precordial pain: Secondary | ICD-10-CM

## 2021-06-26 DIAGNOSIS — I493 Ventricular premature depolarization: Secondary | ICD-10-CM

## 2021-06-27 ENCOUNTER — Ambulatory Visit: Payer: BLUE CROSS/BLUE SHIELD | Admitting: Cardiology

## 2021-07-10 DIAGNOSIS — R072 Precordial pain: Secondary | ICD-10-CM | POA: Diagnosis not present

## 2021-07-10 DIAGNOSIS — I493 Ventricular premature depolarization: Secondary | ICD-10-CM | POA: Diagnosis not present

## 2021-08-02 ENCOUNTER — Ambulatory Visit: Payer: BLUE CROSS/BLUE SHIELD | Admitting: Cardiology

## 2021-08-02 ENCOUNTER — Encounter: Payer: Self-pay | Admitting: Cardiology

## 2021-08-02 ENCOUNTER — Other Ambulatory Visit: Payer: Self-pay

## 2021-08-02 ENCOUNTER — Ambulatory Visit: Payer: BLUE CROSS/BLUE SHIELD

## 2021-08-02 VITALS — BP 124/73 | HR 68 | Temp 98.0°F | Resp 17 | Ht 71.0 in | Wt 229.0 lb

## 2021-08-02 DIAGNOSIS — I493 Ventricular premature depolarization: Secondary | ICD-10-CM

## 2021-08-02 DIAGNOSIS — R072 Precordial pain: Secondary | ICD-10-CM | POA: Diagnosis not present

## 2021-08-02 DIAGNOSIS — I471 Supraventricular tachycardia: Secondary | ICD-10-CM | POA: Diagnosis not present

## 2021-08-02 LAB — PCV CARDIAC STRESS TEST
Angina Index: 0
ST Depression (mm): 0 mm

## 2021-08-02 MED ORDER — METOPROLOL TARTRATE 25 MG PO TABS: 25.0000 mg | ORAL_TABLET | Freq: Two times a day (BID) | ORAL | 3 refills | Status: DC

## 2021-08-02 NOTE — Progress Notes (Signed)
Patient referred by [name], MD for bradycardia  Subjective:   Thomas Harding, male    DOB: 16-Mar-1956, 65 y.o.   MRN: [id number]   Chief Complaint  Patient presents with   Bradycardia   Follow-up   Results    monitor     HPI  65 y.o. Caucasian male with PVC  Symptoms significantly improved with metoprolol.  Echocardiogram, monitor, stress test results discussed with the patient, details below.  On specific questioning, patient reports that he has been told that he "quits breathing" during sleep at times.  Initial consultation HPI 06/2020: Patient recently underwent back surgery.  Preoperatively, he was noticed to have frequent PVCs and bradycardia.  He was therefore referred for further evaluation.  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  He denies any exertional chest pain, overt dyspnea, presyncope, syncope.  He previously underwent echocardiogram exercise stress test 2017, both were unremarkable.   Current Outpatient Medications on File Prior to Visit  Medication Sig Dispense Refill   acetaminophen (TYLENOL) 325 MG tablet Take 650 mg by mouth every 6 (six) hours as needed.     ALPHA LIPOIC ACID PO Take 2 capsules by mouth daily.     metoprolol tartrate (LOPRESSOR) 25 MG tablet Take 1 tablet (25 mg total) by mouth 2 (two) times daily. 180 tablet 3   Multiple Vitamin (MULTIVITAMIN) tablet Take 1 tablet by mouth daily.     pravastatin (PRAVACHOL) 20 MG tablet TAKE ONE TABLET BY MOUTH DAILY 90 tablet 1   No current facility-administered medications on file prior to visit.    Cardiovascular and other pertinent studies:  Exercise treadmill stress test 08/02/2021: Exercise treadmill stress test performed using Bruce protocol.  Patient reached 7.5 METS, and 88% of age predicted maximum heart rate.  Exercise capacity was fair.  No chest pain reported, dyspnea reported.  Normal heart rate and hemodynamic response. Stress EKG  revealed no ischemic changes, showed frequent PVCs. Low risk study.  Mobile cardiac telemetry 7 days 06/26/2021 - 07/03/2021: Dominant rhythm: Sinus. HR 48-134 bpm. Avg HR 69 bpm. 2 episodes of probable atrial tachycardia, fastest at 116 bpm for 6 beats, longest for 31.6 secs at 91 bpm. <1% isolated SVE, couplet/triplets. 0 episodes of VT 1.2% isolated VE, <1 couplet/triplets. No atrial fibrillation/atrial flutter/VT/high grade AV block, sinus pause >3sec noted. 0 patient triggered events.   Echocardiogram 06/26/2021:  Left ventricle cavity is normal in size and wall thickness. Normal global  wall motion. Normal LV systolic function with EF 60%. Normal diastolic  filling pattern.  No significant valvular abnormality.  No evidence of pulmonary hypertension.  No significant change compared to previous study in 2017.  EKG 06/07/2021: Sinus rhythm 93 bpm Frequent pvcs -ventricular bigeminy and trigeminy  Echocardiogram 07/04/2020:  Left ventricle cavity is normal in size and wall thickness. Normal global  wall motion. Normal LV systolic function with EF 64%. Normal diastolic  filling pattern.  Left atrial cavity is normal in size.  Mild tricuspid regurgitation.  No evidence of pulmonary hypertension.  Long term monitor 7 days 06/22/2020 - 06/29/2020: Dominant rhythm: Sinus. HR 54-214 bpm. Avg HR 80 bpm. 4 episodes of VT, fastest at 214 bpm for 4 beats, longest for 5 beats at 141 bpm. Isolated VE 10%. Rare couplet, triplet 2 episodes of SVT, fastest at 143 bpm for 6 beats, longest for 9 beats at 135 bpm. Rare SVE No atrial fibrillation/atrial flutter/high grade AV block, sinus pause >3sec  noted. 3 patient triggered events, 2 associated with VE.    Exercise treadmill stress test 2017: 10.1 METS. No ischemia   Recent labs: 05/19/2020: Glucose 187, BUN/Cr 20/1.34. EGFR 56. Na/K 137/4.6. Albumin 3.3. Rest of the CMP normal H/H 13/43. MCV 89. Platelets 206   Review of Systems   Cardiovascular:  Positive for chest pain, dyspnea on exertion and palpitations. Negative for leg swelling and syncope.        Vitals:   08/02/21 1311  BP: 124/73  Pulse: 68  Resp: 17  Temp: 98 F (36.7 C)  SpO2: 99%     Body mass index is 31.94 kg/m. Filed Weights   08/02/21 1311  Weight: 229 lb (103.9 kg)     Objective:   Physical Exam Vitals and nursing note reviewed.  Constitutional:      General: He is not in acute distress. Neck:     Vascular: No JVD.  Cardiovascular:     Rate and Rhythm: Normal rate and regular rhythm. FrequentExtrasystoles are present.    Heart sounds: Normal heart sounds. No murmur heard. Pulmonary:     Effort: Pulmonary effort is normal.     Breath sounds: Normal breath sounds. No wheezing or rales.  Musculoskeletal:     Right lower leg: No edema.     Left lower leg: No edema.         Assessment & Recommendations:   65 y.o. Caucasian male with PVC  Symptomatic PVC's, paroxysmal atrial tachycardia: PVCs improved with metoprolol tartrate 25 mg twice daily.  PVC burden at 1.2%.  No PVCs at rest.  PVCs seen with exercise on exercise treadmill stress test, without any other ischemic changes. Atrial tachycardia is occasional.  Continue metoprolol tartrate as above. I suspect untreated and undiagnosed obstructive sleep apnea may be an underlying risk factor.  He has an appointment with his PCP Dr. Nyoka Cowden in 2 weeks.  He will discuss sleep study and further management with him.  F/u in 6 months   Thomas Mormon, MD Pager: [phone] Office: [phone]

## 2021-08-09 ENCOUNTER — Other Ambulatory Visit: Payer: Self-pay | Admitting: Family Medicine

## 2021-08-09 DIAGNOSIS — E782 Mixed hyperlipidemia: Secondary | ICD-10-CM

## 2021-08-20 ENCOUNTER — Ambulatory Visit (INDEPENDENT_AMBULATORY_CARE_PROVIDER_SITE_OTHER): Payer: BLUE CROSS/BLUE SHIELD | Admitting: Family Medicine

## 2021-08-20 VITALS — BP 128/78 | HR 49 | Temp 97.8°F | Resp 15 | Ht 71.0 in | Wt 228.6 lb

## 2021-08-20 DIAGNOSIS — R0789 Other chest pain: Secondary | ICD-10-CM | POA: Diagnosis not present

## 2021-08-20 DIAGNOSIS — R0602 Shortness of breath: Secondary | ICD-10-CM | POA: Diagnosis not present

## 2021-08-20 DIAGNOSIS — Z23 Encounter for immunization: Secondary | ICD-10-CM | POA: Diagnosis not present

## 2021-08-20 DIAGNOSIS — R42 Dizziness and giddiness: Secondary | ICD-10-CM

## 2021-08-20 DIAGNOSIS — R21 Rash and other nonspecific skin eruption: Secondary | ICD-10-CM

## 2021-08-20 DIAGNOSIS — R519 Headache, unspecified: Secondary | ICD-10-CM

## 2021-08-20 DIAGNOSIS — I493 Ventricular premature depolarization: Secondary | ICD-10-CM | POA: Diagnosis not present

## 2021-08-20 NOTE — Patient Instructions (Addendum)
I do not see any specific changes on your EKG today and recent stress testing was reassuring.  Heart rate is low today.  Try decreasing the metoprolol to 1/2 pill twice per day, relative rest the next few days, and recheck in 3 days.  Make sure you are drinking plenty of fluids. If any return of chest tightness or any worsening of symptoms call 911 or be seen in ER as we discussed. I would also recommend calling cardiology about these symptoms tomorrow.  If any worsening of rash on arms be see, but we can recheck in 2 days as well.   Return to the clinic or go to the nearest emergency room if any of your symptoms worsen or new symptoms occur.

## 2021-08-20 NOTE — Progress Notes (Signed)
Subjective:  Patient ID: Thomas Harding, male    DOB: 1956/04/04  Age: 65 y.o. MRN: [id number]  CC:  Chief Complaint  Patient presents with   Hyperlipidemia    Pt doing okay due for recheck    HPI Thomas Harding presents for   Hyperlipidemia: Pravastatin 20 mg daily.  No new myalgias/side effects.  Lab Results  Component Value Date   CHOL 179 02/15/2021   HDL 39.50 02/15/2021   LDLCALC 102 (H) 02/15/2021   TRIG 188.0 (H) 02/15/2021   CHOLHDL 5 02/15/2021   Lab Results  Component Value Date   ALT 26 02/15/2021   AST 22 02/15/2021   ALKPHOS 74 02/15/2021   BILITOT 1.0 02/15/2021   Paroxysmal atrial tachycardia Metoprolol 25 mg twice daily, cardiologist Dr. Virgina Jock Office visit October 20.  Improving at that time, PVC burden 1.2% and no PVCs at rest.  Continued on metoprolol.  Question of possible underlying sleep apnea. No change in pvc's.  More short of breath past few days - notices in am, some with activity. No chest pain, some tightness in chest with dyspnea. New symptom with activity past few days. some dizziness at times with movement or carrying things. No syncope.  Some headache past few days.  Chronic cough without recent changes.  No melena/hematochezia.  Eating, drinking normally. Some left calf 2-3 weeks ago, resolved in 20 minutes. No calf swelling, no hx of DVT. No recent prolonged car travel or air travel No chest pain or tightness of chest in office.  BP Readings from Last 3 Encounters:  08/20/21 128/78  08/02/21 124/73  06/07/21 (!) 150/58    Hx of bradycardia. Pulse 68, BP 124/73 at cardiology October  Echo 06/26/21 - EF 60%.  No significant valvular abnormality, no evidence of pulmonary hypertension and no significant change compared to previous study in 2017 per cardiology note.   Mobile cardiac telemetry 913 12/20/2020 with 1.2% isolated VE, heart rate 48-1 34 average heart rate 69 bpm.  2 episodes of probable atrial tachycardia fastest at 116.   No atrial fibrillation, flutter, V. tach, high-grade AV block.  Exercise treadmill stress test 08/02/2021 using Bruce protocol, patient reached 7.5 METS, 88% of age-predicted maximum heart rate.  No chest pain or dyspnea during study.  Fair exercise capacity.  Low risk study.  Frequent PVCs but no ischemic changes on EKG.  New rash on arms - yesterday after cleaning up a house. No itching. Few red whelps. No genital or mouth lesions. No treatments.     History Patient Active Problem List   Diagnosis Date Noted   Paroxysmal atrial tachycardia ([place]) 08/02/2021   Precordial pain 06/07/2021   Bradycardia 06/22/2020   PVC (premature ventricular contraction) 06/22/2020   Spinal stenosis at L4-L5 level 05/18/2020   Shortness of breath 11/22/2016   Past Medical History:  Diagnosis Date   Hyperlipidemia    Squamous cell carcinoma of skin 01/16/2017   in situ- right crown    Past Surgical History:  Procedure Laterality Date   LUMBAR LAMINECTOMY/DECOMPRESSION MICRODISCECTOMY Left 05/18/2020   Procedure: Microlumbar decompression Lumbar Four- Lumbar Five, Lumbar Five- Sacral One left;  Surgeon: Susa Day, MD;  Location: [facility];  Service: Orthopedics;  Laterality: Left;  Microlumbar decompression Lumbar Five- Sacral One left   ROTATOR CUFF REPAIR Right    thumb surgery Left    TONSILLECTOMY     VASECTOMY     Allergies  Allergen Reactions   Bee Venom Anaphylaxis   Penicillins  Hives and Swelling   Prior to Admission medications   Medication Sig Start Date End Date Taking? Authorizing Provider  acetaminophen (TYLENOL) 325 MG tablet Take 650 mg by mouth every 6 (six) hours as needed.   Yes [provider]  ALPHA LIPOIC ACID PO Take 2 capsules by mouth daily.   Yes [provider]  metoprolol tartrate (LOPRESSOR) 25 MG tablet Take 1 tablet (25 mg total) by mouth 2 (two) times daily. 08/02/21 10/31/21 Yes Patwardhan, Manish J, MD  Multiple Vitamin (MULTIVITAMIN) tablet Take  1 tablet by mouth daily.   Yes [provider]  pravastatin (PRAVACHOL) 20 MG tablet TAKE ONE TABLET BY MOUTH DAILY 08/09/21  Yes Midge Minium, MD   Social History   Socioeconomic History   Marital status: Married    Spouse name: Not on file   Number of children: 2   Years of education: Not on file   Highest education level: Not on file  Occupational History   Not on file  Tobacco Use   Smoking status: Never   Smokeless tobacco: Never  Vaping Use   Vaping Use: Never used  Substance and Sexual Activity   Alcohol use: Yes    Comment: occ   Drug use: No   Sexual activity: Yes  Other Topics Concern   Not on file  Social History Narrative   Not on file   Social Determinants of Health   Financial Resource Strain: Not on file  Food Insecurity: Not on file  Transportation Needs: Not on file  Physical Activity: Not on file  Stress: Not on file  Social Connections: Not on file  Intimate Partner Violence: Not on file    Review of Systems   Objective:   Vitals:   08/20/21 1559  BP: 128/78  Pulse: (!) 49  Resp: 15  Temp: 97.8 F (36.6 C)  TempSrc: Temporal  SpO2: 97%  Weight: 228 lb 9.6 oz (103.7 kg)  Height: 5\' 11"  (1.803 m)     Physical Exam Vitals reviewed.  Constitutional:      General: He is not in acute distress.    Appearance: He is well-developed. He is not ill-appearing or toxic-appearing.  HENT:     Head: Normocephalic and atraumatic.  Neck:     Vascular: No carotid bruit or JVD.  Cardiovascular:     Rate and Rhythm: Regular rhythm. Bradycardia present.     Heart sounds: Normal heart sounds. No murmur heard. Pulmonary:     Effort: Pulmonary effort is normal.     Breath sounds: Normal breath sounds. No rales.  Musculoskeletal:     Right lower leg: No edema.     Left lower leg: No edema.  Skin:    General: Skin is warm and dry.     Comments: Few scattered faint papules across the volar forearms, elbows bilaterally without  erythema, vesicles, surrounding induration or discharge.  No apparent urticaria  Neurological:     General: No focal deficit present.     Mental Status: He is alert and oriented to person, place, and time.     Comments: Equal grip strength, equal upper extremity and lower extremity movements, no focal weakness, normal facial movements, no facial droop.  No pronator drift.  Psychiatric:        Mood and Affect: Mood normal.        Behavior: Behavior normal.    EKG: Sinus bradycardia, rate 52.  No apparent acute ST/T wave changes.  Compared to 06/07/2021,  resolution of frequent PVCs.    Assessment & Plan:  Thomas Harding is a 65 y.o. male . PVC (premature ventricular contraction) - Plan: EKG 12-Lead Shortness of breath - Plan: CBC, EKG 12-Lead Chest tightness - Plan: EKG 12-Lead Nonintractable headache, unspecified chronicity pattern, unspecified headache type Lightheadedness - Plan: CBC  -Above symptoms past few days.  No acute findings on in-office EKG, nonfocal neurologic exam.  Recent stress testing, echo, monitoring overall reassuring.  Minimal symptoms per patient and mentioned more as an aside during visit.  Discussed ER evaluation given current symptoms but declined at this time.  Outpatient eval initially with CBC, advised to contact cardiology to discuss the symptoms and plan, recheck in 48 to 72 hours with ER/911 precautions given.  - tolerating current statin regimen. Medications refilled. Labs pending as above. continue same at this time.   Rash and nonspecific skin eruption  -possible mild contact dermatitis, minimal sx's at present. Recheck in 48-72 hours.   No orders of the defined types were placed in this encounter.  Patient Instructions  I do not see any specific changes on your EKG today and recent stress testing was reassuring.  Heart rate is low today.  Try decreasing the metoprolol to 1/2 pill twice per day, relative rest the next few days, and recheck in 2 days.   Make sure you are drinking plenty of fluids. If any return of chest tightness or any worsening of symptoms call 911 or be seen in ER as we discussed. I would also recommend calling cardiology about these symptoms tomorrow.  If any worsening of rash on arms be see, but we can recheck in 2 days as well.   Return to the clinic or go to the nearest emergency room if any of your symptoms worsen or new symptoms occur.     Signed,   Merri Ray, MD [place] 08/20/21 5:33 PM

## 2021-08-21 LAB — CBC
HCT: 40.6 % (ref 39.0–52.0)
Hemoglobin: 13.5 g/dL (ref 13.0–17.0)
MCHC: 33.4 g/dL (ref 30.0–36.0)
MCV: 87.6 fl (ref 78.0–100.0)
Platelets: 191 10*3/uL (ref 150.0–400.0)
RBC: 4.63 Mil/uL (ref 4.22–5.81)
RDW: 12.8 % (ref 11.5–15.5)
WBC: 7 10*3/uL (ref 4.0–10.5)

## 2021-08-23 ENCOUNTER — Encounter: Payer: Self-pay | Admitting: Family Medicine

## 2021-08-23 ENCOUNTER — Ambulatory Visit (INDEPENDENT_AMBULATORY_CARE_PROVIDER_SITE_OTHER): Payer: BLUE CROSS/BLUE SHIELD | Admitting: Family Medicine

## 2021-08-23 VITALS — BP 132/78 | HR 68 | Temp 98.2°F | Resp 15 | Ht 71.0 in | Wt 228.8 lb

## 2021-08-23 DIAGNOSIS — R42 Dizziness and giddiness: Secondary | ICD-10-CM | POA: Diagnosis not present

## 2021-08-23 DIAGNOSIS — R0602 Shortness of breath: Secondary | ICD-10-CM

## 2021-08-23 DIAGNOSIS — R0789 Other chest pain: Secondary | ICD-10-CM | POA: Diagnosis not present

## 2021-08-23 DIAGNOSIS — R5383 Other fatigue: Secondary | ICD-10-CM

## 2021-08-23 DIAGNOSIS — I493 Ventricular premature depolarization: Secondary | ICD-10-CM | POA: Diagnosis not present

## 2021-08-23 DIAGNOSIS — R400 Somnolence: Secondary | ICD-10-CM

## 2021-08-23 DIAGNOSIS — R200 Anesthesia of skin: Secondary | ICD-10-CM

## 2021-08-23 NOTE — Progress Notes (Signed)
Subjective:  Patient ID: Thomas Harding, male    DOB: [birth year]  Age: 65 y.o. MRN: [id number]  CC:  Chief Complaint  Patient presents with   Chest Pain    Pt cut back on meds to half pill, reports no sxs, BP has been slightly elevated, pulse still low end    HPI [name] presents for   Chest tightness, dyspnea, and episodic lightheadedness Follow-up from visit 3 days ago.  Minimal symptoms at that time.  No acute findings on an office EKG, nonfocal neurologic exam.  He had completed a recent stress test, echo and heart monitor which were overall reassuring.  Trial of decrease metoprolol dose at 1/2 pill twice per day given bradycardia.  ER precautions given.  Also advised discussion with his cardiologist.  Since last visit he has decrease his metoprolol to 1/2 pill twice per day of the 25mg .  Denies any recurrence of any dyspnea, chest tightness, or lightheadedness No dark stools.  Feeling fine now. No new palpitations.  Lab Results  Component Value Date   WBC 7.0 08/20/2021   HGB 13.5 08/20/2021   HCT 40.6 08/20/2021   MCV 87.6 08/20/2021   PLT 191.0 08/20/2021   BP Readings from Last 3 Encounters:  08/23/21 132/78  08/20/21 128/78  08/02/21 124/73   Fatigue, daytime somnolence.  No known snoring. Above sx for years. No prior sleep study. Not fully rested after 8 hrs sleep. Wakes up once at night.   L foot numbness:  Past year left side with slight numbness in R foot. Worse in left. No burning or pain. Notices primarily in toes and undersurface. No known injury, but had sciatica and prior back surgery. Minimal numbness before surgery  (05/2020, hx of lumbar radiculopathy). Will sometimes stumble, just loss of feeling where it is located, no foot drop. Cramps in left leg at times. No calf swelling.   History Patient Active Problem List   Diagnosis Date Noted   Paroxysmal atrial tachycardia ([place]) 08/02/2021   Precordial pain 06/07/2021   Bradycardia 06/22/2020    PVC (premature ventricular contraction) 06/22/2020   Spinal stenosis at L4-L5 level 05/18/2020   Shortness of breath 11/22/2016   Past Medical History:  Diagnosis Date   Hyperlipidemia    Squamous cell carcinoma of skin 01/16/2017   in situ- right crown    Past Surgical History:  Procedure Laterality Date   LUMBAR LAMINECTOMY/DECOMPRESSION MICRODISCECTOMY Left 05/18/2020   Procedure: Microlumbar decompression Lumbar Four- Lumbar Five, Lumbar Five- Sacral One left;  Surgeon: Susa Day, MD;  Location: [facility];  Service: Orthopedics;  Laterality: Left;  Microlumbar decompression Lumbar Five- Sacral One left   ROTATOR CUFF REPAIR Right    thumb surgery Left    TONSILLECTOMY     VASECTOMY     Allergies  Allergen Reactions   Bee Venom Anaphylaxis   Penicillins Hives and Swelling   Prior to Admission medications   Medication Sig Start Date End Date Taking? Authorizing Provider  acetaminophen (TYLENOL) 325 MG tablet Take 650 mg by mouth every 6 (six) hours as needed.   Yes [provider]  ALPHA LIPOIC ACID PO Take 2 capsules by mouth daily.   Yes [provider]  metoprolol tartrate (LOPRESSOR) 25 MG tablet Take 1 tablet (25 mg total) by mouth 2 (two) times daily. 08/02/21 10/31/21 Yes Patwardhan, Manish J, MD  Multiple Vitamin (MULTIVITAMIN) tablet Take 1 tablet by mouth daily.   Yes [provider]  pravastatin (PRAVACHOL) 20  MG tablet TAKE ONE TABLET BY MOUTH DAILY 08/09/21  Yes Midge Minium, MD   Social History   Socioeconomic History   Marital status: Married    Spouse name: Not on file   Number of children: 2   Years of education: Not on file   Highest education level: Not on file  Occupational History   Not on file  Tobacco Use   Smoking status: Never   Smokeless tobacco: Never  Vaping Use   Vaping Use: Never used  Substance and Sexual Activity   Alcohol use: Yes    Comment: occ   Drug use: No   Sexual activity: Yes  Other Topics  Concern   Not on file  Social History Narrative   Not on file   Social Determinants of Health   Financial Resource Strain: Not on file  Food Insecurity: Not on file  Transportation Needs: Not on file  Physical Activity: Not on file  Stress: Not on file  Social Connections: Not on file  Intimate Partner Violence: Not on file    Review of Systems Per HPI   Objective:   Vitals:   08/23/21 1533  BP: 132/78  Pulse: 68  Resp: 15  Temp: 98.2 F (36.8 C)  TempSrc: Temporal  SpO2: 96%  Weight: 228 lb 12.8 oz (103.8 kg)  Height: 5\' 11"  (1.803 m)     Physical Exam Vitals reviewed.  Constitutional:      Appearance: He is well-developed.  HENT:     Head: Normocephalic and atraumatic.  Neck:     Vascular: No carotid bruit or JVD.  Cardiovascular:     Rate and Rhythm: Normal rate and regular rhythm.     Heart sounds: Normal heart sounds. No murmur heard. Pulmonary:     Effort: Pulmonary effort is normal.     Breath sounds: Normal breath sounds. No rales.  Musculoskeletal:     Right lower leg: No edema.     Left lower leg: No edema.     Comments: Decreased sensation dorsal foot, especially distally on the left.  Cap refill less than 1 second, toes warm. Calves nontender, no edema, no cords.  Skin:    General: Skin is warm and dry.  Neurological:     Mental Status: He is alert and oriented to person, place, and time.  Psychiatric:        Mood and Affect: Mood normal.  No foot drop, strength intact with resisted dorsiflexion, plantarflexion of feet bilaterally.   Assessment & Plan:  [name] is a 65 y.o. male . PVC (premature ventricular contraction) Shortness of breath Chest tightness Lightheadedness  - prior symptoms have improved.  Still recommended discussing with his cardiologist but appears to be better at lower dose of beta-blocker.  Continue same.  RTC/ER precautions given.  Daytime somnolence - Plan: Ambulatory referral to Sleep Studies Other  fatigue - Plan: Ambulatory referral to Sleep Studies  -Differential includes sleep apnea, refer to sleep specialist for evaluation and possible testing  Numbness of foot  -Potentially related to previous lumbar radiculopathy.  Recommended discussing with his back surgeon to determine if further testing such as nerve conduction testing indicated.   No orders of the defined types were placed in this encounter.  Patient Instructions  Stay on 1/2 dose of metoprolol for now. Advise cardiologist about your prior symptoms as you may need to see him sooner. If any return or worsening symptoms - be seen in ER.   Foot numbness  and lower leg symptoms could be due to back issue. I would recommend discussing with Dr. Tonita Cong to see if other testing needed.  I will refer you to sleep specialist.   Return to the clinic or go to the nearest emergency room if any of your symptoms worsen or new symptoms occur.    Signed,   Merri Ray, MD [place], [facility] 08/23/21 4:27 PM

## 2021-08-23 NOTE — Patient Instructions (Addendum)
Stay on 1/2 dose of metoprolol for now. Advise cardiologist about your prior symptoms as you may need to see him sooner. If any return or worsening symptoms - be seen in ER.   Foot numbness and lower leg symptoms could be due to back issue. I would recommend discussing with Dr. Tonita Cong to see if other testing needed.  I will refer you to sleep specialist.   Return to the clinic or go to the nearest emergency room if any of your symptoms worsen or new symptoms occur.

## 2021-09-19 ENCOUNTER — Other Ambulatory Visit: Payer: Self-pay

## 2021-09-19 ENCOUNTER — Ambulatory Visit: Payer: BLUE CROSS/BLUE SHIELD | Admitting: Dermatology

## 2021-09-19 ENCOUNTER — Encounter: Payer: Self-pay | Admitting: Dermatology

## 2021-09-19 DIAGNOSIS — L820 Inflamed seborrheic keratosis: Secondary | ICD-10-CM | POA: Diagnosis not present

## 2021-09-19 DIAGNOSIS — L570 Actinic keratosis: Secondary | ICD-10-CM | POA: Diagnosis not present

## 2021-09-19 DIAGNOSIS — L821 Other seborrheic keratosis: Secondary | ICD-10-CM | POA: Diagnosis not present

## 2021-10-08 ENCOUNTER — Encounter: Payer: Self-pay | Admitting: Dermatology

## 2021-10-08 NOTE — Progress Notes (Signed)
° °  Follow-Up Visit   Subjective  Thomas Harding is a 65 y.o. male who presents for the following: Follow-up (Patient here today for follow up from LN2 on mid parietal scalp and right malar cheek. Per patient healed well. Per patient he has a lesion on his chest, scalp, and posterior neck x years no bleeding no pain from any of the lesions. ).  Several more crusts Location:  Duration:  Quality:  Associated Signs/Symptoms: Modifying Factors:  Severity:  Timing: Context:   Objective  Well appearing patient in no apparent distress; mood and affect are within normal limits. Chest - Medial [facility]), Right Malar Cheek Right cheek and chest hornlike 3 to 4 mm pink crusts     Right Occipital Scalp, Right Postauricular Area Behind right ear On exam findings: Pink-tan inflamed textured papules, dermoscopy compatible  Left Temple, Mid Parietal Scalp, Right Temple Noninflamed Brown flattopped textured 4 mm papules    All skin waist up examined.   Assessment & Plan    AK (actinic keratosis) (2) Chest - Medial [facility]); Right Malar Cheek  Destruction of lesion - Chest - Medial (Center), Right Malar Cheek Complexity: simple   Destruction method: cryotherapy   Informed consent: discussed and consent obtained   Timeout:  patient name, date of birth, surgical site, and procedure verified Lesion destroyed using liquid nitrogen: Yes   Cryotherapy cycles:  4 Outcome: patient tolerated procedure well with no complications   Post-procedure details: wound care instructions given    Inflamed seborrheic keratosis Right Occipital Scalp; Right Postauricular Area  Intervention deferred  Seborrheic keratosis (3) Mid Parietal Scalp; Left Temple; Right Temple  Leave if stable      I, Lavonna Monarch, MD, have reviewed all documentation for this visit.  The documentation on 10/08/21 for the exam, diagnosis, procedures, and orders are all accurate and complete.

## 2021-11-06 ENCOUNTER — Institutional Professional Consult (permissible substitution): Payer: BLUE CROSS/BLUE SHIELD | Admitting: Neurology

## 2021-12-10 DIAGNOSIS — H52203 Unspecified astigmatism, bilateral: Secondary | ICD-10-CM | POA: Diagnosis not present

## 2021-12-10 DIAGNOSIS — H2513 Age-related nuclear cataract, bilateral: Secondary | ICD-10-CM | POA: Diagnosis not present

## 2021-12-10 DIAGNOSIS — H524 Presbyopia: Secondary | ICD-10-CM | POA: Diagnosis not present

## 2021-12-10 DIAGNOSIS — H5203 Hypermetropia, bilateral: Secondary | ICD-10-CM | POA: Diagnosis not present

## 2022-01-31 ENCOUNTER — Encounter: Payer: Self-pay | Admitting: Cardiology

## 2022-01-31 ENCOUNTER — Ambulatory Visit: Payer: Medicare Other | Admitting: Cardiology

## 2022-01-31 VITALS — BP 129/81 | HR 58 | Temp 98.0°F | Resp 16 | Ht 71.0 in | Wt 229.0 lb

## 2022-01-31 DIAGNOSIS — I739 Peripheral vascular disease, unspecified: Secondary | ICD-10-CM | POA: Diagnosis not present

## 2022-01-31 DIAGNOSIS — I493 Ventricular premature depolarization: Secondary | ICD-10-CM | POA: Diagnosis not present

## 2022-01-31 DIAGNOSIS — R0609 Other forms of dyspnea: Secondary | ICD-10-CM | POA: Diagnosis not present

## 2022-01-31 NOTE — Progress Notes (Signed)
? ? ?Patient referred by [name], MD for bradycardia ? ?Subjective:  ? ?Thomas Harding, male    DOB: Nov 12, 1955, 66 y.o.   MRN: [id number] ? ? ?Chief Complaint  ?Patient presents with  ? Atrial Fibrillation  ? PVC  ? Follow-up  ?  6 month  ? ? ? ?HPI ? ?66 y.o. Caucasian male with PVC ? ?He had lightheadedness with metoprolol tartrate 25 mg bid, thus reduced to 12.5 mg bid. Symptoms controlled well. He has experienced dyspnea on exertion. In addition, he also reports that his feet stay cold, and he also experiences cramps in his calves and legs on walking.  ? ?Initial consultation HPI 06/2020: ?Patient recently underwent back surgery.  Preoperatively, he was noticed to have frequent PVCs and bradycardia.  He was therefore referred for further evaluation.  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  He denies any exertional chest pain, overt dyspnea, presyncope, syncope.  He previously underwent echocardiogram exercise stress test 2017, both were unremarkable. ? ? ?Current Outpatient Medications:  ?  acetaminophen (TYLENOL) 325 MG tablet, Take 650 mg by mouth every 6 (six) hours as needed., Disp: , Rfl:  ?  ALPHA LIPOIC ACID PO, Take 2 capsules by mouth daily., Disp: , Rfl:  ?  metoprolol tartrate (LOPRESSOR) 25 MG tablet, Take 1 tablet (25 mg total) by mouth 2 (two) times daily., Disp: 180 tablet, Rfl: 3 ?  Multiple Vitamin (MULTIVITAMIN) tablet, Take 1 tablet by mouth daily., Disp: , Rfl:  ?  pravastatin (PRAVACHOL) 20 MG tablet, TAKE ONE TABLET BY MOUTH DAILY, Disp: 90 tablet, Rfl: 1 ? ?Cardiovascular and other pertinent studies: ? ?EKG 01/31/2022: ?Sinus rhythm 57 bpm ?Low voltage in precordial leads ? ?Exercise treadmill stress test 08/02/2021: ?Exercise treadmill stress test performed using Bruce protocol.  Patient reached 7.5 METS, and 88% of age predicted maximum heart rate.  Exercise capacity was fair.  No chest pain reported, dyspnea reported.  Normal heart rate and  hemodynamic response. Stress EKG revealed no ischemic changes, showed frequent PVCs. ?Low risk study. ? ?Mobile cardiac telemetry 7 days 06/26/2021 - 07/03/2021: ?Dominant rhythm: Sinus. ?HR 48-134 bpm. Avg HR 69 bpm. ?2 episodes of probable atrial tachycardia, fastest at 116 bpm for 6 beats, longest for 31.6 secs at 91 bpm. ?<1% isolated SVE, couplet/triplets. ?0 episodes of VT ?1.2% isolated VE, <1 couplet/triplets. ?No atrial fibrillation/atrial flutter/VT/high grade AV block, sinus pause >3sec noted. ?0 patient triggered events.  ? ?Echocardiogram 06/26/2021:  ?Left ventricle cavity is normal in size and wall thickness. Normal global  ?wall motion. Normal LV systolic function with EF 60%. Normal diastolic  ?filling pattern.  ?No significant valvular abnormality.  ?No evidence of pulmonary hypertension.  ?No significant change compared to previous study in 2017. ? ?EKG 06/07/2021: ?Sinus rhythm 93 bpm ?Frequent pvcs -ventricular bigeminy and trigeminy ? ?Echocardiogram 07/04/2020:  ?Left ventricle cavity is normal in size and wall thickness. Normal global  ?wall motion. Normal LV systolic function with EF 64%. Normal diastolic  ?filling pattern.  ?Left atrial cavity is normal in size.  ?Mild tricuspid regurgitation.  ?No evidence of pulmonary hypertension. ? ?Long term monitor 7 days 06/22/2020 - 06/29/2020: ?Dominant rhythm: Sinus. ?HR 54-214 bpm. Avg HR 80 bpm. ?4 episodes of VT, fastest at 214 bpm for 4 beats, longest for 5 beats at 141 bpm. ?Isolated VE 10%. Rare couplet, triplet ?2 episodes of SVT, fastest at 143 bpm for 6 beats, longest for 9 beats at 135  bpm. ?Rare SVE ?No atrial fibrillation/atrial flutter/high grade AV block, sinus pause >3sec noted. ?3 patient triggered events, 2 associated with VE. ?  ? ?Exercise treadmill stress test 2017: ?10.1 METS. No ischemia ? ? ?Recent labs: ?02/15/2021: ?Glucose 111, BUN/Cr 20/1.23. EGFR 62. Na/K 140/4.2. Rest of the CMP normal ?H/H 13/40. MCV 87. Platelets 192 ?Chol  179, TG 139, HDL 39, LDL 102 ? ?05/19/2020: ?Glucose 187, BUN/Cr 20/1.34. EGFR 56. Na/K 137/4.6. Albumin 3.3. Rest of the CMP normal ?H/H 13/43. MCV 89. Platelets 206 ? ? ?Review of Systems  ?Cardiovascular:  Positive for chest pain, dyspnea on exertion and palpitations. Negative for leg swelling and syncope.  ? ?   ? ? ?Vitals:  ? 01/31/22 1336  ?BP: 129/81  ?Pulse: (!) 58  ?Resp: 16  ?Temp: 98 ?F (36.7 ?C)  ?SpO2: 98%  ? ? ? ?Body mass index is 31.94 kg/m?. ?Filed Weights  ? 01/31/22 1336  ?Weight: 229 lb (103.9 kg)  ? ? ? ?Objective:  ? Physical Exam ?Vitals and nursing note reviewed.  ?Constitutional:   ?   General: He is not in acute distress. ?Neck:  ?   Vascular: No JVD.  ?Cardiovascular:  ?   Rate and Rhythm: Normal rate and regular rhythm. FrequentExtrasystoles are present. ?   Heart sounds: Normal heart sounds. No murmur heard. ?Pulmonary:  ?   Effort: Pulmonary effort is normal.  ?   Breath sounds: Normal breath sounds. No wheezing or rales.  ?Musculoskeletal:  ?   Right lower leg: No edema.  ?   Left lower leg: No edema.  ? ? ? ?   ? ?Assessment & Recommendations:  ? ?66 y.o. Caucasian male with symptomatic PVC, dyspnea on exertion, claudication ? ?Symptomatic PVC's, paroxysmal atrial tachycardia: ?Continue metoprolol tartrate, now at 12.5 mg bid. ? ?Exertional dyspnea: ?Given unexplained dyspnea, I will obtain CT cardiac scoring. If high calcium, will obtain exercise nuclear stress test. ? ?Claudication: ?Recommend LE [place] ? ?F/u in 3 months ? ? ?Nigel Mormon, MD ?Pager: [phone] ?Office: [phone] ?

## 2022-02-07 ENCOUNTER — Ambulatory Visit: Payer: Medicare Other

## 2022-02-07 ENCOUNTER — Telehealth: Payer: Self-pay | Admitting: Cardiology

## 2022-02-07 ENCOUNTER — Other Ambulatory Visit: Payer: Self-pay

## 2022-02-07 DIAGNOSIS — I739 Peripheral vascular disease, unspecified: Secondary | ICD-10-CM | POA: Diagnosis not present

## 2022-02-07 DIAGNOSIS — I493 Ventricular premature depolarization: Secondary | ICD-10-CM

## 2022-02-07 MED ORDER — METOPROLOL SUCCINATE ER 25 MG PO TB24: 25.0000 mg | ORAL_TABLET | Freq: Every day | ORAL | 3 refills | Status: DC

## 2022-02-07 NOTE — Telephone Encounter (Signed)
Called pt and send the medication to pharmacy. Pt understood

## 2022-02-07 NOTE — Telephone Encounter (Signed)
Okay to switch to metoprolol succinate 25 mg daily. Please send 90 pillsX2 refills. ? ?Thanks ?MJP ? ?

## 2022-02-07 NOTE — Telephone Encounter (Signed)
Patient says at last appointment, there was discussion on changing his metoprolol to a slow release metoprolol, and patient would like to verify. He's here for an echo appointment today. ?

## 2022-02-20 ENCOUNTER — Ambulatory Visit (INDEPENDENT_AMBULATORY_CARE_PROVIDER_SITE_OTHER): Payer: Medicare Other | Admitting: Family Medicine

## 2022-02-20 VITALS — BP 138/74 | HR 76 | Temp 98.0°F | Resp 16 | Ht 71.0 in | Wt 229.0 lb

## 2022-02-20 DIAGNOSIS — R200 Anesthesia of skin: Secondary | ICD-10-CM | POA: Diagnosis not present

## 2022-02-20 DIAGNOSIS — M21372 Foot drop, left foot: Secondary | ICD-10-CM

## 2022-02-20 DIAGNOSIS — E782 Mixed hyperlipidemia: Secondary | ICD-10-CM

## 2022-02-20 DIAGNOSIS — M48061 Spinal stenosis, lumbar region without neurogenic claudication: Secondary | ICD-10-CM

## 2022-02-20 MED ORDER — PRAVASTATIN SODIUM 20 MG PO TABS: 20.0000 mg | ORAL_TABLET | Freq: Every day | ORAL | 3 refills | Status: DC

## 2022-02-20 NOTE — Progress Notes (Signed)
? ?Subjective:  ?Patient ID: Thomas Harding, male    DOB: 10/18/1955  Age: 66 y.o. MRN: [id number] ? ?CC:  ?Chief Complaint  ?Patient presents with  ? Hyperlipidemia  ?  Pt here for refill on pravastatin, no concerns, due for labs   ? Peripheral Neuropathy  ?  Pt would like to discuss treatment/examination/referral for neuropathy, pt notes all in his feet, pt reports not so much tingling mostly numbness mostly effecting LT   ? ? ?HPI ?Thomas Harding presents for  ? ?Hyperlipidemia: ?Pravastatin 20 mg daily. No new myalgias.  ?Followed by cardiology Dr. Virgina Harding with PVCs, bradycardia, did not tolerate metoprolol higher dose, continued on 12.5 mg twice daily.  CT cardiac scoring ordered initially with option of nuclear stress testing based on review of April 20 visit. CCS on 03/14/22.  ?Last ate hr ago - not fasting.  ?Out of statin 1 day.  ?Lab Results  ?Component Value Date  ? CHOL 179 02/15/2021  ? HDL 39.50 02/15/2021  ? LDLCALC 102 (H) 02/15/2021  ? TRIG 188.0 (H) 02/15/2021  ? CHOLHDL 5 02/15/2021  ? ?Lab Results  ?Component Value Date  ? ALT 26 02/15/2021  ? AST 22 02/15/2021  ? ALKPHOS 74 02/15/2021  ? BILITOT 1.0 02/15/2021  ? ?Neuropathy/foot numbness ?Left greater than right.numb with some burning at times. Arch of foot to toes. ?Overall stable, just persistent. Careful for stability. Some trouble with steps at times. Trips on left foot at times. Not sure picking up enough. Some numbness side of foot.  ?No pain, does not want to take meds. No gabapentin  ?History of lumbar spine surgery - microlumbar decompression L4-5, with lumbar radiculopathy left side, treated by Thomas Harding with [facility].  Claudication was discussed with cardiology.  Lower arterial testing performed on April 27 without hemodynamically significant stenoses in the right lower arterial system, or left lower system.  Normal perfusion with ABI 1.14 on the right, 1.09 on the left.  Minimal homogenous plaque in bilateral lower extremity  but normal triphasic waveforms at the ankles. ? ? ?History ?Patient Active Problem List  ? Diagnosis Date Noted  ? Claudication ([place]) 01/31/2022  ? Paroxysmal atrial tachycardia ([place]) 08/02/2021  ? Precordial pain 06/07/2021  ? Bradycardia 06/22/2020  ? PVC (premature ventricular contraction) 06/22/2020  ? Spinal stenosis at L4-L5 level 05/18/2020  ? Exertional dyspnea 11/22/2016  ? ?Past Medical History:  ?Diagnosis Date  ? Hyperlipidemia   ? Squamous cell carcinoma of skin 01/16/2017  ? in situ- right crown   ? ?Past Surgical History:  ?Procedure Laterality Date  ? LUMBAR LAMINECTOMY/DECOMPRESSION MICRODISCECTOMY Left 05/18/2020  ? Procedure: Microlumbar decompression Lumbar Four- Lumbar Five, Lumbar Five- Sacral One left;  Surgeon: Susa Day, MD;  Location: [facility];  Service: Orthopedics;  Laterality: Left;  Microlumbar decompression Lumbar Five- Sacral One left  ? ROTATOR CUFF REPAIR Right   ? thumb surgery Left   ? TONSILLECTOMY    ? VASECTOMY    ? ?Allergies  ?Allergen Reactions  ? Bee Venom Anaphylaxis  ? Penicillins Hives and Swelling  ? ?Prior to Admission medications   ?Medication Sig Start Date End Date Taking? Authorizing Provider  ?acetaminophen (TYLENOL) 325 MG tablet Take 650 mg by mouth every 6 (six) hours as needed.   Yes [provider]  ?ALPHA LIPOIC ACID PO Take 2 capsules by mouth daily.   Yes [provider]  ?metoprolol succinate (TOPROL XL) 25 MG 24 hr tablet Take 1 tablet (25 mg total)  by mouth daily. 02/07/22  Yes Patwardhan, Manish J, MD  ?Multiple Vitamin (MULTIVITAMIN) tablet Take 1 tablet by mouth daily.   Yes [provider]  ?pravastatin (PRAVACHOL) 20 MG tablet TAKE ONE TABLET BY MOUTH DAILY 08/09/21  Yes Midge Minium, MD  ? ?Social History  ? ?Socioeconomic History  ? Marital status: Married  ?  Spouse name: Not on file  ? Number of children: 2  ? Years of education: Not on file  ? Highest education level: Not on file  ?Occupational History  ? Not  on file  ?Tobacco Use  ? Smoking status: Never  ? Smokeless tobacco: Never  ?Vaping Use  ? Vaping Use: Never used  ?Substance and Sexual Activity  ? Alcohol use: Yes  ?  Comment: occ  ? Drug use: No  ? Sexual activity: Yes  ?Other Topics Concern  ? Not on file  ?Social History Narrative  ? Not on file  ? ?Social Determinants of Health  ? ?Financial Resource Strain: Not on file  ?Food Insecurity: Not on file  ?Transportation Needs: Not on file  ?Physical Activity: Not on file  ?Stress: Not on file  ?Social Connections: Not on file  ?Intimate Partner Violence: Not on file  ? ?Review of Systems ? ?Per HPI.  ? ?Objective:  ? ?Vitals:  ? 02/20/22 1046  ?BP: 138/74  ?Pulse: 76  ?Resp: 16  ?Temp: 98 ?F (36.7 ?C)  ?TempSrc: Temporal  ?SpO2: 98%  ?Weight: 229 lb (103.9 kg)  ?Height: '5\' 11"'$  (1.803 m)  ? ? ? ?Physical Exam ?Vitals reviewed.  ?Constitutional:   ?   Appearance: He is well-developed.  ?HENT:  ?   Head: Normocephalic and atraumatic.  ?Neck:  ?   Vascular: No carotid bruit or JVD.  ?Cardiovascular:  ?   Rate and Rhythm: Normal rate and regular rhythm.  ?   Heart sounds: Normal heart sounds. No murmur heard. ?Pulmonary:  ?   Effort: Pulmonary effort is normal.  ?   Breath sounds: Normal breath sounds. No rales.  ?Musculoskeletal:  ?   Right lower leg: No edema.  ?   Left lower leg: No edema.  ?   Comments: Decreased sensation distal toes left foot.  Slight weakness with dorsiflexion of the left versus right foot.  He is able to dorsiflex with ambulation in hallway.  Negative seated SLR bilat.  ?  ?Skin: ?   General: Skin is warm and dry.  ?Neurological:  ?   Mental Status: He is alert and oriented to person, place, and time.  ?Psychiatric:     ?   Mood and Affect: Mood normal.  ? ? ? ? ? ?Assessment & Plan:  ?Thomas Harding is a 66 y.o. male . ?Numbness of foot ?Spinal stenosis at L4-L5 level ?Left foot drop ? - suspect some residual neuropathy form prior lumbar radiculopathy. Possible incomplete foot drop on  history with dorsiflexion weakness. Denies pain, avoid new meds for now. May be a candidate for AFO orthosis. Follow up with ortho recommended. ? ?Mixed hyperlipidemia - Plan: pravastatin (PRAVACHOL) 20 MG tablet, Comprehensive metabolic panel, Lipid panel ? -  Stable, tolerating current regimen. Medications refilled. Labs pending as above.  ? - borderline BP - monitor at home, rtc precautions given if elevated or d/w cardiology. Hold on med changes now.  ? ?Meds ordered this encounter  ?Medications  ? pravastatin (PRAVACHOL) 20 MG tablet  ?  Sig: Take 1 tablet (20 mg total) by mouth  daily.  ?  Dispense:  90 tablet  ?  Refill:  3  ? ?Patient Instructions  ?Lab only visit in next 2 weeks.  ?No change with pravastatin dose for now.  ?Keep follow up with cardiology for the cardiac studies.  ?Keep a record of your blood pressures outside of the office and if running higher than 130/80 consistently follow up to discuss further or discuss with cardiology. ? ?Foot numbness and nerve symptoms likely due to your back. Call Thomas Harding for appointment to evaluate this further and discuss the tripping as they may recommend using a brace.  ? ? ? ?Signed,  ? ?Merri Ray, MD ?[place] ?[place] ?02/20/22 ?12:34 PM ? ? ?

## 2022-02-20 NOTE — Patient Instructions (Addendum)
Lab only visit in next 2 weeks.  ?No change with pravastatin dose for now.  ?Keep follow up with cardiology for the cardiac studies.  ?Keep a record of your blood pressures outside of the office and if running higher than 130/80 consistently follow up to discuss further or discuss with cardiology. ? ?Foot numbness and nerve symptoms likely due to your back. Call Dr. Tonita Cong for appointment to evaluate this further and discuss the tripping as they may recommend using a brace.  ?

## 2022-02-25 ENCOUNTER — Other Ambulatory Visit (INDEPENDENT_AMBULATORY_CARE_PROVIDER_SITE_OTHER): Payer: Medicare Other

## 2022-02-25 DIAGNOSIS — E782 Mixed hyperlipidemia: Secondary | ICD-10-CM | POA: Diagnosis not present

## 2022-02-26 LAB — COMPREHENSIVE METABOLIC PANEL
ALT: 20 U/L (ref 0–53)
AST: 21 U/L (ref 0–37)
Albumin: 4 g/dL (ref 3.5–5.2)
Alkaline Phosphatase: 68 U/L (ref 39–117)
BUN: 14 mg/dL (ref 6–23)
CO2: 29 mEq/L (ref 19–32)
Calcium: 8.8 mg/dL (ref 8.4–10.5)
Chloride: 103 mEq/L (ref 96–112)
Creatinine, Ser: 1.21 mg/dL (ref 0.40–1.50)
GFR: 62.92 mL/min (ref >60.00)
Glucose, Bld: 99 mg/dL (ref 70–99)
Potassium: 4.6 mEq/L (ref 3.5–5.1)
Sodium: 139 mEq/L (ref 135–145)
Total Bilirubin: 0.7 mg/dL (ref 0.2–1.2)
Total Protein: 6.7 g/dL (ref 6.0–8.3)

## 2022-02-26 LAB — LDL CHOLESTEROL, DIRECT: Direct LDL: 95 mg/dL

## 2022-02-26 LAB — LIPID PANEL
Cholesterol: 172 mg/dL (ref 0–200)
HDL: 38 mg/dL — ABNORMAL LOW (ref >39.00)
NonHDL: 134.43
Total CHOL/HDL Ratio: 5
Triglycerides: 324 mg/dL — ABNORMAL HIGH (ref 0.0–149.0)
VLDL: 64.8 mg/dL — ABNORMAL HIGH (ref 0.0–40.0)

## 2022-03-14 ENCOUNTER — Ambulatory Visit
Admission: RE | Admit: 2022-03-14 | Discharge: 2022-03-14 | Disposition: A | Discharge status: Home / Self Care | Payer: BLUE CROSS/BLUE SHIELD | Source: Ambulatory Visit | Attending: Cardiology | Admitting: Cardiology

## 2022-03-14 DIAGNOSIS — I700 Atherosclerosis of aorta: Secondary | ICD-10-CM | POA: Diagnosis not present

## 2022-03-14 DIAGNOSIS — R0609 Other forms of dyspnea: Secondary | ICD-10-CM

## 2022-03-15 ENCOUNTER — Encounter: Payer: Self-pay | Admitting: Cardiology

## 2022-03-15 ENCOUNTER — Other Ambulatory Visit: Payer: Self-pay | Admitting: Cardiology

## 2022-03-15 DIAGNOSIS — R0609 Other forms of dyspnea: Secondary | ICD-10-CM

## 2022-03-15 DIAGNOSIS — R931 Abnormal findings on diagnostic imaging of heart and coronary circulation: Secondary | ICD-10-CM

## 2022-03-15 NOTE — Telephone Encounter (Signed)
From patient.

## 2022-04-08 ENCOUNTER — Ambulatory Visit: Payer: Medicare Other

## 2022-04-08 DIAGNOSIS — R0609 Other forms of dyspnea: Secondary | ICD-10-CM

## 2022-04-08 DIAGNOSIS — R931 Abnormal findings on diagnostic imaging of heart and coronary circulation: Secondary | ICD-10-CM

## 2022-04-25 DIAGNOSIS — R931 Abnormal findings on diagnostic imaging of heart and coronary circulation: Secondary | ICD-10-CM | POA: Insufficient documentation

## 2022-04-25 DIAGNOSIS — R9439 Abnormal result of other cardiovascular function study: Secondary | ICD-10-CM | POA: Insufficient documentation

## 2022-04-25 NOTE — Progress Notes (Signed)
Patient referred by [name], MD for bradycardia  Subjective:   Thomas Harding, male    DOB: [birth year], 66 y.o.   MRN: [id number]   Chief Complaint  Patient presents with   PVC   Follow-up    3 month   Results    Stress test     HPI  66 y.o. Caucasian male with PVC, exertional dyspnea, atypical chest pain, elevated calcium score, abnormal stress test  Patient continues to have episodes of decreased exercise tolerance and the degree of exertional dyspnea.  He is also noticed chest tightness, but usually at rest.  He also feels occasional flutter sensation.  He gets tired and fatigued by 3-4 in the afternoon.  Does not endorse snoring, but mentions that people have noticed that he quits breathing when he is asleep at times.  Reviewed recent test results with the patient, details below.    Initial consultation HPI 06/2020: Patient recently underwent back surgery.  Preoperatively, he was noticed to have frequent PVCs and bradycardia.  He was therefore referred for further evaluation.  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  He denies any exertional chest pain, overt dyspnea, presyncope, syncope.  He previously underwent echocardiogram exercise stress test 2017, both were unremarkable.   Current Outpatient Medications:    acetaminophen (TYLENOL) 325 MG tablet, Take 650 mg by mouth every 6 (six) hours as needed., Disp: , Rfl:    ALPHA LIPOIC ACID PO, Take 2 capsules by mouth daily., Disp: , Rfl:    metoprolol succinate (TOPROL XL) 25 MG 24 hr tablet, Take 1 tablet (25 mg total) by mouth daily., Disp: 90 tablet, Rfl: 3   Multiple Vitamin (MULTIVITAMIN) tablet, Take 1 tablet by mouth daily., Disp: , Rfl:    pravastatin (PRAVACHOL) 20 MG tablet, Take 1 tablet (20 mg total) by mouth daily., Disp: 90 tablet, Rfl: 3  Cardiovascular and other pertinent studies:  EKG 04/26/2022: Sinus rhythm 77 bpm Frequent ectopic ventricular beats    Exercise Tetrofosmin stress test 04/08/2022: Exercise nuclear stress test was performed using Bruce protocol. Patient reached 8 METS, and 98% of age predicted maximum heart rate. Exercise capacity was fair. No chest pain reported. Heart rate and hemodynamic response were normal. Stress EKG revealed no ischemic changes. Stress EKG showed sinus tachycardia, frequent PVC's, 1.5 mm horizontal ST depression in leads V4-V6 with normalization within 1 min to recovery, near resolution of frequent PVC's within 2 min into recovery. Stress EKG is equivocal for ischemia, but significant for exercise induced PVC's. SPECT images show small sized, mild intensity, reversible perfusion defect in basal inferoseptal myocardium. Mild global decrease in left ventricle wall motion and thickening. Stress LVEF 41%. LVEF estimation could be affected by frequent PVC's. Intermediate risk study.   CT cardiac scoring 03/14/2022: Left Main: 0 LAD: 54 LCx: 2.21 RCA: 13.1   Total Agatston Score: 69.3 MESA database percentile: 49  Lower Extremity Arterial Duplex 02/07/2022:  No hemodynamically significant stenoses are identified in the right lower  extremity arterial system. No hemodynamically significant stenoses are  identified in the left lower extremity arterial system.  This exam reveals normal perfusion of the right lower extremity (ABI 1.14)  and normal perfusion of the left lower extremity (ABI 1.09). Minimal  homogeneous plaque noted in bilateral lower extremity. Normal triphasic  waveform at the ankles.    EKG 01/31/2022: Sinus rhythm 57 bpm Low voltage in precordial leads   Mobile cardiac telemetry 7 days 06/26/2021 -  07/03/2021: Dominant rhythm: Sinus. HR 48-134 bpm. Avg HR 69 bpm. 2 episodes of probable atrial tachycardia, fastest at 116 bpm for 6 beats, longest for 31.6 secs at 91 bpm. <1% isolated SVE, couplet/triplets. 0 episodes of VT 1.2% isolated VE, <1 couplet/triplets. No atrial  fibrillation/atrial flutter/VT/high grade AV block, sinus pause >3sec noted. 0 patient triggered events.   Echocardiogram 06/26/2021:  Left ventricle cavity is normal in size and wall thickness. Normal global  wall motion. Normal LV systolic function with EF 60%. Normal diastolic  filling pattern.  No significant valvular abnormality.  No evidence of pulmonary hypertension.  No significant change compared to previous study in 2017.  Recent labs: 02/25/2022: Glucose 99, BUN/Cr 14/1.21. EGFR 62. Na/K 139/4.6. Rest of the CMP normal CBC NA HbA1C NA Chol 172, TG 324, HDL 38, dLDL 95 TSH NA  02/15/2021: Glucose 111, BUN/Cr 20/1.23. EGFR 62. Na/K 140/4.2. Rest of the CMP normal H/H 13/40. MCV 87. Platelets 192 Chol 179, TG 139, HDL 39, LDL 102   Review of Systems  Cardiovascular:  Positive for chest pain, dyspnea on exertion and palpitations. Negative for leg swelling and syncope.         Vitals:   04/26/22 0829  BP: (!) 136/59  Pulse: (!) 44  Resp: 16  Temp: 98 F (36.7 C)  SpO2: 97%     Body mass index is 32.08 kg/m. Filed Weights   04/26/22 0829  Weight: 230 lb (104.3 kg)     Objective:   Physical Exam Vitals and nursing note reviewed.  Constitutional:      General: He is not in acute distress. Neck:     Vascular: No JVD.  Cardiovascular:     Rate and Rhythm: Normal rate and regular rhythm. Frequent Extrasystoles are present.    Heart sounds: Normal heart sounds. No murmur heard. Pulmonary:     Effort: Pulmonary effort is normal.     Breath sounds: Normal breath sounds. No wheezing or rales.  Musculoskeletal:     Right lower leg: No edema.     Left lower leg: No edema.          Assessment & Recommendations:    66 y.o. Caucasian male with PVC, exertional dyspnea, atypical chest pain, elevated calcium score, abnormal stress test  Exertional dyspnea, atypical chest pain: Symptoms are nonspecific, but patient states "I just do not feel right".  I am  not convinced that all of the symptoms are stemming from a possible obstructive coronary artery disease, but this certainly needs to be ruled out-given elevated calcium score, elevated triglycerides, abnormal stress test. In addition to ongoing metoprolol, added amlodipine 5 mg as other than antianginal lesion. Recommend aspirin 81 mg daily for now, change pravastatin 20 mg daily to Crestor 20 mg daily. We will plan for coronary angiography, and possible intervention in the next few weeks. If that is excluded, I will repeat his cardiac telemetry to reassess PVC burden.  With increasing PVC's, prior documentation of atrial tachycardia, history of snoring, I recommend sleep study.  Claudication: Normal LE [place]. Likely pseudoclaudication.  F/u after cath   Nigel Mormon, MD Pager: [phone] Office: [phone]

## 2022-04-26 ENCOUNTER — Encounter: Payer: Self-pay | Admitting: Cardiology

## 2022-04-26 ENCOUNTER — Ambulatory Visit: Payer: Medicare Other | Admitting: Cardiology

## 2022-04-26 VITALS — BP 136/59 | HR 44 | Temp 98.0°F | Resp 16 | Ht 71.0 in | Wt 230.0 lb

## 2022-04-26 DIAGNOSIS — R0609 Other forms of dyspnea: Secondary | ICD-10-CM

## 2022-04-26 DIAGNOSIS — I493 Ventricular premature depolarization: Secondary | ICD-10-CM | POA: Diagnosis not present

## 2022-04-26 DIAGNOSIS — R931 Abnormal findings on diagnostic imaging of heart and coronary circulation: Secondary | ICD-10-CM

## 2022-04-26 DIAGNOSIS — I471 Supraventricular tachycardia: Secondary | ICD-10-CM | POA: Diagnosis not present

## 2022-04-26 DIAGNOSIS — G4730 Sleep apnea, unspecified: Secondary | ICD-10-CM | POA: Insufficient documentation

## 2022-04-26 DIAGNOSIS — Z6832 Body mass index (BMI) 32.0-32.9, adult: Secondary | ICD-10-CM | POA: Insufficient documentation

## 2022-04-26 DIAGNOSIS — E669 Obesity, unspecified: Secondary | ICD-10-CM | POA: Insufficient documentation

## 2022-04-26 DIAGNOSIS — R5383 Other fatigue: Secondary | ICD-10-CM | POA: Insufficient documentation

## 2022-04-26 DIAGNOSIS — Z6831 Body mass index (BMI) 31.0-31.9, adult: Secondary | ICD-10-CM | POA: Insufficient documentation

## 2022-04-26 DIAGNOSIS — R9439 Abnormal result of other cardiovascular function study: Secondary | ICD-10-CM | POA: Diagnosis not present

## 2022-04-26 DIAGNOSIS — R0681 Apnea, not elsewhere classified: Secondary | ICD-10-CM | POA: Diagnosis not present

## 2022-04-26 DIAGNOSIS — R0789 Other chest pain: Secondary | ICD-10-CM | POA: Insufficient documentation

## 2022-04-26 MED ORDER — ASPIRIN 81 MG PO TBEC: 81.0000 mg | DELAYED_RELEASE_TABLET | Freq: Every day | ORAL | 3 refills | Status: DC

## 2022-04-26 MED ORDER — ROSUVASTATIN CALCIUM 20 MG PO TABS: 20.0000 mg | ORAL_TABLET | Freq: Every day | ORAL | 3 refills | Status: DC

## 2022-04-26 MED ORDER — AMLODIPINE BESYLATE 5 MG PO TABS: 5.0000 mg | ORAL_TABLET | Freq: Every day | ORAL | 3 refills | Status: DC

## 2022-05-02 ENCOUNTER — Ambulatory Visit: Payer: Medicare Other | Admitting: Cardiology

## 2022-05-09 DIAGNOSIS — R931 Abnormal findings on diagnostic imaging of heart and coronary circulation: Secondary | ICD-10-CM | POA: Diagnosis not present

## 2022-05-09 DIAGNOSIS — R0789 Other chest pain: Secondary | ICD-10-CM | POA: Diagnosis not present

## 2022-05-09 DIAGNOSIS — R0609 Other forms of dyspnea: Secondary | ICD-10-CM | POA: Diagnosis not present

## 2022-05-10 LAB — CBC
Hematocrit: 44.1 % (ref 37.5–51.0)
Hemoglobin: 15.3 g/dL (ref 13.0–17.7)
MCH: 30 pg (ref 26.6–33.0)
MCHC: 34.7 g/dL (ref 31.5–35.7)
MCV: 87 fL (ref 79–97)
Platelets: 196 10*3/uL (ref 150–450)
RBC: 5.1 x10E6/uL (ref 4.14–5.80)
RDW: 12.4 % (ref 11.6–15.4)
WBC: 7.5 10*3/uL (ref 3.4–10.8)

## 2022-05-10 LAB — LIPID PANEL
Chol/HDL Ratio: 2.7 ratio (ref 0.0–5.0)
Cholesterol, Total: 106 mg/dL (ref 100–199)
HDL: 40 mg/dL (ref >39)
LDL Chol Calc (NIH): 46 mg/dL (ref 0–99)
Triglycerides: 111 mg/dL (ref 0–149)
VLDL Cholesterol Cal: 20 mg/dL (ref 5–40)

## 2022-05-14 ENCOUNTER — Encounter (HOSPITAL_COMMUNITY)
Admission: RE | Disposition: A | Discharge status: Home / Self Care | Payer: Self-pay | Source: Home / Self Care | Attending: Cardiology

## 2022-05-14 ENCOUNTER — Other Ambulatory Visit: Payer: Self-pay

## 2022-05-14 ENCOUNTER — Other Ambulatory Visit: Payer: Self-pay | Admitting: Cardiology

## 2022-05-14 ENCOUNTER — Ambulatory Visit (HOSPITAL_COMMUNITY)
Admission: RE | Admit: 2022-05-14 | Discharge: 2022-05-14 | Disposition: A | Discharge status: Home / Self Care | Payer: Medicare Other | Attending: Cardiology | Admitting: Cardiology

## 2022-05-14 DIAGNOSIS — R0789 Other chest pain: Secondary | ICD-10-CM | POA: Insufficient documentation

## 2022-05-14 DIAGNOSIS — R9439 Abnormal result of other cardiovascular function study: Secondary | ICD-10-CM | POA: Insufficient documentation

## 2022-05-14 DIAGNOSIS — R0609 Other forms of dyspnea: Secondary | ICD-10-CM | POA: Diagnosis not present

## 2022-05-14 DIAGNOSIS — I493 Ventricular premature depolarization: Secondary | ICD-10-CM | POA: Diagnosis not present

## 2022-05-14 HISTORY — PX: LEFT HEART CATH AND CORONARY ANGIOGRAPHY: CATH118249

## 2022-05-14 LAB — BASIC METABOLIC PANEL
Anion gap: 7 (ref 5–15)
BUN: 14 mg/dL (ref 8–23)
CO2: 28 mmol/L (ref 22–32)
Calcium: 9.6 mg/dL (ref 8.9–10.3)
Chloride: 104 mmol/L (ref 98–111)
Creatinine, Ser: 1.17 mg/dL (ref 0.61–1.24)
GFR, Estimated: >60 mL/min (ref >60)
Glucose, Bld: 100 mg/dL — ABNORMAL HIGH (ref 70–99)
Potassium: 4.3 mmol/L (ref 3.5–5.1)
Sodium: 139 mmol/L (ref 135–145)

## 2022-05-14 SURGERY — LEFT HEART CATH AND CORONARY ANGIOGRAPHY
Anesthesia: LOCAL

## 2022-05-14 MED ORDER — MIDAZOLAM HCL 2 MG/2ML IJ SOLN
INTRAMUSCULAR | Status: AC
  Filled 2022-05-14: qty 2

## 2022-05-14 MED ORDER — SODIUM CHLORIDE 0.9% FLUSH: 3.0000 mL | Freq: Two times a day (BID) | INTRAVENOUS | Status: DC

## 2022-05-14 MED ORDER — SODIUM CHLORIDE 0.9 % IV SOLN
250.0000 mL | INTRAVENOUS | Status: DC | PRN
Start: 2022-05-14 — End: 2022-05-14

## 2022-05-14 MED ORDER — ASPIRIN 81 MG PO CHEW: 81.0000 mg | CHEWABLE_TABLET | ORAL | Status: DC

## 2022-05-14 MED ORDER — ACETAMINOPHEN 325 MG PO TABS: 650.0000 mg | ORAL_TABLET | ORAL | Status: DC | PRN

## 2022-05-14 MED ORDER — HEPARIN (PORCINE) IN NACL 1000-0.9 UT/500ML-% IV SOLN
INTRAVENOUS | Status: DC | PRN
  Administered 2022-05-14 (×2): 500 mL

## 2022-05-14 MED ORDER — ONDANSETRON HCL 4 MG/2ML IJ SOLN: 4.0000 mg | Freq: Four times a day (QID) | INTRAMUSCULAR | Status: DC | PRN

## 2022-05-14 MED ORDER — LIDOCAINE HCL (PF) 1 % IJ SOLN
INTRAMUSCULAR | Status: AC
  Filled 2022-05-14: qty 30

## 2022-05-14 MED ORDER — LIDOCAINE HCL (PF) 1 % IJ SOLN
INTRAMUSCULAR | Status: DC | PRN
  Administered 2022-05-14: 5 mL via INTRADERMAL

## 2022-05-14 MED ORDER — NITROGLYCERIN 1 MG/10 ML FOR IR/CATH LAB
INTRA_ARTERIAL | Status: DC | PRN
  Administered 2022-05-14: 200 ug via INTRACORONARY
  Administered 2022-05-14: 100 ug via INTRACORONARY

## 2022-05-14 MED ORDER — FENTANYL CITRATE (PF) 100 MCG/2ML IJ SOLN
INTRAMUSCULAR | Status: AC
  Filled 2022-05-14: qty 2

## 2022-05-14 MED ORDER — VERAPAMIL HCL 2.5 MG/ML IV SOLN
INTRAVENOUS | Status: AC
  Filled 2022-05-14: qty 2

## 2022-05-14 MED ORDER — MIDAZOLAM HCL 2 MG/2ML IJ SOLN
INTRAMUSCULAR | Status: DC | PRN
  Administered 2022-05-14: 1 mg via INTRAVENOUS

## 2022-05-14 MED ORDER — IOHEXOL 350 MG/ML SOLN
INTRAVENOUS | Status: DC | PRN
  Administered 2022-05-14: 40 mL

## 2022-05-14 MED ORDER — LABETALOL HCL 5 MG/ML IV SOLN: 10.0000 mg | INTRAVENOUS | Status: DC | PRN

## 2022-05-14 MED ORDER — HEPARIN (PORCINE) IN NACL 1000-0.9 UT/500ML-% IV SOLN
INTRAVENOUS | Status: AC
  Filled 2022-05-14: qty 1000

## 2022-05-14 MED ORDER — NITROGLYCERIN 1 MG/10 ML FOR IR/CATH LAB
INTRA_ARTERIAL | Status: AC
  Filled 2022-05-14: qty 10

## 2022-05-14 MED ORDER — HYDRALAZINE HCL 20 MG/ML IJ SOLN: 10.0000 mg | INTRAMUSCULAR | Status: DC | PRN

## 2022-05-14 MED ORDER — SODIUM CHLORIDE 0.9 % WEIGHT BASED INFUSION
3.0000 mL/kg/h | INTRAVENOUS | Status: AC
  Administered 2022-05-14: 3 mL/kg/h via INTRAVENOUS

## 2022-05-14 MED ORDER — SODIUM CHLORIDE 0.9 % IV SOLN: 250.0000 mL | INTRAVENOUS | Status: DC | PRN

## 2022-05-14 MED ORDER — SODIUM CHLORIDE 0.9 % WEIGHT BASED INFUSION: 1.0000 mL/kg/h | INTRAVENOUS | Status: DC

## 2022-05-14 MED ORDER — VERAPAMIL HCL 2.5 MG/ML IV SOLN
INTRAVENOUS | Status: DC | PRN
  Administered 2022-05-14: 10 mL via INTRA_ARTERIAL

## 2022-05-14 MED ORDER — FENTANYL CITRATE (PF) 100 MCG/2ML IJ SOLN
INTRAMUSCULAR | Status: DC | PRN
  Administered 2022-05-14: 50 ug via INTRAVENOUS

## 2022-05-14 MED ORDER — HEPARIN SODIUM (PORCINE) 1000 UNIT/ML IJ SOLN
INTRAMUSCULAR | Status: DC | PRN
  Administered 2022-05-14: 2000 [IU] via INTRAVENOUS
  Administered 2022-05-14: 3000 [IU] via INTRAVENOUS

## 2022-05-14 MED ORDER — SODIUM CHLORIDE 0.9 % IV SOLN: INTRAVENOUS | Status: DC

## 2022-05-14 MED ORDER — SODIUM CHLORIDE 0.9% FLUSH: 3.0000 mL | INTRAVENOUS | Status: DC | PRN

## 2022-05-14 MED ORDER — HEPARIN SODIUM (PORCINE) 1000 UNIT/ML IJ SOLN
INTRAMUSCULAR | Status: AC
  Filled 2022-05-14: qty 10

## 2022-05-14 SURGICAL SUPPLY — 11 items
BAND ZEPHYR COMPRESS 30 LONG (HEMOSTASIS) ×1 IMPLANT
CATH INFINITI JR4 5F (CATHETERS) ×1 IMPLANT
CATH OPTITORQUE TIG 4.0 5F (CATHETERS) ×1 IMPLANT
GLIDESHEATH SLEND A-KIT 6F 22G (SHEATH) ×1 IMPLANT
GUIDEWIRE INQWIRE 1.5J.035X260 (WIRE) IMPLANT
INQWIRE 1.5J .035X260CM (WIRE) ×4
KIT HEART LEFT (KITS) ×2 IMPLANT
PACK CARDIAC CATHETERIZATION (CUSTOM PROCEDURE TRAY) ×2 IMPLANT
TRANSDUCER W/STOPCOCK (MISCELLANEOUS) ×2 IMPLANT
TUBING CIL FLEX 10 FLL-RA (TUBING) ×2 IMPLANT
WIRE HI TORQ VERSACORE-J 145CM (WIRE) ×1 IMPLANT

## 2022-05-14 NOTE — Discharge Instructions (Signed)

## 2022-05-14 NOTE — Interval H&P Note (Signed)
History and Physical Interval Note:  05/14/2022 12:39 PM  Thomas Harding  has presented today for surgery, with the diagnosis of chest pain, abnormal stress test.  The various methods of treatment have been discussed with the patient and family. After consideration of risks, benefits and other options for treatment, the patient has consented to  Procedure(s): LEFT HEART CATH AND CORONARY ANGIOGRAPHY (N/A) as a surgical intervention.  The patient's history has been reviewed, patient examined, no change in status, stable for surgery.  I have reviewed the patient's chart and labs.  Questions were answered to the patient's satisfaction.    Intermediate risk non-invasive test One anti anginal agent  Kristopher Delk J [name]

## 2022-05-14 NOTE — H&P (Signed)
OV 04/26/2022 copied for documentation     Patient referred by [name], MD for bradycardia  Subjective:   Thomas Harding, male    DOB: [birth year], 66 y.o.   MRN: [id number]   Chief Complaint  Patient presents with   PVC   Follow-up    3 month   Results    Stress test     HPI  66 y.o. Caucasian male with PVC, exertional dyspnea, atypical chest pain, elevated calcium score, abnormal stress test  Patient continues to have episodes of decreased exercise tolerance and the degree of exertional dyspnea.  He is also noticed chest tightness, but usually at rest.  He also feels occasional flutter sensation.  He gets tired and fatigued by 3-4 in the afternoon.  Does not endorse snoring, but mentions that people have noticed that he quits breathing when he is asleep at times.  Reviewed recent test results with the patient, details below.    Initial consultation HPI 06/2020: Patient recently underwent back surgery.  Preoperatively, he was noticed to have frequent PVCs and bradycardia.  He was therefore referred for further evaluation.  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  He denies any exertional chest pain, overt dyspnea, presyncope, syncope.  He previously underwent echocardiogram exercise stress test 2017, both were unremarkable.   Current Outpatient Medications:    acetaminophen (TYLENOL) 325 MG tablet, Take 650 mg by mouth every 6 (six) hours as needed., Disp: , Rfl:    ALPHA LIPOIC ACID PO, Take 2 capsules by mouth daily., Disp: , Rfl:    metoprolol succinate (TOPROL XL) 25 MG 24 hr tablet, Take 1 tablet (25 mg total) by mouth daily., Disp: 90 tablet, Rfl: 3   Multiple Vitamin (MULTIVITAMIN) tablet, Take 1 tablet by mouth daily., Disp: , Rfl:    pravastatin (PRAVACHOL) 20 MG tablet, Take 1 tablet (20 mg total) by mouth daily., Disp: 90 tablet, Rfl: 3  Cardiovascular and other pertinent studies:  EKG 04/26/2022: Sinus rhythm 77  bpm Frequent ectopic ventricular beats   Exercise Tetrofosmin stress test 04/08/2022: Exercise nuclear stress test was performed using Bruce protocol. Patient reached 8 METS, and 98% of age predicted maximum heart rate. Exercise capacity was fair. No chest pain reported. Heart rate and hemodynamic response were normal. Stress EKG revealed no ischemic changes. Stress EKG showed sinus tachycardia, frequent PVC's, 1.5 mm horizontal ST depression in leads V4-V6 with normalization within 1 min to recovery, near resolution of frequent PVC's within 2 min into recovery. Stress EKG is equivocal for ischemia, but significant for exercise induced PVC's. SPECT images show small sized, mild intensity, reversible perfusion defect in basal inferoseptal myocardium. Mild global decrease in left ventricle wall motion and thickening. Stress LVEF 41%. LVEF estimation could be affected by frequent PVC's. Intermediate risk study.   CT cardiac scoring 03/14/2022: Left Main: 0 LAD: 54 LCx: 2.21 RCA: 13.1   Total Agatston Score: 69.3 MESA database percentile: 49  Lower Extremity Arterial Duplex 02/07/2022:  No hemodynamically significant stenoses are identified in the right lower  extremity arterial system. No hemodynamically significant stenoses are  identified in the left lower extremity arterial system.  This exam reveals normal perfusion of the right lower extremity (ABI 1.14)  and normal perfusion of the left lower extremity (ABI 1.09). Minimal  homogeneous plaque noted in bilateral lower extremity. Normal triphasic  waveform at the ankles.    EKG 01/31/2022: Sinus rhythm 57 bpm Low voltage in precordial leads  Mobile cardiac telemetry 7 days 06/26/2021 - 07/03/2021: Dominant rhythm: Sinus. HR 48-134 bpm. Avg HR 69 bpm. 2 episodes of probable atrial tachycardia, fastest at 116 bpm for 6 beats, longest for 31.6 secs at 91 bpm. <1% isolated SVE, couplet/triplets. 0 episodes of VT 1.2% isolated VE, <1  couplet/triplets. No atrial fibrillation/atrial flutter/VT/high grade AV block, sinus pause >3sec noted. 0 patient triggered events.   Echocardiogram 06/26/2021:  Left ventricle cavity is normal in size and wall thickness. Normal global  wall motion. Normal LV systolic function with EF 60%. Normal diastolic  filling pattern.  No significant valvular abnormality.  No evidence of pulmonary hypertension.  No significant change compared to previous study in 2017.  Recent labs: 02/25/2022: Glucose 99, BUN/Cr 14/1.21. EGFR 62. Na/K 139/4.6. Rest of the CMP normal CBC NA HbA1C NA Chol 172, TG 324, HDL 38, dLDL 95 TSH NA  02/15/2021: Glucose 111, BUN/Cr 20/1.23. EGFR 62. Na/K 140/4.2. Rest of the CMP normal H/H 13/40. MCV 87. Platelets 192 Chol 179, TG 139, HDL 39, LDL 102   Review of Systems  Cardiovascular:  Positive for chest pain, dyspnea on exertion and palpitations. Negative for leg swelling and syncope.         Vitals:   04/26/22 0829  BP: (!) 136/59  Pulse: (!) 44  Resp: 16  Temp: 98 F (36.7 C)  SpO2: 97%     Body mass index is 32.08 kg/m. Filed Weights   04/26/22 0829  Weight: 230 lb (104.3 kg)     Objective:   Physical Exam Vitals and nursing note reviewed.  Constitutional:      General: He is not in acute distress. Neck:     Vascular: No JVD.  Cardiovascular:     Rate and Rhythm: Normal rate and regular rhythm. Frequent Extrasystoles are present.    Heart sounds: Normal heart sounds. No murmur heard. Pulmonary:     Effort: Pulmonary effort is normal.     Breath sounds: Normal breath sounds. No wheezing or rales.  Musculoskeletal:     Right lower leg: No edema.     Left lower leg: No edema.          Assessment & Recommendations:    66 y.o. Caucasian male with PVC, exertional dyspnea, atypical chest pain, elevated calcium score, abnormal stress test  Exertional dyspnea, atypical chest pain: Symptoms are nonspecific, but patient states "I  just do not feel right".  I am not convinced that all of the symptoms are stemming from a possible obstructive coronary artery disease, but this certainly needs to be ruled out-given elevated calcium score, elevated triglycerides, abnormal stress test. In addition to ongoing metoprolol, added amlodipine 5 mg as other than antianginal lesion. Recommend aspirin 81 mg daily for now, change pravastatin 20 mg daily to Crestor 20 mg daily. We will plan for coronary angiography, and possible intervention in the next few weeks. If that is excluded, I will repeat his cardiac telemetry to reassess PVC burden.  With increasing PVC's, prior documentation of atrial tachycardia, history of snoring, I recommend sleep study.  Claudication: Normal LE [place]. Likely pseudoclaudication.  F/u after cath   Nigel Mormon, MD Pager: [phone] Office: [phone]

## 2022-05-15 ENCOUNTER — Encounter (HOSPITAL_COMMUNITY): Payer: Self-pay | Admitting: Cardiology

## 2022-05-15 ENCOUNTER — Ambulatory Visit: Payer: Medicare Other

## 2022-05-15 DIAGNOSIS — I493 Ventricular premature depolarization: Secondary | ICD-10-CM

## 2022-05-29 ENCOUNTER — Ambulatory Visit: Payer: Medicare Other | Admitting: Cardiology

## 2022-05-29 ENCOUNTER — Encounter: Payer: Self-pay | Admitting: Cardiology

## 2022-05-29 VITALS — BP 138/67 | HR 51 | Temp 98.1°F | Resp 16 | Ht 71.0 in | Wt 231.0 lb

## 2022-05-29 DIAGNOSIS — R931 Abnormal findings on diagnostic imaging of heart and coronary circulation: Secondary | ICD-10-CM

## 2022-05-29 DIAGNOSIS — I493 Ventricular premature depolarization: Secondary | ICD-10-CM

## 2022-05-29 NOTE — Progress Notes (Addendum)
Patient referred by [name], MD for bradycardia  Subjective:   Thomas Harding, male    DOB: August 08, 1956, 66 y.o.   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  Patient recently underwent coronary angiogram due to abnormal stress test and continued symptoms of chest pain or shortness of breath.  Coronary angiogram did not show any significant disease.  PVCs continue.  Patient is here for follow-up today.  Patient recently for cardiac telemetry monitor to assess PVC burden.  Report not available to me yet.  Initial consultation HPI 06/2020: Patient recently underwent back surgery.  Preoperatively, he was noticed to have frequent PVCs and bradycardia.  He was therefore referred for further evaluation.  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  He denies any exertional chest pain, overt dyspnea, presyncope, syncope.  He previously underwent echocardiogram exercise stress test 2017, both were unremarkable.   Current Outpatient Medications:    acetaminophen (TYLENOL) 325 MG tablet, Take 650 mg by mouth every 6 (six) hours as needed for moderate pain., Disp: , Rfl:    amLODipine (NORVASC) 5 MG tablet, Take 1 tablet (5 mg total) by mouth daily., Disp: 30 tablet, Rfl: 3   metoprolol succinate (TOPROL XL) 25 MG 24 hr tablet, Take 1 tablet (25 mg total) by mouth daily., Disp: 90 tablet, Rfl: 3   rosuvastatin (CRESTOR) 20 MG tablet, Take 1 tablet (20 mg total) by mouth daily., Disp: 30 tablet, Rfl: 3  Cardiovascular and other pertinent studies:  Coronary angiography 05/14/2022: Right dominant circulation No angiographic evidence of coronary artery disease Slightly sluggish flow likely due to frequent PVCs/ventricular bigeminy Will consider EP referral   EKG 04/26/2022: Sinus rhythm 77 bpm Frequent ectopic ventricular beats   Exercise  Tetrofosmin stress test 04/08/2022: Exercise nuclear stress test was performed using Bruce protocol. Patient reached 8 METS, and 98% of age predicted maximum heart rate. Exercise capacity was fair. No chest pain reported. Heart rate and hemodynamic response were normal. Stress EKG revealed no ischemic changes. Stress EKG showed sinus tachycardia, frequent PVC's, 1.5 mm horizontal ST depression in leads V4-V6 with normalization within 1 min to recovery, near resolution of frequent PVC's within 2 min into recovery. Stress EKG is equivocal for ischemia, but significant for exercise induced PVC's. SPECT images show small sized, mild intensity, reversible perfusion defect in basal inferoseptal myocardium. Mild global decrease in left ventricle wall motion and thickening. Stress LVEF 41%. LVEF estimation could be affected by frequent PVC's. Intermediate risk study.   CT cardiac scoring 03/14/2022: Left Main: 0 LAD: 54 LCx: 2.21 RCA: 13.1   Total Agatston Score: 69.3 MESA database percentile: 49  Lower Extremity Arterial Duplex 02/07/2022:  No hemodynamically significant stenoses are identified in the right lower  extremity arterial system. No hemodynamically significant stenoses are  identified in the left lower extremity arterial system.  This exam reveals normal perfusion of the right lower extremity (ABI 1.14)  and normal perfusion of the left lower extremity (ABI 1.09). Minimal  homogeneous plaque noted in bilateral lower extremity. Normal triphasic  waveform at the ankles.    EKG 01/31/2022: Sinus rhythm 57 bpm Low voltage in precordial leads   Mobile cardiac telemetry 7 days 06/26/2021 - 07/03/2021: Dominant rhythm: Sinus. HR 48-134 bpm. Avg HR 69 bpm. 2 episodes of probable atrial  tachycardia, fastest at 116 bpm for 6 beats, longest for 31.6 secs at 91 bpm. <1% isolated SVE, couplet/triplets. 0 episodes of VT 1.2% isolated VE, <1 couplet/triplets. No atrial fibrillation/atrial  flutter/VT/high grade AV block, sinus pause >3sec noted. 0 patient triggered events.   Echocardiogram 06/26/2021:  Left ventricle cavity is normal in size and wall thickness. Normal global  wall motion. Normal LV systolic function with EF 60%. Normal diastolic  filling pattern.  No significant valvular abnormality.  No evidence of pulmonary hypertension.  No significant change compared to previous study in 2017.  Recent labs: 02/25/2022: Glucose 99, BUN/Cr 14/1.21. EGFR 62. Na/K 139/4.6. Rest of the CMP normal CBC NA HbA1C NA Chol 172, TG 324, HDL 38, dLDL 95 TSH NA  02/15/2021: Glucose 111, BUN/Cr 20/1.23. EGFR 62. Na/K 140/4.2. Rest of the CMP normal H/H 13/40. MCV 87. Platelets 192 Chol 179, TG 139, HDL 39, LDL 102   Review of Systems  Cardiovascular:  Positive for chest pain, dyspnea on exertion and palpitations. Negative for leg swelling and syncope.         Vitals:   05/29/22 0938  BP: 138/67  Pulse: (!) 51  Resp: 16  Temp: 98.1 F (36.7 C)  SpO2: 96%     Body mass index is 65.22 kg/m. Filed Weights   05/29/22 0938  Weight: 231 lb (104.8 kg)     Objective:   Physical Exam Vitals and nursing note reviewed.  Constitutional:      General: He is not in acute distress. Neck:     Vascular: No JVD.  Cardiovascular:     Rate and Rhythm: Normal rate and regular rhythm. Frequent Extrasystoles are present.    Heart sounds: Normal heart sounds. No murmur heard. Pulmonary:     Effort: Pulmonary effort is normal.     Breath sounds: Normal breath sounds. No wheezing or rales.  Musculoskeletal:     Right lower leg: No edema.     Left lower leg: No edema.          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  Symptomatic PVCs: Continue metoprolol succinate 25 mg daily.  Awaiting telemetry report.  If PVC burden remains >10%, medical consider referral to EP.  F/u in 3 months   Nigel Mormon, MD Pager:  [phone] Office: [phone]

## 2022-05-30 DIAGNOSIS — I493 Ventricular premature depolarization: Secondary | ICD-10-CM | POA: Diagnosis not present

## 2022-05-31 ENCOUNTER — Encounter: Payer: Self-pay | Admitting: Cardiology

## 2022-06-04 ENCOUNTER — Encounter: Payer: Self-pay | Admitting: Neurology

## 2022-06-04 ENCOUNTER — Ambulatory Visit (INDEPENDENT_AMBULATORY_CARE_PROVIDER_SITE_OTHER): Payer: Medicare Other | Admitting: Neurology

## 2022-06-04 VITALS — BP 129/68 | HR 58 | Ht 71.0 in | Wt 231.0 lb

## 2022-06-04 DIAGNOSIS — G478 Other sleep disorders: Secondary | ICD-10-CM | POA: Diagnosis not present

## 2022-06-04 DIAGNOSIS — R519 Headache, unspecified: Secondary | ICD-10-CM | POA: Diagnosis not present

## 2022-06-04 DIAGNOSIS — Z9189 Other specified personal risk factors, not elsewhere classified: Secondary | ICD-10-CM | POA: Diagnosis not present

## 2022-06-04 DIAGNOSIS — R0681 Apnea, not elsewhere classified: Secondary | ICD-10-CM

## 2022-06-04 DIAGNOSIS — I471 Supraventricular tachycardia: Secondary | ICD-10-CM

## 2022-06-04 DIAGNOSIS — R0789 Other chest pain: Secondary | ICD-10-CM

## 2022-06-04 DIAGNOSIS — E669 Obesity, unspecified: Secondary | ICD-10-CM

## 2022-06-04 NOTE — Patient Instructions (Signed)

## 2022-06-04 NOTE — Progress Notes (Signed)
Subjective:    Patient ID: Thomas Harding is a 66 y.o. male.  HPI    Thomas Age, MD, PhD [facility] [address], [address] [place], [place] [address]  Dear Dr. Virgina Harding,   I saw your patient, Thomas Harding, upon your kind request in my sleep clinic today for initial consultation of his sleep disorder, in particular, concern for underlying obstructive sleep apnea.  The patient is unaccompanied today.  As you know, [name] is a 66 year old right-handed gentleman with an underlying medical history of hyperlipidemia, PVCs, bradycardia, history of paroxysmal atrial tachycardia, atypical chest pain and mild obesity, who reports some daytime tiredness, nonrestorative sleep and witnessed apneas.  I reviewed your office note from 04/26/2022.  His Epworth sleepiness score is 7 out of 24, fatigue severity score is 26 out of 63.  He is not sure if he snores, his wife has not complained about it.  He has rarely had a morning headache, does not wake up fully rested.  He has nocturia every other night, usually only once.  He drinks caffeine in moderation, 1 cup of coffee in the morning, 1 or 2 bottles of green tea, 1 iced tea with dinner.  He drinks soda occasionally, he drinks alcohol rarely, maybe once or twice a month, he is a non-smoker.  He lives with his wife, he has 2 grown children, 1 dog in the household.  He has a TV in the bedroom and typically is on at night, helps him fall asleep he believes.  He has a 30-minute timer on it.  He had a tonsillectomy as a child.  Weight has been more or less stable within 5 pound difference, overall gained about 5 pounds in the last 3 years.  Bedtime is generally between midnight and 12:30 AM but he admits that he often already has slept on the couch for an hour and a half or so.  Rise time is around 7:30 AM.  He does wake up with his wife at 24 or 6 AM but goes back to bed.  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  On a recent golf trip, his friend had mentioned long apneic pauses to him.  He is not aware of any family history of sleep apnea.  His Past Medical History Is Significant For: Past Medical History:  Diagnosis Date   Hyperlipidemia    Squamous cell carcinoma of skin 01/16/2017   in situ- right crown     His Past Surgical History Is Significant For: Past Surgical History:  Procedure Laterality Date   LEFT HEART CATH AND CORONARY ANGIOGRAPHY N/A 05/14/2022   Procedure: LEFT HEART CATH AND CORONARY ANGIOGRAPHY;  Surgeon: Nigel Mormon, MD;  Location: [facility];  Service: Cardiovascular;  Laterality: N/A;   LUMBAR LAMINECTOMY/DECOMPRESSION MICRODISCECTOMY Left 05/18/2020   Procedure: Microlumbar decompression Lumbar Four- Lumbar Five, Lumbar Five- Sacral One left;  Surgeon: Susa Day, MD;  Location: [facility];  Service: Orthopedics;  Laterality: Left;  Microlumbar decompression Lumbar Five- Sacral One left   ROTATOR CUFF REPAIR Right    thumb surgery Left    TONSILLECTOMY     VASECTOMY      His Family History Is Significant For: Family History  Problem Relation Harding of Onset   Hypertension Mother    Hyperlipidemia Father    Sleep apnea Neg Hx     His Social History Is Significant For:  Social History   Socioeconomic History   Marital status: Married    Spouse name: Not on file   Number of children: 2   Years of education: Not on file   Highest education level: Not on file  Occupational History   Not on file  Tobacco Use   Smoking status: Never   Smokeless tobacco: Never  Vaping Use   Vaping Use: Never used  Substance and Sexual Activity   Alcohol use: Yes    Comment: occ   Drug use: No   Sexual activity: Yes  Other Topics Concern   Not on file  Social History Narrative   Not on file   Social Determinants of Health   Financial Resource Strain: Not on file  Food Insecurity: Not on file  Transportation  Needs: Not on file  Physical Activity: Not on file  Stress: Not on file  Social Connections: Not on file    His Allergies Are:  Allergies  Allergen Reactions   Bee Venom Anaphylaxis   Penicillins Hives and Swelling  :   His Current Medications Are:  Outpatient Encounter Medications as of 06/04/2022  Medication Sig   acetaminophen (TYLENOL) 325 MG tablet Take 650 mg by mouth every 6 (six) hours as needed for moderate pain.   amLODipine (NORVASC) 5 MG tablet Take 1 tablet (5 mg total) by mouth daily.   metoprolol tartrate (LOPRESSOR) 25 MG tablet Take 25 mg by mouth 2 (two) times daily.   rosuvastatin (CRESTOR) 20 MG tablet Take 1 tablet (20 mg total) by mouth daily.   No facility-administered encounter medications on file as of 06/04/2022.  :   Review of Systems:  Out of a complete 14 point review of systems, all are reviewed and negative with the exception of these symptoms as listed below:  Review of Systems  Neurological:        Pt here for sleep consult  Pt some headaches ,fatigue, stop breathing during the night  Pt denies snoring ,hypertension, sleep study,CPAP machine  Pt  is complaining of back pain     ESS:7 FSS;26    Objective:  Neurological Exam  Physical Exam Physical Examination:   Vitals:   06/04/22 0925  BP: 129/68  Pulse: (!) 58    General Examination: The patient is a very pleasant 66 y.o. male in no acute distress. He appears well-developed and well-nourished and well groomed.   HEENT: Normocephalic, atraumatic, pupils are equal, round and reactive to light, extraocular tracking is good without limitation to gaze excursion or nystagmus noted. Hearing is grossly intact. Face is symmetric with normal facial animation. Speech is clear with no dysarthria noted. There is no hypophonia. There is no lip, neck/head, jaw or voice tremor. Neck is supple with full range of passive and active motion. There are no carotid bruits on auscultation. Oropharynx exam  reveals: mild mouth dryness, adequate dental hygiene and moderate airway crowding, due to small airway entry, redundant soft palate and longer uvula.  Tonsils absent.  Mallampati class II.  Neck circumference 16-5/8 inches.  Minimal overbite. Tongue protrudes centrally and palate elevates symmetrically.   Chest: Clear to auscultation without wheezing, rhonchi or crackles noted.  Heart: S1+S2+0, regular and normal without murmurs, rubs or gallops noted.   Abdomen: Soft, non-tender and non-distended.  Extremities: There is trace pitting edema in the distal lower extremities bilaterally, mostly around the ankles.   Skin: Warm and dry without trophic changes noted.   Musculoskeletal: exam reveals no obvious joint deformities,  reports low back pain.  Mild increase in lumbar kyphosis noted.    Neurologically:  Mental status: The patient is awake, alert and oriented in all 4 spheres. His immediate and remote memory, attention, language skills and fund of knowledge are appropriate. There is no evidence of aphasia, agnosia, apraxia or anomia. Speech is clear with normal prosody and enunciation. Thought process is linear. Mood is normal and affect is normal.  Cranial nerves II - XII are as described above under HEENT exam.  Motor exam: Normal bulk, strength and tone is noted. There is no obvious tremor.  Fine motor skills and coordination: grossly intact.  Cerebellar testing: No dysmetria or intention tremor. There is no truncal or gait ataxia.  Sensory exam: intact to light touch in the upper and lower extremities.  Gait, station and balance: He stands easily. No veering to one side is noted. No leaning to one side is noted. Posture is slightly stooped in the lower back, reports low back pain.  No limp, walks without a walking aid.  Assessment and Plan:  In summary, ANTWYNE PINGREE is a very pleasant 66 y.o.-year old male with an underlying medical history of hyperlipidemia, PVCs, bradycardia, history  of paroxysmal atrial tachycardia, atypical chest pain and mild obesity, whose history and physical exam are concerning for sleep disordered breathing, supporting a current working diagnosis of unspecified sleep apnea, with the main differential diagnoses of obstructive sleep apnea (OSA) versus upper airway resistance syndrome (UARS) versus central sleep apnea (CSA), or mixed sleep apnea. A laboratory attended sleep study is considered gold standard for evaluation of sleep disordered breathing and is recommended at this time and clinically justified.   I had a long chat with the patient about my findings and the diagnosis of sleep apnea , particularly OSA, its prognosis and treatment options. We talked about medical/conservative treatments, surgical interventions and non-pharmacological approaches for symptom control. I explained, in particular, the risks and ramifications of untreated moderate to severe OSA, especially with respect to developing cardiovascular disease down the road, including congestive heart failure (CHF), difficult to treat hypertension, cardiac arrhythmias (particularly A-fib), neurovascular complications including TIA, stroke and dementia. Even type 2 diabetes has, in part, been linked to untreated OSA. Symptoms of untreated OSA may include (but may not be limited to) daytime sleepiness, nocturia (i.e. frequent nighttime urination), memory problems, mood irritability and suboptimally controlled or worsening mood disorder such as depression and/or anxiety, lack of energy, lack of motivation, physical discomfort, as well as recurrent headaches, especially morning or nocturnal headaches. We talked about the importance of maintaining a healthy lifestyle and striving for healthy weight.  In addition, we talked about the importance of striving for and maintaining good sleep hygiene. I recommended the following at this time: sleep study.  I outlined the differences between a laboratory attended sleep  study which is considered more comprehensive and accurate over the option of a home sleep test (HST); the latter may lead to underestimation of sleep disordered breathing in some instances and does not help with diagnosing upper airway resistance syndrome and is not accurate enough to diagnose primary central sleep apnea typically. I explained the different sleep test procedures to the patient in detail and also outlined possible surgical and non-surgical treatment options of OSA, including the use of a pressure airway pressure (PAP) device (ie CPAP, AutoPAP/APAP or BiPAP in certain circumstances), a custom-made dental device (aka oral appliance, which would require a referral to a specialist dentist or orthodontist typically, and is  generally speaking not considered a good choice for patients with full dentures or edentulous state), upper airway surgical options, such as traditional UPPP (which is not considered a first-line treatment) or the Inspire device (hypoglossal nerve stimulator, which would involve a referral for consultation with an ENT surgeon, after careful selection, following inclusion criteria). I explained the PAP treatment option to the patient in detail, as this is generally considered first-line treatment.  The patient indicated that he would be willing to try PAP therapy, if the need arises.  He does report claustrophobia.  He would be willing to try nasal interface.  I was able to show him a picture of a nasal cushion interface and also showed him a model of an AutoPap machine.  I explained the importance of being compliant with PAP treatment, not only for insurance purposes but primarily to improve patient's symptoms symptoms, and for the patient's long term health benefit, including to reduce His cardiovascular risks longer-term.    We will pick up our discussion about the next steps and treatment options after testing.  We will keep him posted as to the test results by phone call and/or  MyChart messaging where possible.  We will plan to follow-up in sleep clinic accordingly as well.  I answered all his questions today and the patient was in agreement.   I encouraged him to call with any interim questions, concerns, problems or updates or email [place] through [place].  Generally speaking, sleep test authorizations may take up to 2 weeks, sometimes less, sometimes longer, the patient is encouraged to get in touch with [place] if they do not hear back from the sleep lab staff directly within the next 2 weeks.  Thank you very much for allowing me to participate in the care of this nice patient. If I can be of any further assistance to you please do not hesitate to call me at [phone].  Sincerely,   Thomas Age, MD, PhD

## 2022-06-06 ENCOUNTER — Ambulatory Visit: Payer: Medicare Other | Admitting: Cardiology

## 2022-06-13 ENCOUNTER — Encounter: Payer: Self-pay | Admitting: Family Medicine

## 2022-06-13 ENCOUNTER — Ambulatory Visit (INDEPENDENT_AMBULATORY_CARE_PROVIDER_SITE_OTHER): Payer: Medicare Other | Admitting: Family Medicine

## 2022-06-13 ENCOUNTER — Other Ambulatory Visit: Payer: Medicare Other

## 2022-06-13 ENCOUNTER — Telehealth: Payer: Self-pay

## 2022-06-13 ENCOUNTER — Other Ambulatory Visit: Payer: Self-pay

## 2022-06-13 ENCOUNTER — Telehealth: Payer: Self-pay | Admitting: Cardiology

## 2022-06-13 VITALS — BP 138/70 | HR 41 | Temp 98.2°F | Ht 71.0 in | Wt 230.0 lb

## 2022-06-13 DIAGNOSIS — I493 Ventricular premature depolarization: Secondary | ICD-10-CM | POA: Diagnosis not present

## 2022-06-13 DIAGNOSIS — I83892 Varicose veins of left lower extremities with other complications: Secondary | ICD-10-CM | POA: Diagnosis not present

## 2022-06-13 DIAGNOSIS — M79605 Pain in left leg: Secondary | ICD-10-CM

## 2022-06-13 MED ORDER — METOPROLOL TARTRATE 50 MG PO TABS: 50.0000 mg | ORAL_TABLET | Freq: Two times a day (BID) | ORAL | 3 refills | Status: DC

## 2022-06-13 MED ORDER — METOPROLOL SUCCINATE ER 50 MG PO TB24: 50.0000 mg | ORAL_TABLET | Freq: Every day | ORAL | 3 refills | Status: DC

## 2022-06-13 NOTE — Patient Instructions (Signed)
I do not appreciate a significant amount of lower leg swelling today.  Previous swelling could have been related to varicose veins or other causes of leg swelling.  If the D-dimer is elevated, then would proceed  with an ultrasound.  You can try topical heat over the varicose veins area in front of your lower leg to see if that helps for a possible superficial phlebitis.  If not continuing to improve, return for recheck. Return to the clinic or go to the nearest emergency room if any of your symptoms worsen or new symptoms occur.   Varicose Veins Varicose veins are veins that have become enlarged, bulged, and twisted. They most often appear in the legs. What are the causes? This condition is caused by damage to the valves in the vein. These valves help blood return to your heart. When they are damaged and they stop working properly, blood may flow backward and back up in the veins near the skin, causing the veins to get larger and appear twisted. The condition can result from any issue that causes blood to back up, like pregnancy, prolonged standing, or obesity. What increases the risk? The following factors may make you more likely to develop this condition: Being on your feet a lot. Being pregnant. Being overweight. Smoking. Having had a previous deep vein thrombosis or having a thrombotic disorder. Aging. The risk increases with age. Having a condition called Klippel-Trenaunay syndrome. What are the signs or symptoms? Symptoms of this condition include: Bulging, twisted, and bluish veins. A feeling of heaviness in your legs. This may be worse at the end of the day. Leg pain. This may be worse at the end of the day. Swelling in the leg. Changes in skin color over the veins. Swelling or pain in the legs can limit your activities. Your symptoms may get worse when you sit or stand for long periods of time. How is this diagnosed? This condition may be diagnosed based on: Your symptoms,  family history, activity levels, and lifestyle. A physical exam. You may also have tests, including an ultrasound or X-ray. How is this treated? Treatment for this condition may involve: Avoiding sitting or standing in one position for long periods of time. Wearing compression stockings. These stockings help to prevent blood clots and reduce swelling in the legs. Raising (elevating) the legs when resting. Losing weight. Exercising regularly. If you have persistent symptoms or want to improve the way your varicose veins look, you may choose to have a procedure to close the varicose veins off or to remove them. Nonsurgical treatments to close off the veins include: Sclerotherapy. In this treatment, a solution is injected into a vein to close it off. Laser treatment. The vein is heated with a laser to close it off. Radiofrequency vein ablation. An electrical current produced by radio waves is used to close off the vein. Surgical treatments to remove the veins include: Phlebectomy. In this procedure, the veins are removed through small incisions made over the veins. Vein ligation and stripping. In this procedure, incisions are made over the veins. The veins are then removed after being tied (ligated) with stitches (sutures). Follow these instructions at home: Medicines Take over-the-counter and prescription medicines only as told by your health care provider. If you were prescribed an antibiotic medicine, use it as told by your health care provider. Do not stop using the antibiotic even if you start to feel better. Activity Walk as much as possible. Walking increases blood flow. This helps blood  return to the heart and takes pressure off your veins. Do not stand or sit in one position for a long period of time. Do not sit with your legs crossed. Avoid sitting for a long time without moving. Get up to take short walks every 1-2 hours. This is important to improve blood flow and breathing. Ask  for help if you feel weak or unsteady. Return to your normal activities as told by your health care provider. Ask your health care provider what activities are safe for you. Do exercises as told by your health care provider. General instructions  Follow any diet instructions given to you by your health care provider. Elevate your legs at night to above the level of your heart. If you get a cut in the skin over the varicose vein and the vein bleeds: Lie down with your leg raised. Apply firm pressure to the cut with a clean cloth until the bleeding stops. Place a bandage (dressing) on the cut. Drink enough fluid to keep your urine pale yellow. Do not use any products that contain nicotine or tobacco. These products include cigarettes, chewing tobacco, and vaping devices, such as e-cigarettes. If you need help quitting, ask your health care provider. Wear compression stockings as told by your health care provider. Do not wear other kinds of tight clothing around your legs, pelvis, or waist. Keep all follow-up visits. This is important. Contact a health care provider if: The skin around your varicose veins starts to break down. You have more pain, redness, tenderness, or hard swelling over a vein. You are uncomfortable because of pain. You get a cut in the skin over a varicose vein and it will not stop bleeding. Get help right away if: You have chest pain. You have trouble breathing. You have severe leg pain. Summary Varicose veins are veins that have become enlarged, bulged, and twisted. They most often appear in the legs. This condition is caused by damage to the valves in the vein. These valves help blood return to your heart. Treatment for this condition includes frequent movements, wearing compression stockings, losing weight, and exercising regularly. In some cases, procedures are done to close off or remove the veins. Nonsurgical treatments to close off the veins include sclerotherapy,  laser therapy, and radiofrequency vein ablation. This information is not intended to replace advice given to you by your health care provider. Make sure you discuss any questions you have with your health care provider. Document Revised: 03/14/2021 Document Reviewed: 03/14/2021 Elsevier Patient Education  [place].

## 2022-06-13 NOTE — Telephone Encounter (Signed)
24% PVC burden, that explains his symptoms. I have placed EP referral, hopefully they can see him soon.  Thanks MJP

## 2022-06-13 NOTE — Progress Notes (Signed)
Subjective:  Patient ID: Thomas Harding, male    DOB: [birth year]  Age: 66 y.o. MRN: [id number]  CC:  Chief Complaint  Patient presents with   Leg Swelling    Left foot swelling and raised place on his leg, foot swelling Aug 19 and raised place on his leg today, cardiologist checked the circulation in the leg and was fine. Pt has just tried to elevate the foot for swelling    HPI Thomas Harding presents for   L leg swelling: Initial symptoms on 8/25. Noticed swelling of left ankle and foot. Noticed during day sitting in chair. Felt tight, stretched skin. No injury. No treatments other than elevation. R side not swollen. Improved in few days.  Cramps in front of left lower leg yesterday, then swelling in front of left calf today. Sore in upper area.  No hx of DVT, ultrasound negative in 2021.  No new chest pain or dyspnea.  [place] to the beach. Leaving today - back Monday.   Heart cath 8/1. - told was ok.  No blood thinners.  02/07/22 lower arterial study. Minimal homogenous plaque in bilateral lower extremity. Chronic cold feet.      History Patient Active Problem List   Diagnosis Date Noted   Atypical chest pain 04/26/2022   Apnea spell 04/26/2022   Fatigue 04/26/2022   Morbid obesity ([place]) 04/26/2022   Obesity (BMI 30.0-34.9) 04/26/2022   Elevated coronary artery calcium score 04/25/2022   Abnormal stress test 04/25/2022   Claudication ([place]) 01/31/2022   Paroxysmal atrial tachycardia ([place]) 08/02/2021   Precordial pain 06/07/2021   Bradycardia 06/22/2020   PVC (premature ventricular contraction) 06/22/2020   Spinal stenosis at L4-L5 level 05/18/2020   Exertional dyspnea 11/22/2016   Past Medical History:  Diagnosis Date   Hyperlipidemia    Squamous cell carcinoma of skin 01/16/2017   in situ- right crown    Past Surgical History:  Procedure Laterality Date   LEFT HEART CATH AND CORONARY ANGIOGRAPHY N/A 05/14/2022   Procedure: LEFT HEART CATH AND  CORONARY ANGIOGRAPHY;  Surgeon: Nigel Mormon, MD;  Location: [facility];  Service: Cardiovascular;  Laterality: N/A;   LUMBAR LAMINECTOMY/DECOMPRESSION MICRODISCECTOMY Left 05/18/2020   Procedure: Microlumbar decompression Lumbar Four- Lumbar Five, Lumbar Five- Sacral One left;  Surgeon: Susa Day, MD;  Location: [facility];  Service: Orthopedics;  Laterality: Left;  Microlumbar decompression Lumbar Five- Sacral One left   ROTATOR CUFF REPAIR Right    thumb surgery Left    TONSILLECTOMY     VASECTOMY     Allergies  Allergen Reactions   Bee Venom Anaphylaxis   Penicillins Hives and Swelling   Prior to Admission medications   Medication Sig Start Date End Date Taking? Authorizing Provider  acetaminophen (TYLENOL) 325 MG tablet Take 650 mg by mouth every 6 (six) hours as needed for moderate pain.   Yes [provider]  amLODipine (NORVASC) 5 MG tablet Take 1 tablet (5 mg total) by mouth daily. 04/26/22 08/24/22 Yes Patwardhan, Manish J, MD  metoprolol succinate (TOPROL-XL) 50 MG 24 hr tablet Take 1 tablet (50 mg total) by mouth daily. Take with or immediately following a meal. 06/13/22 06/08/23 Yes Patwardhan, Manish J, MD  rosuvastatin (CRESTOR) 20 MG tablet Take 1 tablet (20 mg total) by mouth daily. 04/26/22 08/24/22 Yes Patwardhan, Reynold Bowen, MD   Social History   Socioeconomic History   Marital status: Married    Spouse name: Not on file  Number of children: 2   Years of education: Not on file   Highest education level: Not on file  Occupational History   Not on file  Tobacco Use   Smoking status: Never   Smokeless tobacco: Never  Vaping Use   Vaping Use: Never used  Substance and Sexual Activity   Alcohol use: Yes    Comment: occ   Drug use: No   Sexual activity: Yes  Other Topics Concern   Not on file  Social History Narrative   Not on file   Social Determinants of Health   Financial Resource Strain: Not on file  Food Insecurity: Not on file   Transportation Needs: Not on file  Physical Activity: Not on file  Stress: Not on file  Social Connections: Not on file  Intimate Partner Violence: Not on file    Review of Systems  Per HPI.  Objective:   Vitals:   06/13/22 1451  BP: 138/70  Pulse: (!) 41  Temp: 98.2 F (36.8 C)  SpO2: 97%  Weight: 230 lb (104.3 kg)  Height: '5\' 11"'$  (1.803 m)     Physical Exam Constitutional:      General: He is not in acute distress.    Appearance: Normal appearance. He is well-developed.  HENT:     Head: Normocephalic and atraumatic.  Cardiovascular:     Rate and Rhythm: Normal rate.  Pulmonary:     Effort: Pulmonary effort is normal.  Musculoskeletal:     Comments: Few small varicose veins on the anterior left lower leg, few medial.  Area of discomfort previously is in anterior lower leg, and area of what appears to be small varicosities.  No appreciable calf swelling, negative Homans, posterior calf nontender without cords.  I do not appreciate asymmetry right versus left.  Ankle, foot nontender, no swelling, faint but palpable DP pulse.  Toes warm, no cyanosis.  Neurological:     Mental Status: He is alert and oriented to person, place, and time.  Psychiatric:        Mood and Affect: Mood normal.        Assessment & Plan:  Thomas Harding is a 66 y.o. male . 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  Not having symptoms of DVT at this time.  Will check D-dimer, and if elevated can check ultrasound which should also evaluate some of the superficial veins.  For now can treat with moist heat, elevation, ER/RTC precautions.  No orders of the defined types were placed in this encounter.  Patient Instructions  I do not appreciate a significant amount of  lower leg swelling today.  Previous swelling could have been related to varicose veins or other causes of leg swelling.  If the D-dimer is elevated, then would proceed  with an ultrasound.  You can try topical heat over the varicose veins area in front of your lower leg to see if that helps for a possible superficial phlebitis.  If not continuing to improve, return for recheck. Return to the clinic or go to the nearest emergency room if any of your symptoms worsen or new symptoms occur.   Varicose Veins Varicose veins are veins that have become enlarged, bulged, and twisted. They most often appear in the legs. What are the causes? This condition is caused  by damage to the valves in the vein. These valves help blood return to your heart. When they are damaged and they stop working properly, blood may flow backward and back up in the veins near the skin, causing the veins to get larger and appear twisted. The condition can result from any issue that causes blood to back up, like pregnancy, prolonged standing, or obesity. What increases the risk? The following factors may make you more likely to develop this condition: Being on your feet a lot. Being pregnant. Being overweight. Smoking. Having had a previous deep vein thrombosis or having a thrombotic disorder. Aging. The risk increases with age. Having a condition called Klippel-Trenaunay syndrome. What are the signs or symptoms? Symptoms of this condition include: Bulging, twisted, and bluish veins. A feeling of heaviness in your legs. This may be worse at the end of the day. Leg pain. This may be worse at the end of the day. Swelling in the leg. Changes in skin color over the veins. Swelling or pain in the legs can limit your activities. Your symptoms may get worse when you sit or stand for long periods of time. How is this diagnosed? This condition may be diagnosed based on: Your symptoms, family history, activity levels, and  lifestyle. A physical exam. You may also have tests, including an ultrasound or X-ray. How is this treated? Treatment for this condition may involve: Avoiding sitting or standing in one position for long periods of time. Wearing compression stockings. These stockings help to prevent blood clots and reduce swelling in the legs. Raising (elevating) the legs when resting. Losing weight. Exercising regularly. If you have persistent symptoms or want to improve the way your varicose veins look, you may choose to have a procedure to close the varicose veins off or to remove them. Nonsurgical treatments to close off the veins include: Sclerotherapy. In this treatment, a solution is injected into a vein to close it off. Laser treatment. The vein is heated with a laser to close it off. Radiofrequency vein ablation. An electrical current produced by radio waves is used to close off the vein. Surgical treatments to remove the veins include: Phlebectomy. In this procedure, the veins are removed through small incisions made over the veins. Vein ligation and stripping. In this procedure, incisions are made over the veins. The veins are then removed after being tied (ligated) with stitches (sutures). Follow these instructions at home: Medicines Take over-the-counter and prescription medicines only as told by your health care provider. If you were prescribed an antibiotic medicine, use it as told by your health care provider. Do not stop using the antibiotic even if you start to feel better. Activity Walk as much as possible. Walking increases blood flow. This helps blood return to the heart and takes pressure off your veins. Do not stand or sit in one position for a long period of time. Do not sit with your legs crossed. Avoid sitting for a long time without moving. Get up to take short walks every 1-2 hours. This is important to improve blood flow and breathing. Ask for help if you feel weak or  unsteady. Return to your normal activities as told by your health care provider. Ask your health care provider what activities are safe for you. Do exercises as told by your health care provider. General instructions  Follow any diet instructions given to you by your health care provider. Elevate your legs at night to above the level of your heart. If you  get a cut in the skin over the varicose vein and the vein bleeds: Lie down with your leg raised. Apply firm pressure to the cut with a clean cloth until the bleeding stops. Place a bandage (dressing) on the cut. Drink enough fluid to keep your urine pale yellow. Do not use any products that contain nicotine or tobacco. These products include cigarettes, chewing tobacco, and vaping devices, such as e-cigarettes. If you need help quitting, ask your health care provider. Wear compression stockings as told by your health care provider. Do not wear other kinds of tight clothing around your legs, pelvis, or waist. Keep all follow-up visits. This is important. Contact a health care provider if: The skin around your varicose veins starts to break down. You have more pain, redness, tenderness, or hard swelling over a vein. You are uncomfortable because of pain. You get a cut in the skin over a varicose vein and it will not stop bleeding. Get help right away if: You have chest pain. You have trouble breathing. You have severe leg pain. Summary Varicose veins are veins that have become enlarged, bulged, and twisted. They most often appear in the legs. This condition is caused by damage to the valves in the vein. These valves help blood return to your heart. Treatment for this condition includes frequent movements, wearing compression stockings, losing weight, and exercising regularly. In some cases, procedures are done to close off or remove the veins. Nonsurgical treatments to close off the veins include sclerotherapy, laser therapy, and  radiofrequency vein ablation. This information is not intended to replace advice given to you by your health care provider. Make sure you discuss any questions you have with your health care provider. Document Revised: 03/14/2021 Document Reviewed: 03/14/2021 Elsevier Patient Education  [place],   Merri Ray, MD [place] 06/13/22 3:57 PM

## 2022-06-13 NOTE — Telephone Encounter (Signed)
Patient called in this morning wanting to know results from monitor, he states that he is still having symptoms of shortness of breath along with fatigue. He states he would like to know what the next steps will be. Please advise

## 2022-06-13 NOTE — Telephone Encounter (Signed)
24% PVC burden, continues to be symptomatic.  Referred to EP. Wants to look into ablation. Increase metoprolol succinate to 50 mg daily for now.    Nigel Mormon, MD Pager: [phone] Office: [phone]

## 2022-06-14 NOTE — Telephone Encounter (Signed)
Patient is aware Dr. Virgina Jock spoke to patient yesterday 06/13/22 regarding his results

## 2022-07-02 ENCOUNTER — Telehealth: Payer: Self-pay | Admitting: Neurology

## 2022-07-02 NOTE — Telephone Encounter (Signed)
[employer]- [employer] medicare no auth req.  Patient is scheduled at [facility] for 07/23/22 at 9 pm.  Mailed packet to the patient

## 2022-07-23 ENCOUNTER — Ambulatory Visit (INDEPENDENT_AMBULATORY_CARE_PROVIDER_SITE_OTHER): Payer: Medicare Other | Admitting: Neurology

## 2022-07-23 DIAGNOSIS — E669 Obesity, unspecified: Secondary | ICD-10-CM

## 2022-07-23 DIAGNOSIS — R0789 Other chest pain: Secondary | ICD-10-CM

## 2022-07-23 DIAGNOSIS — G4720 Circadian rhythm sleep disorder, unspecified type: Secondary | ICD-10-CM

## 2022-07-23 DIAGNOSIS — I4719 Other supraventricular tachycardia: Secondary | ICD-10-CM

## 2022-07-23 DIAGNOSIS — R9431 Abnormal electrocardiogram [ECG] [EKG]: Secondary | ICD-10-CM

## 2022-07-23 DIAGNOSIS — Z9189 Other specified personal risk factors, not elsewhere classified: Secondary | ICD-10-CM

## 2022-07-23 DIAGNOSIS — G4733 Obstructive sleep apnea (adult) (pediatric): Secondary | ICD-10-CM

## 2022-07-23 DIAGNOSIS — G478 Other sleep disorders: Secondary | ICD-10-CM

## 2022-07-23 DIAGNOSIS — R519 Headache, unspecified: Secondary | ICD-10-CM

## 2022-07-23 DIAGNOSIS — R0681 Apnea, not elsewhere classified: Secondary | ICD-10-CM

## 2022-07-29 ENCOUNTER — Institutional Professional Consult (permissible substitution): Payer: Medicare Other | Admitting: Internal Medicine

## 2022-07-30 ENCOUNTER — Encounter: Payer: Self-pay | Admitting: Neurology

## 2022-07-30 NOTE — Addendum Note (Signed)
Addended by: Star Age on: 07/30/2022 06:28 PM   Modules accepted: Orders

## 2022-07-30 NOTE — Procedures (Signed)
Physician Interpretation:     [facility] POLYSOMNOGRAPHY  INTERPRETATION REPORT   STUDY DATE:  07/23/2022     PATIENT NAME:  Thomas Harding         DATE OF BIRTH:  October 03, 1956  PATIENT ID:  [id number]    TYPE OF STUDY:  PSG  READING PHYSICIAN: Star Age, MD, PhD REFERRED BY: Dr. Talbert Forest TECHNICIAN: Richard Miu, RPSGT   HISTORY: 66 year old right-handed gentleman with an underlying medical history of hyperlipidemia, PVCs, bradycardia, history of paroxysmal atrial tachycardia, atypical chest pain and mild obesity, who reports some daytime tiredness, nonrestorative sleep and witnessed apneas. His Epworth sleepiness score is 7 out of 24, fatigue severity score is 26 out of 63. Height: 71 in Weight: 213 lb (BMI 29) Neck Size: 17 in  MEDICATIONS: Tylenol, Norvasc, Lopressor, Crestor TECHNICAL DESCRIPTION: A registered sleep technologist was in attendance for the duration of the recording.  Data collection, scoring, video monitoring, and reporting were performed in compliance with the AASM Manual for the Scoring of Sleep and Associated Events; (Hypopnea is scored based on the criteria listed in Section VIII D. 1b in the AASM Manual V2.6 using a 4% oxygen desaturation rule or Hypopnea is scored based on the criteria listed in Section VIII D. 1a in the AASM Manual V2.6 using 3% oxygen desaturation and /or arousal rule).   SLEEP CONTINUITY AND SLEEP ARCHITECTURE:  Lights-out was at 21:43: and lights-on at  05:06:, with a total recording time of 7 hours, 24 minutes. Total sleep time ( TST) was 340.0 minutes with a decreased sleep efficiency at 76.6%.   BODY POSITION:  TST was divided  between the following sleep positions: 4.1% supine;  95.6% lateral;  0% prone. Duration of total sleep and percent of total sleep in their respective position is as follows: supine 14 minutes (4%), non-supine 326 minutes (96%); right 64 minutes (19%), left 261 minutes (77%),  and prone 01 minutes (0%).  Total supine REM sleep time was 00 minutes (0% of total REM sleep). Sleep latency was normal at 14.0 minutes.  REM sleep latency was slightly reduced at 63.5 minutes. Of the total sleep time, the percentage of stage N1 sleep was 8.5%, stage N2 sleep was 63%, which is mildly increased, stage N3 sleep was 2.8%, and REM sleep was 25.3%, which is normal. Wake after sleep onset (WASO) time accounted for 90 minutes with mild to moderate sleep fragmentation noted.  RESPIRATORY MONITORING:  Based on CMS criteria (using a 4% oxygen desaturation rule for scoring hypopneas), there were 10 apneas (8 obstructive; 2 central; 0 mixed), and 140 hypopneas.  Apnea index was 1.8. Hypopnea index was 24.7. The apnea-hypopnea index was 26.5/hour overall (8.6 supine, 45 non-supine; 45.3 REM, 0.0 supine REM).  There were 0 respiratory effort-related arousals (RERAs).  The RERA index was 0 events/h. Total respiratory disturbance index (RDI) was 26.5 events/h. RDI results showed: supine RDI  8.6 /h; non-supine RDI 27.2 /h; REM RDI 45.3 /h, supine REM RDI 0.0 /h.   Based on AASM criteria (using a 3% oxygen desaturation and /or arousal rule for scoring hypopneas), there were 10 apneas (8 obstructive; 2 central; 0 mixed), and 143 hypopneas. Apnea index was 1.8. Hypopnea index was 25.2. The apnea-hypopnea index was 27.0 overall (8.6 supine, 47 non-supine; 46.7 REM, 0.0 supine REM).  There were 0 respiratory effort-related arousals (RERAs).  The RERA index was 0 events/h. Total respiratory disturbance index (RDI) was 27.0 events/h. RDI results showed: supine RDI  8.6 /h; non-supine RDI 27.8 /h; REM RDI 46.7 /h, supine REM RDI 0.0 /h.  OXIMETRY: Oxyhemoglobin Saturation Nadir during sleep was at  82%) from a mean of 93%.  Of the Total sleep time (TST)   hypoxemia (=<88%) was present for  10.2 minutes, or 3.0% of total sleep time.  LIMB MOVEMENTS: There were 0 periodic limb movements of sleep (0.0/hr), of which  0 (0.0/hr) were associated with an arousal. AROUSAL: There were 56 arousals in total, for an arousal index of 10 arousals/hour.  Of these, 29 were identified as respiratory-related arousals (5 /h), 0 were PLM-related arousals (0 /h), and 48 were non-specific arousals (8 /h). Snoring was classified as mild to moderate.  EEG: Review of the EEG showed no abnormal electrical discharges and symmetrical bihemispheric findings.    EKG: The EKG revealed normal sinus rhythm (NSR), with PVCs noted. The average heart rate during sleep was 57 bpm. The heart rate during sleep varied between a minimum of Tachycardia and a maximum of 82 bpm.  AUDIO/VIDEO REVIEW: The audio and video review did not show any abnormal or unusual behaviors, movements, phonations or vocalizations. The patient took 1 restroom break. Snoring was mild to moderate  POST-STUDY QUESTIONNAIRE: Post study, the patient indicated, that sleep was worse than usual.  He commented that the sleep tech was very informative and that he appreciated the information she provided both before and after the study.   IMPRESSION:   1. Obstructive sleep apnea (OSA) 2. Dysfunctions associated with sleep stages or arousal from sleep 3. Non-specific abnormal electrocardiogram (EKG)  RECOMMENDATIONS:   1. This study demonstrates moderate to severe obstructive sleep apnea, with a total AHI of 26.5/hour, REM AHI of 45.3/hour, supine AHI of 8.6/hour and O2 nadir of 82%. Treatment with positive airway pressure in the form of CPAP is recommended. This will require a full night titration study to optimize therapy settings, mask fit, monitoring of tolerance and of proper oxygen saturations. Other treatment options may be limited, and may include (generally speaking) surgical options in selected patients or the use of an oral appliance in certain patients. Concomitant weight loss is recommended, where clinically appropriate. Please note that untreated obstructive sleep  apnea may carry additional perioperative morbidity. Patients with significant obstructive sleep apnea should receive perioperative PAP therapy and the surgeons and particularly the anesthesiologist should be informed of the diagnosis and the severity of the sleep disordered breathing. 2. This study shows sleep fragmentation and mildly abnormal sleep stage percentages; these are nonspecific findings and per se do not signify an intrinsic sleep disorder or a cause for the patient's sleep-related symptoms. Causes include (but are not limited to) the first night effect of the sleep study, circadian rhythm disturbances, medication effect or an underlying mood disorder or medical problem.  3. The patient should be cautioned not to drive, work at heights, or operate dangerous or heavy equipment when tired or sleepy. Review and reiteration of good sleep hygiene measures should be pursued with any patient. 4. The study showed occasional PVCs on single lead EKG; clinical correlation is recommended; the patient is well-known to cardiology.    5. The patient will be seen in follow-up in the sleep clinic at [facility] for discussion of the test results, symptom and treatment compliance review, further management strategies, etc. The patient and his referring provider will be notified of the test results.   I certify that I have reviewed the entire raw data recording prior to the issuance of this report  in accordance with the Standards of Accreditation of the [place] of Sleep Medicine (AASM).  Star Age, MD, PhD [facility]) [place], ABPN (Neurology and Sleep)              Technical Report:   General Information  Name: Thomas Harding, Thomas Harding BMI: 29.71 Physician: Star Age, MD  ID: [id number] Height: 71.0 in Technician: Richard Miu, RPSGT  Sex: Male Weight: 213.0 lb Record: [id number]  Age: 60 [09/05/1956] Date: 07/23/2022    Medical & Medication History    66 year old  right-handed gentleman with an underlying medical history of hyperlipidemia, PVCs, bradycardia, history of paroxysmal atrial tachycardia, atypical chest pain and mild obesity, who reports some daytime tiredness, nonrestorative sleep and witnessed apneas.  Tylenol, Norvasc, Lopressor, Crestor   Sleep Disorder      Comments   The patient came into the lab for a PSG. The patient had one restroom break. EKG showed PVC's. Mild to moderate snoring. All sleep stages witnessed. Respiratory events scored with a 4% desat. Slept mostly lateral.     Lights out: 09:43:11 PM Lights on: 05:06:48 AM   Time Total Supine Side Prone Upright  Recording (TRT) 7h 24.776m0h 30.551mh 35.76m63m 18.62m 57m0.76m  33mep (TST) 5h 40.76m 0h59m.76m 5h 71m76m 0h 161m 0h 0.32m  Late51m N1 N2 N3 REM Onset Per. Slp. Eff.  Actual 0h 0.76m 0h 6.76m4m 33.62m762m 3.62m 092m4.76m 077m4.76m 7687m%   Stg81mr Wake N1 N2 N3 REM  Total 104.0 29.0 215.5 9.5 86.0  Supine 16.5 7.0 7.0 0.0 0.0  Side 70.0 21.0 208.5 9.5 86.0  Prone 17.5 1.0 0.0 0.0 0.0  Upright 0.0 0.0 0.0 0.0 0.0   Stg % Wake N1 N2 N3 REM  Total 23.4 8.5 63.4 2.8 25.3  Supine 3.7 2.1 2.1 0.0 0.0  Side 15.8 6.2 61.3 2.8 25.3  Prone 3.9 0.3 0.0 0.0 0.0  Upright 0.0 0.0 0.0 0.0 0.0     Apnea Summary Sub Supine Side Prone Upright  Total 10 Total 10 0 10 0 0    REM 7 0 7 0 0    NREM 3 0 3 0 0  Obs 8 REM 5 0 5 0 0    NREM 3 0 3 0 0  Mix 0 REM 0 0 0 0 0    NREM 0 0 0 0 0  Cen 2 REM 2 0 2 0 0    NREM 0 0 0 0 0   Rera Summary Sub Supine Side Prone Upright  Total 0 Total 0 0 0 0 0    REM 0 0 0 0 0    NREM 0 0 0 0 0   Hypopnea Summary Sub Supine Side Prone Upright  Total 143 Total 143 2 140 1 0    REM 60 0 60 0 0    NREM 83 2 80 1 0   4% Hypopnea Summary Sub Supine Side Prone Upright  Total (4%) 140 Total 140 2 137 1 0    REM 58 0 58 0 0    NREM 82 2 79 1 0     AHI Total Obs Mix Cen  27.00 Apnea 1.76 1.41 0.00 0.35   Hypopnea 25.24 -- -- --  26.47 Hypopnea (4%)  24.71 -- -- --    Total Supine Side Prone Upright  Position AHI 27.00 8.57 27.69 60.00 0.00  REM AHI 46.74   NREM AHI 20.31  Position RDI 27.00 8.57 27.69 60.00 0.00  REM RDI 46.74   NREM RDI 20.31    4% Hypopnea Total Supine Side Prone Upright  Position AHI (4%) 26.47 8.57 27.14 60.00 0.00  REM AHI (4%) 45.35   NREM AHI (4%) 20.08   Position RDI (4%) 26.47 8.57 27.14 60.00 0.00  REM RDI (4%) 45.35   NREM RDI (4%) 20.08    Desaturation Information Threshold: 2% <100% <90% <80% <70% <60% <50% <40%  Supine 11.0 1.0 0.0 0.0 0.0 0.0 0.0  Side 251.0 46.0 0.0 0.0 0.0 0.0 0.0  Prone 10.0 0.0 0.0 0.0 0.0 0.0 0.0  Upright 0.0 0.0 0.0 0.0 0.0 0.0 0.0  Total 272.0 47.0 0.0 0.0 0.0 0.0 0.0  Index 38.1 6.6 0.0 0.0 0.0 0.0 0.0   Threshold: 3% <100% <90% <80% <70% <60% <50% <40%  Supine 2.0 1.0 0.0 0.0 0.0 0.0 0.0  Side 171.0 44.0 0.0 0.0 0.0 0.0 0.0  Prone 3.0 0.0 0.0 0.0 0.0 0.0 0.0  Upright 0.0 0.0 0.0 0.0 0.0 0.0 0.0  Total 176.0 45.0 0.0 0.0 0.0 0.0 0.0  Index 24.7 6.3 0.0 0.0 0.0 0.0 0.0   Threshold: 4% <100% <90% <80% <70% <60% <50% <40%  Supine 2.0 1.0 0.0 0.0 0.0 0.0 0.0  Side 168.0 44.0 0.0 0.0 0.0 0.0 0.0  Prone 2.0 0.0 0.0 0.0 0.0 0.0 0.0  Upright 0.0 0.0 0.0 0.0 0.0 0.0 0.0  Total 172.0 45.0 0.0 0.0 0.0 0.0 0.0  Index 24.1 6.3 0.0 0.0 0.0 0.0 0.0   Threshold: 3% <100% <90% <80% <70% <60% <50% <40%  Supine 2 1 0 0 0 0 0  Side 171 44 0 0 0 0 0  Prone 3 0 0 0 0 0 0  Upright 0 0 0 0 0 0 0  Total 176 45 0 0 0 0 0   Awakening/Arousal Information # of Awakenings 31  Wake after sleep onset 90.4m Wake after persistent sleep 87.567m Arousal Assoc. Arousals Index  Apneas 2 0.4  Hypopneas 27 4.8  Leg Movements 0 0.0  Snore 0 0.0  PTT Arousals 0 0.0  Spontaneous 49 8.6  Total 78 13.8  Leg Movement Information PLMS LMs Index  Total LMs during PLMS 0 0.0  LMs w/ Microarousals 0 0.0   LM LMs Index  w/ Microarousal 0 0.0  w/ Awakening 0 0.0  w/ Resp Event 0 0.0   Spontaneous 0 0.0  Total 0 0.0     Desaturation threshold setting: 3% Minimum desaturation setting: 10 seconds SaO2 nadir: 59% The longest event was a 97 sec obstructive Hypopnea with a minimum SaO2 of 87%. The lowest SaO2 was 82% associated with a 12 sec obstructive Apnea. EKG Rates EKG Avg Max Min  Awake 64 100 37  Asleep 57 82 46  EKG Events: Tachycardia

## 2022-07-31 ENCOUNTER — Encounter: Payer: Self-pay | Admitting: Family Medicine

## 2022-07-31 ENCOUNTER — Ambulatory Visit (INDEPENDENT_AMBULATORY_CARE_PROVIDER_SITE_OTHER): Payer: Medicare Other | Admitting: Family Medicine

## 2022-07-31 VITALS — BP 138/78 | HR 67 | Temp 98.1°F | Ht 71.0 in | Wt 230.0 lb

## 2022-07-31 DIAGNOSIS — Z9889 Other specified postprocedural states: Secondary | ICD-10-CM

## 2022-07-31 DIAGNOSIS — R5383 Other fatigue: Secondary | ICD-10-CM

## 2022-07-31 DIAGNOSIS — Y92009 Unspecified place in unspecified non-institutional (private) residence as the place of occurrence of the external cause: Secondary | ICD-10-CM

## 2022-07-31 DIAGNOSIS — M21372 Foot drop, left foot: Secondary | ICD-10-CM | POA: Diagnosis not present

## 2022-07-31 DIAGNOSIS — S40811A Abrasion of right upper arm, initial encounter: Secondary | ICD-10-CM | POA: Diagnosis not present

## 2022-07-31 DIAGNOSIS — I493 Ventricular premature depolarization: Secondary | ICD-10-CM

## 2022-07-31 DIAGNOSIS — R200 Anesthesia of skin: Secondary | ICD-10-CM

## 2022-07-31 DIAGNOSIS — Z0000 Encounter for general adult medical examination without abnormal findings: Secondary | ICD-10-CM | POA: Diagnosis not present

## 2022-07-31 DIAGNOSIS — M48061 Spinal stenosis, lumbar region without neurogenic claudication: Secondary | ICD-10-CM

## 2022-07-31 DIAGNOSIS — Z23 Encounter for immunization: Secondary | ICD-10-CM | POA: Diagnosis not present

## 2022-07-31 DIAGNOSIS — E782 Mixed hyperlipidemia: Secondary | ICD-10-CM | POA: Diagnosis not present

## 2022-07-31 DIAGNOSIS — R739 Hyperglycemia, unspecified: Secondary | ICD-10-CM

## 2022-07-31 DIAGNOSIS — G4733 Obstructive sleep apnea (adult) (pediatric): Secondary | ICD-10-CM

## 2022-07-31 DIAGNOSIS — W19XXXA Unspecified fall, initial encounter: Secondary | ICD-10-CM | POA: Diagnosis not present

## 2022-07-31 DIAGNOSIS — R001 Bradycardia, unspecified: Secondary | ICD-10-CM

## 2022-07-31 NOTE — Patient Instructions (Addendum)
Call Dr. Tonita Cong for appointment soon to discuss leg weakness and falls. No ladders or work form elevated surfaces for now.  I am concerned about your low heart rate and fatigue. You may need to decrease your toprol dose- please call cardiology tomorrow morning and let them know about your symptoms and the low heart rates.  Please have labs performed at [facility] location when possible in the next few days.   Thanks for coming in today.   Return to the clinic or go to the nearest emergency room if any of your symptoms worsen or new symptoms occur.  [facility] in 8:30-4:30 during weekdays, no appointment needed [place].  [place], [place] [address]   Preventive Care 65 Years and Older, Male Preventive care refers to lifestyle choices and visits with your health care provider that can promote health and wellness. Preventive care visits are also called wellness exams. What can I expect for my preventive care visit? Counseling During your preventive care visit, your health care provider may ask about your: Medical history, including: Past medical problems. Family medical history. History of falls. Current health, including: Emotional well-being. Home life and relationship well-being. Sexual activity. Memory and ability to understand (cognition). Lifestyle, including: Alcohol, nicotine or tobacco, and drug use. Access to firearms. Diet, exercise, and sleep habits. Work and work Statistician. Sunscreen use. Safety issues such as seatbelt and bike helmet use. Physical exam Your health care provider will check your: Height and weight. These may be used to calculate your BMI (body mass index). BMI is a measurement that tells if you are at a healthy weight. Waist circumference. This measures the distance around your waistline. This measurement also tells if you are at a healthy weight and may help predict your risk of certain diseases, such as type 2 diabetes and high blood  pressure. Heart rate and blood pressure. Body temperature. Skin for abnormal spots. What immunizations do I need?  Vaccines are usually given at various ages, according to a schedule. Your health care provider will recommend vaccines for you based on your age, medical history, and lifestyle or other factors, such as travel or where you work. What tests do I need? Screening Your health care provider may recommend screening tests for certain conditions. This may include: Lipid and cholesterol levels. Diabetes screening. This is done by checking your blood sugar (glucose) after you have not eaten for a while (fasting). Hepatitis C test. Hepatitis B test. HIV (human immunodeficiency virus) test. STI (sexually transmitted infection) testing, if you are at risk. Lung cancer screening. Colorectal cancer screening. Prostate cancer screening. Abdominal aortic aneurysm (AAA) screening. You may need this if you are a current or former smoker. Talk with your health care provider about your test results, treatment options, and if necessary, the need for more tests. Follow these instructions at home: Eating and drinking  Eat a diet that includes fresh fruits and vegetables, whole grains, lean protein, and low-fat dairy products. Limit your intake of foods with high amounts of sugar, saturated fats, and salt. Take vitamin and mineral supplements as recommended by your health care provider. Do not drink alcohol if your health care provider tells you not to drink. If you drink alcohol: Limit how much you have to 0-2 drinks a day. Know how much alcohol is in your drink. In the [place] one drink equals one 12 oz bottle of beer (355 mL), one 5 oz glass of wine (148 mL), or one 1 oz glass  of hard liquor (44 mL). Lifestyle Brush your teeth every morning and night with fluoride toothpaste. Floss one time each day. Exercise for at least 30 minutes 5 or more days each week. Do not use any products that  contain nicotine or tobacco. These products include cigarettes, chewing tobacco, and vaping devices, such as e-cigarettes. If you need help quitting, ask your health care provider. Do not use drugs. If you are sexually active, practice safe sex. Use a condom or other form of protection to prevent STIs. Take aspirin only as told by your health care provider. Make sure that you understand how much to take and what form to take. Work with your health care provider to find out whether it is safe and beneficial for you to take aspirin daily. Ask your health care provider if you need to take a cholesterol-lowering medicine (statin). Find healthy ways to manage stress, such as: Meditation, yoga, or listening to music. Journaling. Talking to a trusted person. Spending time with friends and family. Safety Always wear your seat belt while driving or riding in a vehicle. Do not drive: If you have been drinking alcohol. Do not ride with someone who has been drinking. When you are tired or distracted. While texting. If you have been using any mind-altering substances or drugs. Wear a helmet and other protective equipment during sports activities. If you have firearms in your house, make sure you follow all gun safety procedures. Minimize exposure to UV radiation to reduce your risk of skin cancer. What's next? Visit your health care provider once a year for an annual wellness visit. Ask your health care provider how often you should have your eyes and teeth checked. Stay up to date on all vaccines. This information is not intended to replace advice given to you by your health care provider. Make sure you discuss any questions you have with your health care provider. Document Revised: 03/28/2021 Document Reviewed: 03/28/2021 Elsevier Patient Education  [place].

## 2022-07-31 NOTE — Progress Notes (Unsigned)
Subjective:  Patient ID: Thomas Harding, male    DOB: 1956/02/10  Age: 66 y.o. MRN: [id number]  CC:  Chief Complaint  Patient presents with   Annual Exam    Pt has no feeling in his left foot and causes him to fall    HPI Thomas Harding presents for Annual Exam  L foot numbness: History of spinal stenosis, lumbar radiculopathy.  Surgery with Dr. Tonita Cong  Microlumbar decompression, lumbar laminectomy/decompression microdiscectomy 05/18/2020. Ongoing foot dysesthesias for years when discussed in May 2022.  Not overly bothersome at that time and deferred gabapentin.    Possible claudication was discussed with cardiology and lower arterial testing performed in April without hemodynamically significant stenosis in the right lower arterial system or left lower system.  ABI 1.14 on right, 1.09 on left.  Minimal plaque in bilateral lower extremity but normal triphasic waveforms at the ankles.  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  We discussed possible incomplete foot drop with dorsiflexion weakness and possible need for AFO orthosis.  Ortho follow-up planned when discussed in May-tripping discussed at that time as well. Has not followed up with ortho - last saw Dr. Tonita Cong about a year or so ago.  No new back pain. No bowel or bladder incontinence, no saddle anesthesia, no lower extremity weakness - feels like foot plops down on left.  Tingling in both feet for years. Numbness in left foot worse after back surgery - feels like wrapped tight in am. Loosens up during the day. left is worse. Trouble lifting up leg and foot on left compared to right leg. Past few months.  Has been having a few falls - trip/stumble past few months. Last one about 2 weeks ago - walking down steps - missed step, R arm got caught b/t rails. Abrasions, bruising. No bone pain, or difficulty with use or weakness.  No head injury. Arm is improving.   OSA: Testing on 10/10 - moderate to severe  OSA. Plan for titration for CPAP.   PVC's Appointment with EP - Dr. Lovena Le on Friday. 24%PVC burden on heart monitor, metoprolol increased to '50mg'$  on 8/31.  Cardiology follow up 11/16. Coronary angiography 05/14/22 - Right dominant circulation, No angiographic evidence of coronary artery disease, slightly sluggish flow likely due to frequent PVCs/ventricular bigeminy  Hypertension: Toprol '50mg'$  qd (dose increased few months ago), amlodipine '5mg'$  qd.  Home readings:120/70 range.  Felt more fatigue yesterday. More heart fluttering. Low HR on monitor in 40's-50's. Feel better today. No chest pain.   BP Readings from Last 3 Encounters:  07/31/22 138/78  06/13/22 138/70  06/04/22 129/68   Lab Results  Component Value Date   CREATININE 1.17 05/14/2022    Hyperlipidemia: Crestor '20mg'$  qd. No myalgias/side effects.  Lab Results  Component Value Date   CHOL 106 05/09/2022   HDL 40 05/09/2022   LDLCALC 46 05/09/2022   LDLDIRECT 95.0 02/25/2022   TRIG 111 05/09/2022   CHOLHDL 2.7 05/09/2022   Lab Results  Component Value Date   ALT 20 02/25/2022   AST 21 02/25/2022   ALKPHOS 68 02/25/2022   BILITOT 0.7 02/25/2022   Hyperglycemia: 105, 111, 99 over past few years. 100 on 05/14/22.  Lab Results  Component Value Date   HGBA1C 5.5 07/21/2020         07/31/2022    2:48 PM 06/13/2022    2:49 PM 02/20/2022   10:48 AM 08/20/2021    4:01 PM 02/12/2021    3:51  PM  Depression screen PHQ 2/9  Decreased Interest 0 0 0 0 0  Down, Depressed, Hopeless 0 0 0 0 0  PHQ - 2 Score 0 0 0 0 0  Altered sleeping 0 0     Tired, decreased energy 0 0     Change in appetite 0 0     Feeling bad or failure about yourself  0 0     Trouble concentrating 0 0     Moving slowly or fidgety/restless 0 0     Suicidal thoughts 0 0     PHQ-9 Score 0 0       Health Maintenance  Topic Date Due   INFLUENZA VACCINE  05/14/2022   COVID-19 Vaccine (3 - Moderna risk series) 08/16/2022 (Originally 09/18/2020)    Hepatitis C Screening  08/20/2022 (Originally [birth year]01/1974)   Zoster Vaccines- Shingrix (1 of 2) 09/12/2022 (Originally 11/02/1975)   Pneumonia Vaccine 30+ Years old (1 - PCV) 02/21/2023 (Originally 11/01/2021)   TETANUS/TDAP  03/16/2027   COLONOSCOPY (Pts 45-76yr Insurance coverage will need to be confirmed)  10/13/2028   HIV Screening  Completed   HPV VACCINES  Aged Out  Colonoscopy 10/13/18. Repeat 5 years. Prostate: does not  have family history of prostate cancer The natural history of prostate cancer and ongoing controversy regarding screening and potential treatment outcomes of prostate cancer has been discussed with the patient. The meaning of a false positive PSA and a false negative PSA has been discussed. He indicates understanding of the limitations of this screening test and wishes to proceed with screening PSA testing. Prior derm - [facility]. Every 6 months -will be seeing new derm.   Lab Results  Component Value Date   PSA1 1.7 07/21/2020   PSA1 1.1 06/24/2018   PSA 1.09 02/15/2021    Immunization History  Administered Date(s) Administered   Influenza,inj,Quad PF,6+ Mos 06/24/2018, 07/26/2020, 08/20/2021   Moderna SARS-COV2 Booster Vaccination 08/21/2020   Tdap 03/15/2017  Declines new covid booster.  Flu vaccine today. Shingrix - recommended at pharmacy.  RSV vaccine discussed.  Prevnar 20 today.    No results found. Optho yearly.   Dental:yearly   Alcohol: rare  Tobacco: none.   Exercise:active with work.    History Patient Active Problem List   Diagnosis Date Noted   Atypical chest pain 04/26/2022   Apnea spell 04/26/2022   Fatigue 04/26/2022   Morbid obesity ([place] 04/26/2022   Obesity (BMI 30.0-34.9) 04/26/2022   Elevated coronary artery calcium score 04/25/2022   Abnormal stress test 04/25/2022   Claudication ([place] 01/31/2022   Paroxysmal atrial tachycardia 08/02/2021   Precordial pain 06/07/2021   Bradycardia 06/22/2020   PVC (premature  ventricular contraction) 06/22/2020   Spinal stenosis at L4-L5 level 05/18/2020   Exertional dyspnea 11/22/2016   Past Medical History:  Diagnosis Date   Hyperlipidemia    Squamous cell carcinoma of skin 01/16/2017   in situ- right crown    Past Surgical History:  Procedure Laterality Date   LEFT HEART CATH AND CORONARY ANGIOGRAPHY N/A 05/14/2022   Procedure: LEFT HEART CATH AND CORONARY ANGIOGRAPHY;  Surgeon: PNigel Mormon MD;  [facility];  Service: Cardiovascular;  Laterality: N/A;   LUMBAR LAMINECTOMY/DECOMPRESSION MICRODISCECTOMY Left 05/18/2020   Procedure: Microlumbar decompression Lumbar Four- Lumbar Five, Lumbar Five- Sacral One left;  Surgeon: BSusa Day MD;  [facility]  Service: Orthopedics;  Laterality: Left;  Microlumbar decompression Lumbar Five- Sacral One left   ROTATOR CUFF REPAIR Right  thumb surgery Left    TONSILLECTOMY     VASECTOMY     Allergies  Allergen Reactions   Bee Venom Anaphylaxis   Penicillins Hives and Swelling   Prior to Admission medications   Medication Sig Start Date End Date Taking? Authorizing Provider  acetaminophen (TYLENOL) 325 MG tablet Take 650 mg by mouth every 6 (six) hours as needed for moderate pain.   Yes [provider]  amLODipine (NORVASC) 5 MG tablet Take 1 tablet (5 mg total) by mouth daily. 04/26/22 08/24/22 Yes Patwardhan, Manish J, MD  metoprolol succinate (TOPROL-XL) 50 MG 24 hr tablet Take 1 tablet (50 mg total) by mouth daily. Take with or immediately following a meal. 06/13/22 06/08/23 Yes Patwardhan, Manish J, MD  rosuvastatin (CRESTOR) 20 MG tablet Take 1 tablet (20 mg total) by mouth daily. 04/26/22 08/24/22 Yes Patwardhan, Reynold Bowen, MD   Social History   Socioeconomic History   Marital status: Married    Spouse name: Not on file   Number of children: 2   Years of education: Not on file   Highest education level: Not on file  Occupational History   Not on file  Tobacco Use    Smoking status: Never   Smokeless tobacco: Never  Vaping Use   Vaping Use: Never used  Substance and Sexual Activity   Alcohol use: Yes    Comment: occ   Drug use: No   Sexual activity: Yes  Other Topics Concern   Not on file  Social History Narrative   Not on file   Social Determinants of Health   Financial Resource Strain: Not on file  Food Insecurity: Not on file  Transportation Needs: Not on file  Physical Activity: Not on file  Stress: Not on file  Social Connections: Not on file  Intimate Partner Violence: Not on file    Review of Systems 13 point review of systems per patient health survey noted.  Negative other than as indicated above or in HPI.    Objective:   Vitals:   07/31/22 1450  BP: 138/78  Pulse: 67  Temp: 98.1 F (36.7 C)  SpO2: 100%  Weight: 230 lb (104.3 kg)  Height: '5\' 11"'$  (1.803 m)   {Vitals History (Optional):23777}  Physical Exam Vitals reviewed.  Constitutional:      Appearance: He is well-developed.  HENT:     Head: Normocephalic and atraumatic.     Right Ear: External ear normal.     Left Ear: External ear normal.  Eyes:     Conjunctiva/sclera: Conjunctivae normal.     Pupils: Pupils are equal, round, and reactive to light.  Neck:     Thyroid: No thyromegaly.  Cardiovascular:     Rate and Rhythm: Regular rhythm. Bradycardia present.     Heart sounds: Normal heart sounds.  Pulmonary:     Effort: Pulmonary effort is normal. No respiratory distress.     Breath sounds: Normal breath sounds. No wheezing.  Abdominal:     General: There is no distension.     Palpations: Abdomen is soft.     Tenderness: There is no abdominal tenderness.  Musculoskeletal:        General: No tenderness. Normal range of motion.     Cervical back: Normal range of motion and neck supple.     Comments:  Right upper extremity pain-free range of motion of wrist, elbow, no focal bony tenderness, healing abrasions over dorsal right forearm without  appreciable defect.  Lower extremities: 4/5  strength, slightly decreased strength with resisted knee flexion greater than extension on left.  Slight weakness of dorsiflexion at foot on left versus right but does have some dorsiflexion capability.  Appears to have equal plantarflexion bilaterally  Lymphadenopathy:     Cervical: No cervical adenopathy.  Skin:    General: Skin is warm and dry.  Neurological:     Mental Status: He is alert and oriented to person, place, and time.     Deep Tendon Reflexes: Reflexes are normal and symmetric.  Psychiatric:        Behavior: Behavior normal.     Assessment & Plan:  Thomas Harding is a 66 y.o. male . Annual physical exam  Need for influenza vaccination - Plan: Flu Vaccine QUAD High Dose(Fluad)  Numbness of foot  Left foot drop  Fall in home, initial encounter  Abrasion of right upper extremity, initial encounter  Spinal stenosis at L4-L5 level  History of lumbar laminectomy  PVC's (premature ventricular contractions)  Hyperglycemia - Plan: Hemoglobin A1c, CANCELED: Hemoglobin A1c  Mixed hyperlipidemia - Plan: CANCELED: Comprehensive metabolic panel, CANCELED: Lipid panel  OSA (obstructive sleep apnea)  Need for pneumococcal vaccine - Plan: Pneumococcal conjugate vaccine 20-valent (Prevnar 20)  Bradycardia - Plan: Basic metabolic panel, TSH  Fatigue, unspecified type - Plan: CBC, TSH   No orders of the defined types were placed in this encounter.  Patient Instructions  Call Dr. Tonita Cong for appointment soon to discuss leg weakness and falls. No ladders or work form elevated surfaces for now.  I am concerned about your low heart rate and fatigue. You may need to decrease your toprol dose- please call cardiology tomorrow morning and let them know about your symptoms and the low heart rates.  Please have labs performed at [facility] location when possible in the next few days.   Thanks for coming in today.   Return to the  clinic or go to the nearest emergency room if any of your symptoms worsen or new symptoms occur.  [facility] in 8:30-4:30 during weekdays, no appointment needed [address]  [place], [place] [id number]   Preventive Care 65 Years and Older, Male Preventive care refers to lifestyle choices and visits with your health care provider that can promote health and wellness. Preventive care visits are also called wellness exams. What can I expect for my preventive care visit? Counseling During your preventive care visit, your health care provider may ask about your: Medical history, including: Past medical problems. Family medical history. History of falls. Current health, including: Emotional well-being. Home life and relationship well-being. Sexual activity. Memory and ability to understand (cognition). Lifestyle, including: Alcohol, nicotine or tobacco, and drug use. Access to firearms. Diet, exercise, and sleep habits. Work and work Statistician. Sunscreen use. Safety issues such as seatbelt and bike helmet use. Physical exam Your health care provider will check your: Height and weight. These may be used to calculate your BMI (body mass index). BMI is a measurement that tells if you are at a healthy weight. Waist circumference. This measures the distance around your waistline. This measurement also tells if you are at a healthy weight and may help predict your risk of certain diseases, such as type 2 diabetes and high blood pressure. Heart rate and blood pressure. Body temperature. Skin for abnormal spots. What immunizations do I need?  Vaccines are usually given at various ages, according to a schedule. Your health care provider will recommend vaccines for you based on  your age, medical history, and lifestyle or other factors, such as travel or where you work. What tests do I need? Screening Your health care provider may recommend screening tests for certain conditions.  This may include: Lipid and cholesterol levels. Diabetes screening. This is done by checking your blood sugar (glucose) after you have not eaten for a while (fasting). Hepatitis C test. Hepatitis B test. HIV (human immunodeficiency virus) test. STI (sexually transmitted infection) testing, if you are at risk. Lung cancer screening. Colorectal cancer screening. Prostate cancer screening. Abdominal aortic aneurysm (AAA) screening. You may need this if you are a current or former smoker. Talk with your health care provider about your test results, treatment options, and if necessary, the need for more tests. Follow these instructions at home: Eating and drinking  Eat a diet that includes fresh fruits and vegetables, whole grains, lean protein, and low-fat dairy products. Limit your intake of foods with high amounts of sugar, saturated fats, and salt. Take vitamin and mineral supplements as recommended by your health care provider. Do not drink alcohol if your health care provider tells you not to drink. If you drink alcohol: Limit how much you have to 0-2 drinks a day. Know how much alcohol is in your drink. In the [place] one drink equals one 12 oz bottle of beer (355 mL), one 5 oz glass of wine (148 mL), or one 1 oz glass of hard liquor (44 mL). Lifestyle Brush your teeth every morning and night with fluoride toothpaste. Floss one time each day. Exercise for at least 30 minutes 5 or more days each week. Do not use any products that contain nicotine or tobacco. These products include cigarettes, chewing tobacco, and vaping devices, such as e-cigarettes. If you need help quitting, ask your health care provider. Do not use drugs. If you are sexually active, practice safe sex. Use a condom or other form of protection to prevent STIs. Take aspirin only as told by your health care provider. Make sure that you understand how much to take and what form to take. Work with your health care provider to  find out whether it is safe and beneficial for you to take aspirin daily. Ask your health care provider if you need to take a cholesterol-lowering medicine (statin). Find healthy ways to manage stress, such as: Meditation, yoga, or listening to music. Journaling. Talking to a trusted person. Spending time with friends and family. Safety Always wear your seat belt while driving or riding in a vehicle. Do not drive: If you have been drinking alcohol. Do not ride with someone who has been drinking. When you are tired or distracted. While texting. If you have been using any mind-altering substances or drugs. Wear a helmet and other protective equipment during sports activities. If you have firearms in your house, make sure you follow all gun safety procedures. Minimize exposure to UV radiation to reduce your risk of skin cancer. What's next? Visit your health care provider once a year for an annual wellness visit. Ask your health care provider how often you should have your eyes and teeth checked. Stay up to date on all vaccines. This information is not intended to replace advice given to you by your health care provider. Make sure you discuss any questions you have with your health care provider. Document Revised: 03/28/2021 Document Reviewed: 03/28/2021 Elsevier Patient Education  [place].         Signed,   Merri Ray, MD [facility] [place]  [facility] 07/31/22 4:51 PM

## 2022-08-01 ENCOUNTER — Telehealth: Payer: Self-pay | Admitting: Neurology

## 2022-08-01 NOTE — Telephone Encounter (Signed)
He can relay his concerns to the sleep technician at the time, they usually provide options for different interfaces and fit the patient with a mask at the beginning of the study.  Masks can also be switched during the study if need be.

## 2022-08-01 NOTE — Telephone Encounter (Signed)
CPAP- [employer] medicare no auth req.  Patient is scheduled at [facility]23[facility] at 9 pm.  Mailed packet to the patient.  He asked me what type of CPAP mask will he being wearing because he is very claustrophobic.

## 2022-08-02 ENCOUNTER — Ambulatory Visit: Payer: Medicare Other | Attending: Internal Medicine | Admitting: Internal Medicine

## 2022-08-02 ENCOUNTER — Encounter: Payer: Self-pay | Admitting: Internal Medicine

## 2022-08-02 ENCOUNTER — Telehealth: Payer: Self-pay | Admitting: Internal Medicine

## 2022-08-02 VITALS — BP 124/68 | HR 40 | Ht 71.0 in | Wt 231.0 lb

## 2022-08-02 DIAGNOSIS — I493 Ventricular premature depolarization: Secondary | ICD-10-CM

## 2022-08-02 NOTE — Telephone Encounter (Signed)
Patient called to report 11/15 would be a good date to schedule his ablation.

## 2022-08-02 NOTE — Patient Instructions (Signed)
Medication Instructions:  Your physician recommends that you continue on your current medications as directed. Please refer to the Current Medication list given to you today.  *If you need a refill on your cardiac medications before your next appointment, please call your pharmacy*   Lab Work: None ordered   Testing/Procedures: None ordered   Follow-Up: At [facility], you and your health needs are our priority.  As part of our continuing mission to provide you with exceptional heart care, we have created designated Provider Care Teams.  These Care Teams include your primary Cardiologist (physician) and Advanced Practice Providers (APPs -  Physician Assistants and Nurse Practitioners) who all work together to provide you with the care you need, when you need it.  Your next appointment:   To be  determined  The format for your next appointment:   In Person  Provider:   Cristopher Peru, MD    Thank you for choosing [facility]!!   Trinidad Curet, RN [phone]  Other Instructions  You can proceed with ablation vs Flecainide 75 mg twice daily.  Please call the office with your decision.     Cardiac Ablation Cardiac ablation is a procedure to destroy (ablate) some heart tissue that is sending bad signals. These bad signals cause problems in heart rhythm. The heart has many areas that make these signals. If there are problems in these areas, they can make the heart beat in a way that is not normal. Destroying some tissues can help make the heart rhythm normal. Tell your doctor about: Any allergies you have. All medicines you are taking. These include vitamins, herbs, eye drops, creams, and over-the-counter medicines. Any problems you or family members have had with medicines that make you fall asleep (anesthetics). Any blood disorders you have. Any surgeries you have had. Any medical conditions you have, such as kidney failure. Whether you are pregnant or may be  pregnant. What are the risks? This is a safe procedure. But problems may occur, including: Infection. Bruising and bleeding. Bleeding into the chest. Stroke or blood clots. Damage to nearby areas of your body. Allergies to medicines or dyes. The need for a pacemaker if the normal system is damaged. Failure of the procedure to treat the problem. What happens before the procedure? Medicines Ask your doctor about: Changing or stopping your normal medicines. This is important. Taking aspirin and ibuprofen. Do not take these medicines unless your doctor tells you to take them. Taking other medicines, vitamins, herbs, and supplements. General instructions Follow instructions from your doctor about what you cannot eat or drink. Plan to have someone take you home from the hospital or clinic. If you will be going home right after the procedure, plan to have someone with you for 24 hours. Ask your doctor what steps will be taken to prevent infection. What happens during the procedure?  An IV tube will be put into one of your veins. You will be given a medicine to help you relax. The skin on your neck or groin will be numbed. A cut (incision) will be made in your neck or groin. A needle will be put through your cut and into a large vein. A tube (catheter) will be put into the needle. The tube will be moved to your heart. Dye may be put through the tube. This helps your doctor see your heart. Small devices (electrodes) on the tube will send out signals. A type of energy will be used to destroy some heart tissue.  The tube will be taken out. Pressure will be held on your cut. This helps stop bleeding. A bandage will be put over your cut. The exact procedure may vary among doctors and hospitals. What happens after the procedure? You will be watched until you leave the hospital or clinic. This includes checking your heart rate, breathing rate, oxygen, and blood pressure. Your cut will be  watched for bleeding. You will need to lie still for a few hours. Do not drive for 24 hours or as long as your doctor tells you. Summary Cardiac ablation is a procedure to destroy some heart tissue. This is done to treat heart rhythm problems. Tell your doctor about any medical conditions you may have. Tell him or her about all medicines you are taking to treat them. This is a safe procedure. But problems may occur. These include infection, bruising, bleeding, and damage to nearby areas of your body. Follow what your doctor tells you about food and drink. You may also be told to change or stop some of your medicines. After the procedure, do not drive for 24 hours or as long as your doctor tells you. This information is not intended to replace advice given to you by your health care provider. Make sure you discuss any questions you have with your health care provider. Document Revised: 12/21/2021 Document Reviewed: 09/02/2019 Elsevier Patient Education  [place].     Flecainide Tablets What is this medication? FLECAINIDE (FLEK a nide) prevents and treats a fast or irregular heartbeat (arrhythmia). It is often used to treat a type of arrhythmia known as AFib (atrial fibrillation). It works by slowing down overactive electric signals in the heart, which stabilizes your heart rhythm. It belongs to a group of medications called antiarrhythmics. This medicine may be used for other purposes; ask your health care provider or pharmacist if you have questions. COMMON BRAND NAME(S): Tambocor What should I tell my care team before I take this medication? They need to know if you have any of these conditions: Abnormal levels of potassium in the blood Heart disease including heart rhythm and heart rate problems Kidney or liver disease Recent heart attack An unusual or allergic reaction to flecainide, local anesthetics, other medications, foods, dyes, or preservatives Pregnant or trying to get  pregnant Breast-feeding How should I use this medication? Take this medication by mouth with a glass of water. Follow the directions on the prescription label. You can take this medication with or without food. Take your doses at regular intervals. Do not take your medication more often than directed. Do not stop taking this medication suddenly. This may cause serious, heart-related side effects. If your care team wants you to stop the medication, the dose may be slowly lowered over time to avoid any side effects. Talk to your care team regarding the use of this medication in children. While this medication may be prescribed for children as young as 1 year of age for selected conditions, precautions do apply. Overdosage: If you think you have taken too much of this medicine contact a poison control center or emergency room at once. NOTE: This medicine is only for you. Do not share this medicine with others. What if I miss a dose? If you miss a dose, take it as soon as you can. If it is almost time for your next dose, take only that dose. Do not take double or extra doses. What may interact with this medication? Do not take this medication with any  of the following: Amoxapine Arsenic trioxide Certain antibiotics like clarithromycin, erythromycin, gatifloxacin, gemifloxacin, levofloxacin, moxifloxacin, sparfloxacin, or troleandomycin Certain antidepressants called tricyclic antidepressants like amitriptyline, imipramine, or nortriptyline Certain medications to control heart rhythm like disopyramide, encainide, moricizine, procainamide, propafenone, and quinidine Cisapride Delavirdine Droperidol Haloperidol [place] Imatinib Levomethadyl Maprotiline Medications for malaria like chloroquine and halofantrine Pentamidine Phenothiazines like chlorpromazine, mesoridazine, prochlorperazine, thioridazine Pimozide Quinine Ranolazine Ritonavir Sertindole This medication may also interact with the  following: Cimetidine Dofetilide Medications for angina or high blood pressure Medications to control heart rhythm like amiodarone and digoxin Ziprasidone This list may not describe all possible interactions. Give your health care provider a list of all the medicines, herbs, non-prescription drugs, or dietary supplements you use. Also tell them if you smoke, drink alcohol, or use illegal drugs. Some items may interact with your medicine. What should I watch for while using this medication? Visit your care team for regular checks on your progress. Because your condition and the use of this medication carries some risk, it is a good idea to carry an identification card, necklace or bracelet with details of your condition, medications, and care team. Check your blood pressure and pulse rate regularly. Ask your care team what your blood pressure and pulse rate should be, and when you should contact them. Your care team also may schedule regular blood tests and electrocardiograms to check your progress. You may get drowsy or dizzy. Do not drive, use machinery, or do anything that needs mental alertness until you know how this medication affects you. Do not stand or sit up quickly, especially if you are an older patient. This reduces the risk of dizzy or fainting spells. Alcohol can make you more dizzy, increase flushing and rapid heartbeats. Avoid alcoholic drinks. What side effects may I notice from receiving this medication? Side effects that you should report to your care team as soon as possible: Allergic reactions--skin rash, itching, hives, swelling of the face, lips, tongue, or throat Heart failure--shortness of breath, swelling of the ankles, feet, or hands, sudden weight gain, unusual weakness or fatigue Heart rhythm changes--fast or irregular heartbeat, dizziness, feeling faint or lightheaded, chest pain, trouble breathing Liver injury--right upper belly pain, loss of appetite, nausea,  light-colored stool, dark yellow or brown urine, yellowing skin or eyes, unusual weakness or fatigue Side effects that usually do not require medical attention (report to your care team if they continue or are bothersome): Blurry vision Constipation Dizziness Fatigue Headache Nausea Tremors or shaking This list may not describe all possible side effects. Call your doctor for medical advice about side effects. You may report side effects to FDA at 1-800-FDA-1088. Where should I keep my medication? Keep out of the reach of children and pets. Store at room temperature between 15 and 30 degrees C (59 and 86 degrees F). Protect from light. Keep container tightly closed. Throw away any unused medication after the expiration date. NOTE: This sheet is a summary. It may not cover all possible information. If you have questions about this medicine, talk to your doctor, pharmacist, or health care provider.  2023 Elsevier/Gold Standard (2007-11-21 00:00:00)    Important Information About Sugar

## 2022-08-02 NOTE — Progress Notes (Signed)
        HPI [name] is referred by Thomas Harding for evaluation of PVC's. He is a pleasant 66 yo man  with a h/o HTN, and cardiomyopathy who has undergone left heart cath demonstrating no obstructive CAD. He has an abnormal stress test with LV dysfunction. He had a cardiac monitor which demonstrated dense PVC's with a 24% burden. Review of the PVC morphology demonstrates an inferior axis with a left bundle pattern transitioning to positive at V3 suggesting either an RV or LV outflow tract morphology. He has been treated with toprol.      Allergies  Allergen Reactions   Bee Venom Anaphylaxis   Penicillins Hives and Swelling              Current Outpatient Medications  Medication Sig Dispense Refill   acetaminophen (TYLENOL) 325 MG tablet Take 650 mg by mouth every 6 (six) hours as needed for moderate pain.       amLODipine (NORVASC) 5 MG tablet Take 1 tablet (5 mg total) by mouth daily. 30 tablet 3   metoprolol succinate (TOPROL-XL) 50 MG 24 hr tablet Take 1 tablet (50 mg total) by mouth daily. Take with or immediately following a meal. 90 tablet 3   rosuvastatin (CRESTOR) 20 MG tablet Take 1 tablet (20 mg total) by mouth daily. 30 tablet 3    No current facility-administered medications for this visit.            Past Medical History:  Diagnosis Date   Hyperlipidemia     Leg pain     OSA (obstructive sleep apnea)     PVC (premature ventricular contraction)     Squamous cell carcinoma of skin 01/16/2017    in situ- right crown       ROS:    All systems reviewed and negative except as noted in the HPI.          Past Surgical History:  Procedure Laterality Date   LEFT HEART CATH AND CORONARY ANGIOGRAPHY N/A 05/14/2022    Procedure: LEFT HEART CATH AND CORONARY ANGIOGRAPHY;  Surgeon: Harding, Manish J, MD;  Location: [facility];  Service: Cardiovascular;  Laterality: N/A;   LUMBAR LAMINECTOMY/DECOMPRESSION MICRODISCECTOMY Left 05/18/2020    Procedure:  Microlumbar decompression Lumbar Four- Lumbar Five, Lumbar Five- Sacral One left;  Surgeon: Beane, Jeffrey, MD;  Location: [facility] OR;  Service: Orthopedics;  Laterality: Left;  Microlumbar decompression Lumbar Five- Sacral One left   ROTATOR CUFF REPAIR Right     thumb surgery Left     TONSILLECTOMY       VASECTOMY                 Family History  Problem Relation Age of Onset   Hypertension Mother     Hyperlipidemia Father     Sleep apnea Neg Hx          Social History         Socioeconomic History   Marital status: Married      Spouse name: Not on file   Number of children: 2   Years of education: Not on file   Highest education level: Not on file  Occupational History   Not on file  Tobacco Use   Smoking status: Never   Smokeless tobacco: Never  Vaping Use   Vaping Use: Never used  Substance and Sexual Activity   Alcohol use: Yes      Comment: occ   Drug use: No     Sexual activity: Yes  Other Topics Concern   Not on file  Social History Narrative   Not on file    Social Determinants of Health    Financial Resource Strain: Not on file  Food Insecurity: Not on file  Transportation Needs: Not on file  Physical Activity: Not on file  Stress: Not on file  Social Connections: Not on file  Intimate Partner Violence: Not on file        BP 124/68   Pulse (!) 40   Ht 5' 11" (1.803 m)   Wt 231 lb (104.8 kg)   SpO2 98%   BMI 32.22 kg/m    Physical Exam:   Well appearing 66 yo man, NAD HEENT: Unremarkable Neck:  No JVD, no thyromegally Lymphatics:  No adenopathy Back:  No CVA tenderness Lungs:  Clear HEART:  Regular rate rhythm, no murmurs, no rubs, no clicks Abd:  soft, positive bowel sounds, no organomegally, no rebound, no guarding Ext:  2 plus pulses, no edema, no cyanosis, no clubbing Skin:  No rashes no nodules Neuro:  CN II through XII intact, motor grossly intact   EKG   DEVICE  Normal device function.  See PaceArt for details.    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 I discussed AA drug therapy as well as catheter ablation. The pro's and con's and risks/benefits/goals/expectations of the procedure were reviewed and he  will call us if he would like to proceed with ablation vs AA drug therapy with flecainide. LV dysfunction - this was demonstrated by myoview but not by echo.      Thomas Brashier,MD  

## 2022-08-05 DIAGNOSIS — M5459 Other low back pain: Secondary | ICD-10-CM | POA: Diagnosis not present

## 2022-08-05 DIAGNOSIS — M25562 Pain in left knee: Secondary | ICD-10-CM | POA: Diagnosis not present

## 2022-08-08 ENCOUNTER — Other Ambulatory Visit (INDEPENDENT_AMBULATORY_CARE_PROVIDER_SITE_OTHER): Payer: Medicare Other

## 2022-08-08 DIAGNOSIS — R5383 Other fatigue: Secondary | ICD-10-CM

## 2022-08-08 DIAGNOSIS — R739 Hyperglycemia, unspecified: Secondary | ICD-10-CM | POA: Diagnosis not present

## 2022-08-08 DIAGNOSIS — R001 Bradycardia, unspecified: Secondary | ICD-10-CM | POA: Diagnosis not present

## 2022-08-08 LAB — BASIC METABOLIC PANEL
BUN: 25 mg/dL — ABNORMAL HIGH (ref 6–23)
CO2: 31 mEq/L (ref 19–32)
Calcium: 9.3 mg/dL (ref 8.4–10.5)
Chloride: 101 mEq/L (ref 96–112)
Creatinine, Ser: 1.33 mg/dL (ref 0.40–1.50)
GFR: 55.99 mL/min — ABNORMAL LOW (ref >60.00)
Glucose, Bld: 198 mg/dL — ABNORMAL HIGH (ref 70–99)
Potassium: 3.9 mEq/L (ref 3.5–5.1)
Sodium: 140 mEq/L (ref 135–145)

## 2022-08-08 LAB — CBC
HCT: 42.4 % (ref 39.0–52.0)
Hemoglobin: 14.1 g/dL (ref 13.0–17.0)
MCHC: 33.3 g/dL (ref 30.0–36.0)
MCV: 87.6 fl (ref 78.0–100.0)
Platelets: 197 10*3/uL (ref 150.0–400.0)
RBC: 4.84 Mil/uL (ref 4.22–5.81)
RDW: 12.9 % (ref 11.5–15.5)
WBC: 7.9 10*3/uL (ref 4.0–10.5)

## 2022-08-08 LAB — HEMOGLOBIN A1C: Hgb A1c MFr Bld: 6.1 % (ref 4.6–6.5)

## 2022-08-08 LAB — TSH: TSH: 1.42 u[IU]/mL (ref 0.35–5.50)

## 2022-08-08 NOTE — Telephone Encounter (Signed)
CARTO but no to anesthesia.

## 2022-08-12 DIAGNOSIS — M5417 Radiculopathy, lumbosacral region: Secondary | ICD-10-CM | POA: Diagnosis not present

## 2022-08-13 NOTE — Telephone Encounter (Signed)
PT CALLED, PROCEDURE SCHEDULED 09/11/22 AT 230 PM.    PT WILL HAVE LABS DRAWN ON WEEK OF 11/20.  CARTO REQUESTED, NO ANESTHESIA.   CBC AND BMET ORDERS ENTERED.

## 2022-08-14 ENCOUNTER — Other Ambulatory Visit: Payer: Self-pay | Admitting: Cardiology

## 2022-08-14 DIAGNOSIS — R0789 Other chest pain: Secondary | ICD-10-CM

## 2022-08-17 ENCOUNTER — Encounter: Payer: Self-pay | Admitting: Cardiology

## 2022-08-19 ENCOUNTER — Other Ambulatory Visit: Payer: Self-pay | Admitting: Cardiology

## 2022-08-19 DIAGNOSIS — R0609 Other forms of dyspnea: Secondary | ICD-10-CM

## 2022-08-19 MED ORDER — AMLODIPINE BESYLATE 5 MG PO TABS: 5.0000 mg | ORAL_TABLET | Freq: Every day | ORAL | 3 refills | Status: DC

## 2022-08-19 NOTE — Telephone Encounter (Signed)
I will check with Dr. Lovena Le re: use of steroid and ablation.  I will send amlodipine refill.  Thanks MJP

## 2022-08-23 DIAGNOSIS — M5416 Radiculopathy, lumbar region: Secondary | ICD-10-CM | POA: Diagnosis not present

## 2022-08-23 DIAGNOSIS — M25562 Pain in left knee: Secondary | ICD-10-CM | POA: Diagnosis not present

## 2022-08-29 ENCOUNTER — Ambulatory Visit: Payer: Medicare Other | Admitting: Cardiology

## 2022-09-02 ENCOUNTER — Ambulatory Visit: Payer: Medicare Other | Attending: Internal Medicine

## 2022-09-02 DIAGNOSIS — I493 Ventricular premature depolarization: Secondary | ICD-10-CM | POA: Diagnosis not present

## 2022-09-02 DIAGNOSIS — M25562 Pain in left knee: Secondary | ICD-10-CM | POA: Diagnosis not present

## 2022-09-02 DIAGNOSIS — M5417 Radiculopathy, lumbosacral region: Secondary | ICD-10-CM | POA: Diagnosis not present

## 2022-09-02 DIAGNOSIS — M5459 Other low back pain: Secondary | ICD-10-CM | POA: Diagnosis not present

## 2022-09-02 DIAGNOSIS — M545 Low back pain, unspecified: Secondary | ICD-10-CM | POA: Diagnosis not present

## 2022-09-03 LAB — BASIC METABOLIC PANEL
BUN/Creatinine Ratio: 13 (ref 10–24)
BUN: 17 mg/dL (ref 8–27)
CO2: 26 mmol/L (ref 20–29)
Calcium: 10.5 mg/dL — ABNORMAL HIGH (ref 8.6–10.2)
Chloride: 98 mmol/L (ref 96–106)
Creatinine, Ser: 1.32 mg/dL — ABNORMAL HIGH (ref 0.76–1.27)
Glucose: 120 mg/dL — ABNORMAL HIGH (ref 70–99)
Potassium: 4.6 mmol/L (ref 3.5–5.2)
Sodium: 139 mmol/L (ref 134–144)
eGFR: 60 mL/min/{1.73_m2} (ref >59)

## 2022-09-03 LAB — CBC WITH DIFFERENTIAL/PLATELET
Basophils Absolute: 0.1 10*3/uL (ref 0.0–0.2)
Basos: 1 %
EOS (ABSOLUTE): 0.1 10*3/uL (ref 0.0–0.4)
Eos: 1 %
Hematocrit: 43.9 % (ref 37.5–51.0)
Hemoglobin: 14.8 g/dL (ref 13.0–17.7)
Immature Grans (Abs): 0 10*3/uL (ref 0.0–0.1)
Immature Granulocytes: 0 %
Lymphocytes Absolute: 1 10*3/uL (ref 0.7–3.1)
Lymphs: 12 %
MCH: 29.5 pg (ref 26.6–33.0)
MCHC: 33.7 g/dL (ref 31.5–35.7)
MCV: 88 fL (ref 79–97)
Monocytes Absolute: 0.2 10*3/uL (ref 0.1–0.9)
Monocytes: 2 %
Neutrophils Absolute: 7.2 10*3/uL — ABNORMAL HIGH (ref 1.4–7.0)
Neutrophils: 84 %
Platelets: 216 10*3/uL (ref 150–450)
RBC: 5.01 x10E6/uL (ref 4.14–5.80)
RDW: 12.1 % (ref 11.6–15.4)
WBC: 8.5 10*3/uL (ref 3.4–10.8)

## 2022-09-10 ENCOUNTER — Telehealth: Payer: Self-pay | Admitting: Internal Medicine

## 2022-09-10 NOTE — Pre-Procedure Instructions (Signed)
Instructed patient on the following items: Arrival time 1230 Nothing to eat or drink after midnight No meds AM of procedure Responsible person to drive you home and stay with you for 24 hrs

## 2022-09-10 NOTE — Telephone Encounter (Signed)
Spoke to pt and went over procedure Instructions. Explained to him how to find them in his MyChart.

## 2022-09-10 NOTE — Telephone Encounter (Signed)
Pt would like a callback regarding Ablation for tomorrow. Please advise

## 2022-09-11 ENCOUNTER — Ambulatory Visit (HOSPITAL_COMMUNITY)
Admission: RE | Admit: 2022-09-11 | Discharge: 2022-09-12 | Disposition: A | Discharge status: Home / Self Care | Payer: Medicare Other | Attending: Internal Medicine | Admitting: Internal Medicine

## 2022-09-11 ENCOUNTER — Ambulatory Visit (HOSPITAL_COMMUNITY)
Admission: RE | Disposition: A | Discharge status: Home / Self Care | Payer: Medicare Other | Source: Home / Self Care | Attending: Internal Medicine

## 2022-09-11 ENCOUNTER — Other Ambulatory Visit: Payer: Self-pay

## 2022-09-11 DIAGNOSIS — I429 Cardiomyopathy, unspecified: Secondary | ICD-10-CM | POA: Insufficient documentation

## 2022-09-11 DIAGNOSIS — Z8679 Personal history of other diseases of the circulatory system: Secondary | ICD-10-CM | POA: Insufficient documentation

## 2022-09-11 DIAGNOSIS — I493 Ventricular premature depolarization: Secondary | ICD-10-CM | POA: Diagnosis not present

## 2022-09-11 HISTORY — PX: PVC ABLATION: EP1236

## 2022-09-11 LAB — POCT ACTIVATED CLOTTING TIME
Activated Clotting Time: 212 seconds
Activated Clotting Time: 244 seconds

## 2022-09-11 SURGERY — PVC ABLATION

## 2022-09-11 MED ORDER — LIDOCAINE HCL (PF) 1 % IJ SOLN
INTRAMUSCULAR | Status: AC
  Filled 2022-09-11: qty 30

## 2022-09-11 MED ORDER — PROTAMINE SULFATE 10 MG/ML IV SOLN
INTRAVENOUS | Status: AC
  Filled 2022-09-11: qty 5

## 2022-09-11 MED ORDER — HEPARIN SODIUM (PORCINE) 1000 UNIT/ML IJ SOLN
INTRAMUSCULAR | Status: AC
  Filled 2022-09-11: qty 10

## 2022-09-11 MED ORDER — SODIUM CHLORIDE 0.9 % IV SOLN: INTRAVENOUS | Status: DC

## 2022-09-11 MED ORDER — BUPIVACAINE HCL (PF) 0.25 % IJ SOLN
INTRAMUSCULAR | Status: AC
  Filled 2022-09-11: qty 30

## 2022-09-11 MED ORDER — FENTANYL CITRATE (PF) 100 MCG/2ML IJ SOLN
INTRAMUSCULAR | Status: AC
  Filled 2022-09-11: qty 2

## 2022-09-11 MED ORDER — PROTAMINE SULFATE 10 MG/ML IV SOLN
INTRAVENOUS | Status: DC | PRN
  Administered 2022-09-11: 5 mg via INTRAVENOUS
  Administered 2022-09-11: 10 mg via INTRAVENOUS
  Administered 2022-09-11: 5 mg via INTRAVENOUS
  Administered 2022-09-11: 10 mg via INTRAVENOUS

## 2022-09-11 MED ORDER — MIDAZOLAM HCL 5 MG/5ML IJ SOLN
INTRAMUSCULAR | Status: AC
  Filled 2022-09-11: qty 5

## 2022-09-11 MED ORDER — HEPARIN SODIUM (PORCINE) 1000 UNIT/ML IJ SOLN
INTRAMUSCULAR | Status: DC | PRN
  Administered 2022-09-11: 1000 [IU] via INTRAVENOUS
  Administered 2022-09-11: 8000 [IU] via INTRAVENOUS
  Administered 2022-09-11: 5000 [IU] via INTRAVENOUS

## 2022-09-11 MED ORDER — FENTANYL CITRATE (PF) 100 MCG/2ML IJ SOLN
INTRAMUSCULAR | Status: DC | PRN
  Administered 2022-09-11 (×3): 12.5 ug via INTRAVENOUS

## 2022-09-11 MED ORDER — ACETAMINOPHEN 325 MG PO TABS: 650.0000 mg | ORAL_TABLET | ORAL | Status: DC | PRN

## 2022-09-11 MED ORDER — BUPIVACAINE HCL (PF) 0.25 % IJ SOLN
INTRAMUSCULAR | Status: DC | PRN
  Administered 2022-09-11: 30 mL

## 2022-09-11 MED ORDER — HEPARIN (PORCINE) IN NACL 1000-0.9 UT/500ML-% IV SOLN
INTRAVENOUS | Status: DC | PRN
  Administered 2022-09-11 (×2): 500 mL

## 2022-09-11 MED ORDER — FENTANYL CITRATE (PF) 100 MCG/2ML IJ SOLN
25.0000 ug | Freq: Once | INTRAMUSCULAR | Status: AC
  Administered 2022-09-11: 25 ug via INTRAVENOUS

## 2022-09-11 MED ORDER — MIDAZOLAM HCL 5 MG/5ML IJ SOLN
INTRAMUSCULAR | Status: DC | PRN
  Administered 2022-09-11 (×3): 1 mg via INTRAVENOUS

## 2022-09-11 MED ORDER — ONDANSETRON HCL 4 MG/2ML IJ SOLN: 4.0000 mg | Freq: Four times a day (QID) | INTRAMUSCULAR | Status: DC | PRN

## 2022-09-11 SURGICAL SUPPLY — 10 items
CATH JOSEPH QUAD ALLRED 6F REP (CATHETERS) IMPLANT
CATH SMTCH THERMOCOOL SF DF (CATHETERS) IMPLANT
PACK EP LATEX FREE (CUSTOM PROCEDURE TRAY) ×1
PACK EP LF (CUSTOM PROCEDURE TRAY) ×1 IMPLANT
PAD DEFIB RADIO PHYSIO CONN (PAD) ×1 IMPLANT
PATCH CARTO3 (PAD) IMPLANT
SHEATH PINNACLE 6F 10CM (SHEATH) IMPLANT
SHEATH PINNACLE 7F 10CM (SHEATH) IMPLANT
SHEATH PINNACLE 8F 10CM (SHEATH) IMPLANT
TUBING SMART ABLATE COOLFLOW (TUBING) IMPLANT

## 2022-09-11 NOTE — Progress Notes (Addendum)
SITE AREA: right groin/femoral  SITE PRIOR TO REMOVAL:  LEVEL 0  PRESSURE APPLIED FOR: approximately 20 minutes for arterial sheath and approximately 10 minutes for veinous sheath x3   MANUAL: yes  PATIENT STATUS DURING PULL: stable  POST PULL SITE:  LEVEL 0  POST PULL INSTRUCTIONS GIVEN: yes  POST PULL PULSES PRESENT: bilateral pedal pulses at +2  DRESSING APPLIED: gauze with tegaderm  BEDREST BEGINS @ 1914  COMMENTS: arterial sheath removed 1st, veinous sheath x3 removed 2nd

## 2022-09-11 NOTE — H&P (Signed)
HPI Thomas Harding is referred by Dr. Virgina Jock for evaluation of PVC's. He is a pleasant 66 yo man  with a h/o HTN, and cardiomyopathy who has undergone left heart cath demonstrating no obstructive CAD. He has an abnormal stress test with LV dysfunction. He had a cardiac monitor which demonstrated dense PVC's with a 24% burden. Review of the PVC morphology demonstrates an inferior axis with a left bundle pattern transitioning to positive at V3 suggesting either an RV or LV outflow tract morphology. He has been treated with toprol.      Allergies  Allergen Reactions   Bee Venom Anaphylaxis   Penicillins Hives and Swelling              Current Outpatient Medications  Medication Sig Dispense Refill   acetaminophen (TYLENOL) 325 MG tablet Take 650 mg by mouth every 6 (six) hours as needed for moderate pain.       amLODipine (NORVASC) 5 MG tablet Take 1 tablet (5 mg total) by mouth daily. 30 tablet 3   metoprolol succinate (TOPROL-XL) 50 MG 24 hr tablet Take 1 tablet (50 mg total) by mouth daily. Take with or immediately following a meal. 90 tablet 3   rosuvastatin (CRESTOR) 20 MG tablet Take 1 tablet (20 mg total) by mouth daily. 30 tablet 3    No current facility-administered medications for this visit.            Past Medical History:  Diagnosis Date   Hyperlipidemia     Leg pain     OSA (obstructive sleep apnea)     PVC (premature ventricular contraction)     Squamous cell carcinoma of skin 01/16/2017    in situ- right crown       ROS:    All systems reviewed and negative except as noted in the HPI.          Past Surgical History:  Procedure Laterality Date   LEFT HEART CATH AND CORONARY ANGIOGRAPHY N/A 05/14/2022    Procedure: LEFT HEART CATH AND CORONARY ANGIOGRAPHY;  Surgeon: Nigel Mormon, MD;  Location: [facility];  Service: Cardiovascular;  Laterality: N/A;   LUMBAR LAMINECTOMY/DECOMPRESSION MICRODISCECTOMY Left 05/18/2020    Procedure:  Microlumbar decompression Lumbar Four- Lumbar Five, Lumbar Five- Sacral One left;  Surgeon: Susa Day, MD;  Location: [facility];  Service: Orthopedics;  Laterality: Left;  Microlumbar decompression Lumbar Five- Sacral One left   ROTATOR CUFF REPAIR Right     thumb surgery Left     TONSILLECTOMY       VASECTOMY                 Family History  Problem Relation Age of Onset   Hypertension Mother     Hyperlipidemia Father     Sleep apnea Neg Hx          Social History         Socioeconomic History   Marital status: Married      Spouse name: Not on file   Number of children: 2   Years of education: Not on file   Highest education level: Not on file  Occupational History   Not on file  Tobacco Use   Smoking status: Never   Smokeless tobacco: Never  Vaping Use   Vaping Use: Never used  Substance and Sexual Activity   Alcohol use: Yes      Comment: occ   Drug use: No  Sexual activity: Yes  Other Topics Concern   Not on file  Social History Narrative   Not on file    Social Determinants of Health    Financial Resource Strain: Not on file  Food Insecurity: Not on file  Transportation Needs: Not on file  Physical Activity: Not on file  Stress: Not on file  Social Connections: Not on file  Intimate Partner Violence: Not on file        BP 124/68   Pulse (!) 40   Ht '5\' 11"'$  (1.803 m)   Wt 231 lb (104.8 kg)   SpO2 98%   BMI 32.22 kg/m    Physical Exam:   Well appearing 66 yo man, NAD HEENT: Unremarkable Neck:  No JVD, no thyromegally Lymphatics:  No adenopathy Back:  No CVA tenderness Lungs:  Clear HEART:  Regular rate rhythm, no murmurs, no rubs, no clicks Abd:  soft, positive bowel sounds, no organomegally, no rebound, no guarding Ext:  2 plus pulses, no edema, no cyanosis, no clubbing Skin:  No rashes no nodules Neuro:  CN II through XII intact, motor grossly intact   EKG   DEVICE  Normal device function.  See PaceArt for details.    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 I discussed AA drug therapy as well as catheter ablation. The pro's and con's and risks/benefits/goals/expectations of the procedure were reviewed and he  will call us if he would like to proceed with ablation vs AA drug therapy with flecainide. LV dysfunction - this was demonstrated by myoview but not by echo.      Thomas Overlie Jade Burright,MD

## 2022-09-12 ENCOUNTER — Encounter (HOSPITAL_COMMUNITY): Payer: Self-pay | Admitting: Internal Medicine

## 2022-09-12 DIAGNOSIS — I429 Cardiomyopathy, unspecified: Secondary | ICD-10-CM | POA: Diagnosis not present

## 2022-09-12 DIAGNOSIS — Z8679 Personal history of other diseases of the circulatory system: Secondary | ICD-10-CM | POA: Diagnosis not present

## 2022-09-12 DIAGNOSIS — I493 Ventricular premature depolarization: Secondary | ICD-10-CM | POA: Diagnosis not present

## 2022-09-12 MED ORDER — SODIUM CHLORIDE 0.9 % IV SOLN: 250.0000 mL | INTRAVENOUS | Status: DC | PRN

## 2022-09-12 MED ORDER — SODIUM CHLORIDE 0.9% FLUSH: 3.0000 mL | Freq: Two times a day (BID) | INTRAVENOUS | Status: DC

## 2022-09-12 MED ORDER — SODIUM CHLORIDE 0.9% FLUSH: 3.0000 mL | INTRAVENOUS | Status: DC | PRN

## 2022-09-12 MED FILL — Fentanyl Citrate Preservative Free (PF) Inj 100 MCG/2ML: INTRAMUSCULAR | Qty: 2 | Status: AC

## 2022-09-12 MED FILL — Bupivacaine HCl Preservative Free (PF) Inj 0.25%: INTRAMUSCULAR | Qty: 30 | Status: AC

## 2022-09-12 NOTE — TOC Transition Note (Signed)
Transition of Care [facility]) - CM/SW Discharge Note   Patient Details  Name: Thomas Harding MRN: [id number] Date of Birth: 1956-10-02  [facility] CM/SW Contact:  Zenon Mayo, RN Phone Number: 09/12/2022, 10:11 AM   Clinical Narrative:    Patient is for dc  today, he has no needs. Wife Is at the bedside and will transport him home today.          Patient Goals and CMS Choice        Discharge Placement                       Discharge Plan and Services                                     Social Determinants of Health (SDOH) Interventions     Readmission Risk Interventions     No data to display

## 2022-09-12 NOTE — Discharge Summary (Addendum)
ELECTROPHYSIOLOGY PROCEDURE DISCHARGE SUMMARY    Patient ID: Thomas Harding,  MRN: [id number], DOB/AGE: [birth year] 65 y.o.  Admit date: 09/11/2022 Discharge date: 09/12/2022  Primary Care Physician: [name], MD  Primary Cardiologist: None  Electrophysiologist: Dr. Lovena Le   Primary Discharge Diagnosis:  PVCs  Secondary Discharge Diagnosis:  LV dysfunction ( on Myoview but not echo)  Procedures This Admission:  1.  Electrophysiology study and radiofrequency catheter ablation of PVCs on 09/11/2022 by Dr. Lovena Le .  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     Brief HPI: Thomas Harding is a 66 y.o. male with a history of PVCs.  Risks, benefits, and alternatives to catheter ablation of PVCs were reviewed with the patient who wished to proceed.   Hospital Course:  The patient was admitted and underwent EPS/RFCA of PVCs with details as outlined above.  They were monitored on telemetry overnight which demonstrated re-occurrence of PVCs, though burden decreased.  Groin was without complication on the day of discharge.  The patient was examined and considered to be stable for discharge.  Wound care and restrictions were reviewed with the patient.  The patient will be seen back by EP APP in 2-3 weeks per Dr. Lovena Le for [place] patch to re-quantify PVCs.  Potentially could consider flecainide with no CAD on cath, though may need to update Echo as well.   Physical Exam: Vitals:   09/12/22 0024 09/12/22 0421 09/12/22 0700 09/12/22 0730  BP: 107/74 122/69 100/61 (!) 147/76  Pulse: 93 71 63 63  Resp: '18 18  18  '$ Temp: 98 F (36.7 C) 98.7 F (37.1 C)    TempSrc: Oral Oral    SpO2: 97% 97% 94% 96%  Weight:  100.9 kg    Height:        GEN- The patient is well appearing, alert and oriented x 3 today.   HEENT: normocephalic, atraumatic; sclera clear, conjunctiva pink; hearing intact; oropharynx clear; neck supple  Lungs- Clear to  ausculation bilaterally, normal work of breathing.  No wheezes, rales, rhonchi Heart- Regular rate and rhythm, no murmurs, rubs or gallops  GI- soft, non-tender, non-distended, bowel sounds present  Extremities- no clubbing, cyanosis, or edema; DP/PT/radial pulses 2+ bilaterally, groin without hematoma/bruit MS- no significant deformity or atrophy Skin- warm and dry, no rash or lesion Psych- euthymic mood, full affect Neuro- strength and sensation are intact   Labs:   Lab Results  Component Value Date   WBC 8.5 09/02/2022   HGB 14.8 09/02/2022   HCT 43.9 09/02/2022   MCV 88 09/02/2022   PLT 216 09/02/2022   No results for input(s): "NA", "K", "CL", "CO2", "BUN", "CREATININE", "CALCIUM", "PROT", "BILITOT", "ALKPHOS", "ALT", "AST", "GLUCOSE" in the last 168 hours.  Invalid input(s): "LABALBU"   Discharge Medications:  Allergies as of 09/12/2022       Reactions   Bee Venom Anaphylaxis   Penicillins Hives, Swelling        Medication List     TAKE these medications    acetaminophen 500 MG tablet Commonly known as: TYLENOL Take 1,000 mg by mouth daily as needed for moderate pain.   Alpha-Lipoic Acid 600 MG Caps Take 1,200 mg by mouth daily.   amLODipine 5 MG tablet Commonly known as: NORVASC Take 1 tablet (5 mg total) by mouth daily.   metoprolol succinate 50 MG 24 hr tablet Commonly known as: TOPROL-XL Take 1 tablet (50 mg total) by mouth daily. Take with or  immediately following a meal.   OVER THE COUNTER MEDICATION Take 2 capsules by mouth daily. H.A. Joint formula   rosuvastatin 20 MG tablet Commonly known as: CRESTOR TAKE 1 TABLET BY MOUTH DAILY        Disposition:    Follow-up Information     [facility] [place] Follow up.   Why: on 12/15 at 1005 for post PVC ablation follow up and monitor Contact information: [place] [id number] [phone]                 Duration of Discharge Encounter: Greater than 30 minutes including physician time.  Jacalyn Lefevre, PA-C  09/12/2022 9:30 AM  EP Attending  Patient seen and examined. Agree with above. The patient is doing well after PVC ablation. Last night he had some recurrent PVC's despite having none for over 30 minutes after the ablation. He will be discharged home with a 3 day zio monitor to see if his burden is better. Additional medical recs will be based on this and his symptoms.  Carleene Overlie Nadira Single,MD

## 2022-09-12 NOTE — Progress Notes (Signed)
Explained discharge instructions to patient. Reviewed follow up appointment and next medication administration times. Also reviewed education. Patient verbalized having an understanding for instructions given. All belongings are in the patient's possession to include glasses, cell phone and wallet. IV and telemetry were removed from by the floor staff. CCMD was notified. No other needs verbalized. Transported downstairs for discharge.

## 2022-09-12 NOTE — Discharge Instructions (Signed)

## 2022-09-16 ENCOUNTER — Encounter: Payer: Self-pay | Admitting: Cardiology

## 2022-09-16 ENCOUNTER — Ambulatory Visit: Payer: Medicare Other | Admitting: Cardiology

## 2022-09-16 VITALS — BP 137/80 | HR 60 | Resp 16 | Ht 71.0 in | Wt 226.0 lb

## 2022-09-16 DIAGNOSIS — I493 Ventricular premature depolarization: Secondary | ICD-10-CM

## 2022-09-16 NOTE — Progress Notes (Signed)
Patient referred by [name], MD for bradycardia  Subjective:   Thomas Harding, male    DOB: 1956/08/04, 66 y.o.   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  Patient underwent PVC ablation on 09/12/2022 given symptomatic PVC and PVC burden 24%. He has had some PVC's post ablation, but has had improvement in symptoms since then. Dr. Lovena Le will place him on a 3 day monitor soon.    Current Outpatient Medications:    acetaminophen (TYLENOL) 500 MG tablet, Take 1,000 mg by mouth daily as needed for moderate pain., Disp: , Rfl:    Alpha-Lipoic Acid 600 MG CAPS, Take 1,200 mg by mouth daily., Disp: , Rfl:    amLODipine (NORVASC) 5 MG tablet, Take 1 tablet (5 mg total) by mouth daily., Disp: 90 tablet, Rfl: 3   metoprolol succinate (TOPROL-XL) 50 MG 24 hr tablet, Take 1 tablet (50 mg total) by mouth daily. Take with or immediately following a meal., Disp: 90 tablet, Rfl: 3   OVER THE COUNTER MEDICATION, Take 2 capsules by mouth daily. H.A. Joint formula, Disp: , Rfl:    rosuvastatin (CRESTOR) 20 MG tablet, TAKE 1 TABLET BY MOUTH DAILY, Disp: 30 tablet, Rfl: 3  Cardiovascular and other pertinent studies:  PVC ablation 09/11/2022: Successful EPS/RFA of frequent PVC's originating from the left coronary cusp.    Mobile cardiac telemetry 7 days 05/15/2022 - 05/22/2022: Dominant rhythm: Sinus. HR 46-124 bpm. Avg HR 65 bpm, in sinus rhythm. 4 episodes of atrial tachycardia, fastest at 150 bpm for 4 beats, longest for 6 beats at 93 bpm. <1% isolated SVE, couplets. 1 episodes of VT, fastest at 164 bpm for 4 beats. 24% isolated VE, <1% couplet/triplets. No atrial fibrillation/atrial flutter/VT/high grade AV block, sinus pause >3sec noted. 3 patient triggered events, correlated with SVE, VE.     Coronary angiography 05/14/2022: Right dominant circulation No angiographic evidence  of coronary artery disease Slightly sluggish flow likely due to frequent PVCs/ventricular bigeminy Will consider EP referral   EKG 04/26/2022: Sinus rhythm 77 bpm Frequent ectopic ventricular beats   Exercise Tetrofosmin stress test 04/08/2022: Exercise nuclear stress test was performed using Bruce protocol. Patient reached 8 METS, and 98% of age predicted maximum heart rate. Exercise capacity was fair. No chest pain reported. Heart rate and hemodynamic response were normal. Stress EKG revealed no ischemic changes. Stress EKG showed sinus tachycardia, frequent PVC's, 1.5 mm horizontal ST depression in leads V4-V6 with normalization within 1 min to recovery, near resolution of frequent PVC's within 2 min into recovery. Stress EKG is equivocal for ischemia, but significant for exercise induced PVC's. SPECT images show small sized, mild intensity, reversible perfusion defect in basal inferoseptal myocardium. Mild global decrease in left ventricle wall motion and thickening. Stress LVEF 41%. LVEF estimation could be affected by frequent PVC's. Intermediate risk study.   CT cardiac scoring 03/14/2022: Left Main: 0 LAD: 54 LCx: 2.21 RCA: 13.1   Total Agatston Score: 69.3 MESA database percentile: 49  Lower Extremity Arterial Duplex 02/07/2022:  No hemodynamically significant stenoses are identified in the right lower  extremity arterial system. No hemodynamically significant stenoses are  identified in the left lower extremity arterial system.  This exam reveals normal perfusion of the right lower extremity (ABI 1.14)  and normal perfusion of the left lower extremity (ABI 1.09). Minimal  homogeneous plaque noted in bilateral lower extremity. Normal triphasic  waveform at the ankles.    EKG 01/31/2022: Sinus rhythm 57 bpm Low voltage in precordial leads   Mobile cardiac telemetry 7 days 06/26/2021 - 07/03/2021: Dominant rhythm: Sinus. HR 48-134 bpm. Avg HR 69 bpm. 2 episodes of probable  atrial tachycardia, fastest at 116 bpm for 6 beats, longest for 31.6 secs at 91 bpm. <1% isolated SVE, couplet/triplets. 0 episodes of VT 1.2% isolated VE, <1 couplet/triplets. No atrial fibrillation/atrial flutter/VT/high grade AV block, sinus pause >3sec noted. 0 patient triggered events.   Echocardiogram 06/26/2021:  Left ventricle cavity is normal in size and wall thickness. Normal global  wall motion. Normal LV systolic function with EF 60%. Normal diastolic  filling pattern.  No significant valvular abnormality.  No evidence of pulmonary hypertension.  No significant change compared to previous study in 2017.  Recent labs: 02/25/2022: Glucose 99, BUN/Cr 14/1.21. EGFR 62. Na/K 139/4.6. Rest of the CMP normal CBC NA HbA1C NA Chol 172, TG 324, HDL 38, dLDL 95 TSH NA  02/15/2021: Glucose 111, BUN/Cr 20/1.23. EGFR 62. Na/K 140/4.2. Rest of the CMP normal H/H 13/40. MCV 87. Platelets 192 Chol 179, TG 139, HDL 39, LDL 102   Review of Systems  Cardiovascular:  Negative for chest pain, dyspnea on exertion, leg swelling, palpitations and syncope.         Vitals:   09/16/22 1442  BP: 137/80  Pulse: 60  Resp: 16  SpO2: 97%     Body mass index is 31.52 kg/m. Filed Weights   09/16/22 1442  Weight: 226 lb (102.5 kg)     Objective:   Physical Exam Vitals and nursing note reviewed.  Constitutional:      General: He is not in acute distress. Neck:     Vascular: No JVD.  Cardiovascular:     Rate and Rhythm: Normal rate and regular rhythm. Frequent Extrasystoles are present.    Heart sounds: Normal heart sounds. No murmur heard. Pulmonary:     Effort: Pulmonary effort is normal.     Breath sounds: Normal breath sounds. No wheezing or rales.  Musculoskeletal:     Right lower leg: No edema.     Left lower leg: No edema.          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  Symptomatic PVCs: Now s/p PVC ablation on  09/12/2022. Improvement in symptoms. He will have 3 day monitor with Dr. Lovena Le.  I will see him on as needed basis.    F/u in 3 months   Nigel Mormon, MD Pager: [phone] Office: [phone]

## 2022-09-23 ENCOUNTER — Ambulatory Visit: Payer: BLUE CROSS/BLUE SHIELD | Admitting: Dermatology

## 2022-09-25 ENCOUNTER — Ambulatory Visit (INDEPENDENT_AMBULATORY_CARE_PROVIDER_SITE_OTHER): Payer: Medicare Other | Admitting: Family Medicine

## 2022-09-25 ENCOUNTER — Encounter: Payer: Self-pay | Admitting: Family Medicine

## 2022-09-25 VITALS — BP 134/72 | HR 52 | Temp 98.2°F | Ht 71.0 in | Wt 231.4 lb

## 2022-09-25 DIAGNOSIS — K625 Hemorrhage of anus and rectum: Secondary | ICD-10-CM

## 2022-09-25 DIAGNOSIS — R200 Anesthesia of skin: Secondary | ICD-10-CM

## 2022-09-25 DIAGNOSIS — K219 Gastro-esophageal reflux disease without esophagitis: Secondary | ICD-10-CM | POA: Diagnosis not present

## 2022-09-25 DIAGNOSIS — I493 Ventricular premature depolarization: Secondary | ICD-10-CM

## 2022-09-25 DIAGNOSIS — G6289 Other specified polyneuropathies: Secondary | ICD-10-CM | POA: Diagnosis not present

## 2022-09-25 MED ORDER — OMEPRAZOLE 20 MG PO CPDR: 20.0000 mg | DELAYED_RELEASE_CAPSULE | Freq: Every day | ORAL | 3 refills | Status: DC

## 2022-09-25 NOTE — Progress Notes (Signed)
Subjective:  Patient ID: Thomas Harding, male    DOB: [birth year]  Age: 66 y.o. MRN: [id number]  CC:  Chief Complaint  Patient presents with   Hospitalization Follow-up    Pt was in the hospital for a cardiac ablation, pt states the procedure didn't go as well as they hoped for but the pt says he is doing well Pt wants to know if he can be on acid reflux meds because his calcium was so high   Referral    Pt wants a referral to neurologist due to neuropathy in both feet     HPI Thomas Harding presents for   Hospital follow-up  History of symptomatic PVCs. Admitted November 29 through 30, Under care of electrophysiology, Dr. Lovena Le for PVCs, electrophysiology study and RF catheter ablation of PVCs on 11/29.  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  Monitored on telemetry overnight, did have recurrence of PVCs although burden decreased.  Groin without complication and stable for discharge on November 30.  2 to 3-week follow-up planned for Zio patch to requantify PVC's.  Consideration of flecainide without CAD on cath but may need updated echo. Cardiology follow-up 12/4.  Symptoms improved, follow-up planned with electrophysiology, as needed with cardiology.  Feels a lot better. Zio patch this Friday.  No chest pains, no groin pain.  Has not detected PVC on his home monitoring.   Foot numbness History of lumbar radiculopathy, seen by Ortho in October.  Spinal stenosis with lumbar radiculopathy discussed at his October physical.  Status post lumbar laminectomy, decompression microdiscectomy in 2021 but ongoing dysesthesias for years.  Discussed some foot drop symptoms as well at his October visit, reason for Ortho follow-up.  Had been experiencing some falls, trips or stumbles in the preceding few months.  Some subjective weakness and foot drop on exam and difficulty with dorsiflexion on left foot. Saw ortho - Dr. Tonita Cong and had nerve conduction studies at ortho in October.  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 Told by Dr. Tonita Cong to see neuro next. No surgical approach recommended at this time. Not thought to be back issue.  Only occasional discomfort - usually numbness. No brace recommended. No recent trip/fall. Still has been on ladders. Trying to avoid. Did not tolerate gabapentin -even low dose - '300mg'$ .  Tsh, cbc nl in October.   GERD: Daily symptoms. Takes otc rolaids 3-4 times per day. 10-15 per day.  Pizza, fried food, coffee as triggers. Feels water brash, burning. No relief with pepcid in past.  No weight loss, night sweats. No vomiting. No abd pain. No difficulty swallowing. No melena. Single episode of hematochezia few weeks ago, no recurrence.  Diverticula, single polyp on 2109 colonoscopy. Repeat 5 yrs.    History Patient Active Problem List   Diagnosis Date Noted   PVC's (premature ventricular contractions) 09/12/2022   Atypical chest pain 04/26/2022   Apnea spell 04/26/2022   Fatigue 04/26/2022   Morbid obesity ([place]) 04/26/2022   Obesity (BMI 30.0-34.9) 04/26/2022   Elevated coronary artery calcium score 04/25/2022   Abnormal stress test 04/25/2022   Claudication ([place]) 01/31/2022   Paroxysmal atrial tachycardia 08/02/2021   Precordial pain 06/07/2021   Bradycardia 06/22/2020   PVC (premature ventricular contraction) 06/22/2020   Spinal stenosis at L4-L5 level 05/18/2020   Exertional dyspnea 11/22/2016   Past Medical History:  Diagnosis Date   Hyperlipidemia    Leg pain    OSA (obstructive sleep apnea)    PVC (premature ventricular contraction)  Squamous cell carcinoma of skin 01/16/2017   in situ- right crown    Past Surgical History:  Procedure Laterality Date   LEFT HEART CATH AND CORONARY ANGIOGRAPHY N/A 05/14/2022   Procedure: LEFT HEART CATH AND CORONARY ANGIOGRAPHY;  Surgeon: Nigel Mormon, MD;  Location: [facility];  Service: Cardiovascular;  Laterality: N/A;   LUMBAR LAMINECTOMY/DECOMPRESSION MICRODISCECTOMY  Left 05/18/2020   Procedure: Microlumbar decompression Lumbar Four- Lumbar Five, Lumbar Five- Sacral One left;  Surgeon: Susa Day, MD;  Location: [facility];  Service: Orthopedics;  Laterality: Left;  Microlumbar decompression Lumbar Five- Sacral One left   PVC ABLATION N/A 09/11/2022   Procedure: PVC ABLATION;  Surgeon: Evans Lance, MD;  Location: [facility];  Service: Cardiovascular;  Laterality: N/A;   ROTATOR CUFF REPAIR Right    thumb surgery Left    TONSILLECTOMY     VASECTOMY     Allergies  Allergen Reactions   Bee Venom Anaphylaxis   Penicillins Hives and Swelling   Prior to Admission medications   Medication Sig Start Date End Date Taking? Authorizing Provider  acetaminophen (TYLENOL) 500 MG tablet Take 1,000 mg by mouth daily as needed for moderate pain.   Yes [provider]  Alpha-Lipoic Acid 600 MG CAPS Take 1,200 mg by mouth daily.   Yes [provider]  amLODipine (NORVASC) 5 MG tablet Take 1 tablet (5 mg total) by mouth daily. 08/19/22 08/14/23 Yes Patwardhan, Manish J, MD  metoprolol succinate (TOPROL-XL) 50 MG 24 hr tablet Take 1 tablet (50 mg total) by mouth daily. Take with or immediately following a meal. 06/13/22 06/08/23 Yes Patwardhan, Manish J, MD  OVER THE COUNTER MEDICATION Take 2 capsules by mouth daily. H.A. Joint formula   Yes [provider]  rosuvastatin (CRESTOR) 20 MG tablet TAKE 1 TABLET BY MOUTH DAILY 08/14/22  Yes Patwardhan, Reynold Bowen, MD   Social History   Socioeconomic History   Marital status: Married    Spouse name: Not on file   Number of children: 2   Years of education: Not on file   Highest education level: Not on file  Occupational History   Not on file  Tobacco Use   Smoking status: Never   Smokeless tobacco: Never  Vaping Use   Vaping Use: Never used  Substance and Sexual Activity   Alcohol use: Yes    Comment: occ   Drug use: No   Sexual activity: Yes  Other Topics Concern   Not on file   Social History Narrative   Not on file   Social Determinants of Health   Financial Resource Strain: Not on file  Food Insecurity: Not on file  Transportation Needs: Not on file  Physical Activity: Not on file  Stress: Not on file  Social Connections: Not on file  Intimate Partner Violence: Not on file    Review of Systems Per HPI.   Objective:   Vitals:   09/25/22 1013  BP: 134/72  Pulse: (!) 52  Temp: 98.2 F (36.8 C)  SpO2: 98%  Weight: 231 lb 6.4 oz (105 kg)  Height: '5\' 11"'$  (1.803 m)     Physical Exam Vitals reviewed.  Constitutional:      Appearance: He is well-developed.  HENT:     Head: Normocephalic and atraumatic.  Neck:     Vascular: No carotid bruit or JVD.  Cardiovascular:     Rate and Rhythm: Normal rate and regular rhythm.     Heart sounds:  Normal heart sounds. No murmur heard. Pulmonary:     Effort: Pulmonary effort is normal.     Breath sounds: Normal breath sounds. No rales.  Abdominal:     General: Abdomen is flat. Bowel sounds are normal.     Tenderness: There is no abdominal tenderness.  Musculoskeletal:     Right lower leg: No edema.     Left lower leg: No edema.  Skin:    General: Skin is warm and dry.  Neurological:     Mental Status: He is alert and oriented to person, place, and time.  Psychiatric:        Mood and Affect: Mood normal.        Assessment & Plan:  Thomas Harding is a 66 y.o. male . PVC's (premature ventricular contractions)  -Improved status procedure/ablation as above.  Continue follow-up with gastroenterology.  RTC precautions.  Numbness of foot - Plan: Ambulatory referral to Neurology Other polyneuropathy - Plan: Ambulatory referral to Neurology, B12  -Check B12, refer to neurology to decide on further testing.  Initially thought to be possible lumbar source, but neuro eval recommended.  Lower dose option of gabapentin or Lyrica if more symptomatic, he will let me know.  Gastroesophageal reflux  disease, unspecified whether esophagitis present - Plan: omeprazole (PRILOSEC) 20 MG capsule  -No red flags except single episode of bright red blood per rectum, has not returned, RTC/ER precautions given.  Start omeprazole, trigger avoidance, RTC precautions.  BRBPR (bright red blood per rectum) Single episode few weeks ago, history of diverticula, could be diverticular bleed prior, resolved, asymptomatic at present.  ER precautions given.  Meds ordered this encounter  Medications   omeprazole (PRILOSEC) 20 MG capsule    Sig: Take 1 capsule (20 mg total) by mouth daily.    Dispense:  30 capsule    Refill:  3   Patient Instructions  Glad to hear pvc's are better.  I will refer you to neuro. Gabapentin '100mg'$  is an option or Lyrica if more pain/burning.  Avoid ladders if possible for now.   [facility] for B12.  Walk in 8:30-4:30 during weekdays, no appointment needed [place].  [place], [place] [address]  If blood in stool returns be seen.  See triggers to avoid for GERD below.  Can start omeprazole once per day for now.   Food Choices for Gastroesophageal Reflux Disease, Adult When you have gastroesophageal reflux disease (GERD), the foods you eat and your eating habits are very important. Choosing the right foods can help ease the discomfort of GERD. Consider working with a dietitian to help you make healthy food choices. What are tips for following this plan? Reading food labels Look for foods that are low in saturated fat. Foods that have less than 5% of daily value (DV) of fat and 0 g of trans fats may help with your symptoms. Cooking Cook foods using methods other than frying. This may include baking, steaming, grilling, or broiling. These are all methods that do not need a lot of fat for cooking. To add flavor, try to use herbs that are low in spice and acidity. Meal planning  Choose healthy foods that are low in fat, such as fruits, vegetables, whole grains, low-fat  dairy products, lean meats, fish, and poultry. Eat frequent, small meals instead of three large meals each day. Eat your meals slowly, in a relaxed setting. Avoid bending over or lying down until 2-3 hours after eating. Limit high-fat foods such  as fatty meats or fried foods. Limit your intake of fatty foods, such as oils, butter, and shortening. Avoid the following as told by your health care provider: Foods that cause symptoms. These may be different for different people. Keep a food diary to keep track of foods that cause symptoms. Alcohol. Drinking large amounts of liquid with meals. Eating meals during the 2-3 hours before bed. Lifestyle Maintain a healthy weight. Ask your health care provider what weight is healthy for you. If you need to lose weight, work with your health care provider to do so safely. Exercise for at least 30 minutes on 5 or more days each week, or as told by your health care provider. Avoid wearing clothes that fit tightly around your waist and chest. Do not use any products that contain nicotine or tobacco. These products include cigarettes, chewing tobacco, and vaping devices, such as e-cigarettes. If you need help quitting, ask your health care provider. Sleep with the head of your bed raised. Use a wedge under the mattress or blocks under the bed frame to raise the head of the bed. Chew sugar-free gum after mealtimes. What foods should I eat?  Eat a healthy, well-balanced diet of fruits, vegetables, whole grains, low-fat dairy products, lean meats, fish, and poultry. Each person is different. Foods that may trigger symptoms in one person may not trigger any symptoms in another person. Work with your health care provider to identify foods that are safe for you. The items listed above may not be a complete list of recommended foods and beverages. Contact a dietitian for more information. What foods should I avoid? Limiting some of these foods may help manage the  symptoms of GERD. Everyone is different. Consult a dietitian or your health care provider to help you identify the exact foods to avoid, if any. Fruits Any fruits prepared with added fat. Any fruits that cause symptoms. For some people this may include citrus fruits, such as oranges, grapefruit, pineapple, and lemons. Vegetables Deep-fried vegetables. [place] fries. Any vegetables prepared with added fat. Any vegetables that cause symptoms. For some people, this may include tomatoes and tomato products, chili peppers, onions and garlic, and horseradish. Grains Pastries or quick breads with added fat. Meats and other proteins High-fat meats, such as fatty beef or pork, hot dogs, ribs, ham, sausage, salami, and bacon. Fried meat or protein, including fried fish and fried chicken. Nuts and nut butters, in large amounts. Dairy Whole milk and chocolate milk. Sour cream. Cream. Ice cream. Cream cheese. Milkshakes. Fats and oils Butter. Margarine. Shortening. Ghee. Beverages Coffee and tea, with or without caffeine. Carbonated beverages. Sodas. Energy drinks. Fruit juice made with acidic fruits, such as orange or grapefruit. Tomato juice. Alcoholic drinks. Sweets and desserts Chocolate and cocoa. Donuts. Seasonings and condiments Pepper. Peppermint and spearmint. Added salt. Any condiments, herbs, or seasonings that cause symptoms. For some people, this may include curry, hot sauce, or vinegar-based salad dressings. The items listed above may not be a complete list of foods and beverages to avoid. Contact a dietitian for more information. Questions to ask your health care provider Diet and lifestyle changes are usually the first steps that are taken to manage symptoms of GERD. If diet and lifestyle changes do not improve your symptoms, talk with your health care provider about taking medicines. Where to find more information International Foundation for Gastrointestinal Disorders:  aboutgerd.org Summary When you have gastroesophageal reflux disease (GERD), food and lifestyle choices may be very helpful in easing  the discomfort of GERD. Eat frequent, small meals instead of three large meals each day. Eat your meals slowly, in a relaxed setting. Avoid bending over or lying down until 2-3 hours after eating. Limit high-fat foods such as fatty meats or fried foods. This information is not intended to replace advice given to you by your health care provider. Make sure you discuss any questions you have with your health care provider. Document Revised: 04/10/2020 Document Reviewed: 04/10/2020 Elsevier Patient Education  [place],   Merri Ray, MD [place] 09/25/22 10:33 AM

## 2022-09-25 NOTE — Patient Instructions (Addendum)
Glad to hear pvc's are better.  I will refer you to neuro. Gabapentin '100mg'$  is an option or Lyrica if more pain/burning.  Avoid ladders if possible for now.   [facility] for B12.  Walk in 8:30-4:30 during weekdays, no appointment needed [place].  [place], [place] [address]  If blood in stool returns be seen.  See triggers to avoid for GERD below.  Can start omeprazole once per day for now.   Food Choices for Gastroesophageal Reflux Disease, Adult When you have gastroesophageal reflux disease (GERD), the foods you eat and your eating habits are very important. Choosing the right foods can help ease the discomfort of GERD. Consider working with a dietitian to help you make healthy food choices. What are tips for following this plan? Reading food labels Look for foods that are low in saturated fat. Foods that have less than 5% of daily value (DV) of fat and 0 g of trans fats may help with your symptoms. Cooking Cook foods using methods other than frying. This may include baking, steaming, grilling, or broiling. These are all methods that do not need a lot of fat for cooking. To add flavor, try to use herbs that are low in spice and acidity. Meal planning  Choose healthy foods that are low in fat, such as fruits, vegetables, whole grains, low-fat dairy products, lean meats, fish, and poultry. Eat frequent, small meals instead of three large meals each day. Eat your meals slowly, in a relaxed setting. Avoid bending over or lying down until 2-3 hours after eating. Limit high-fat foods such as fatty meats or fried foods. Limit your intake of fatty foods, such as oils, butter, and shortening. Avoid the following as told by your health care provider: Foods that cause symptoms. These may be different for different people. Keep a food diary to keep track of foods that cause symptoms. Alcohol. Drinking large amounts of liquid with meals. Eating meals during the 2-3 hours before  bed. Lifestyle Maintain a healthy weight. Ask your health care provider what weight is healthy for you. If you need to lose weight, work with your health care provider to do so safely. Exercise for at least 30 minutes on 5 or more days each week, or as told by your health care provider. Avoid wearing clothes that fit tightly around your waist and chest. Do not use any products that contain nicotine or tobacco. These products include cigarettes, chewing tobacco, and vaping devices, such as e-cigarettes. If you need help quitting, ask your health care provider. Sleep with the head of your bed raised. Use a wedge under the mattress or blocks under the bed frame to raise the head of the bed. Chew sugar-free gum after mealtimes. What foods should I eat?  Eat a healthy, well-balanced diet of fruits, vegetables, whole grains, low-fat dairy products, lean meats, fish, and poultry. Each person is different. Foods that may trigger symptoms in one person may not trigger any symptoms in another person. Work with your health care provider to identify foods that are safe for you. The items listed above may not be a complete list of recommended foods and beverages. Contact a dietitian for more information. What foods should I avoid? Limiting some of these foods may help manage the symptoms of GERD. Everyone is different. Consult a dietitian or your health care provider to help you identify the exact foods to avoid, if any. Fruits Any fruits prepared with added fat. Any fruits that cause symptoms.  For some people this may include citrus fruits, such as oranges, grapefruit, pineapple, and lemons. Vegetables Deep-fried vegetables. [place] fries. Any vegetables prepared with added fat. Any vegetables that cause symptoms. For some people, this may include tomatoes and tomato products, chili peppers, onions and garlic, and horseradish. Grains Pastries or quick breads with added fat. Meats and other proteins High-fat  meats, such as fatty beef or pork, hot dogs, ribs, ham, sausage, salami, and bacon. Fried meat or protein, including fried fish and fried chicken. Nuts and nut butters, in large amounts. Dairy Whole milk and chocolate milk. Sour cream. Cream. Ice cream. Cream cheese. Milkshakes. Fats and oils Butter. Margarine. Shortening. Ghee. Beverages Coffee and tea, with or without caffeine. Carbonated beverages. Sodas. Energy drinks. Fruit juice made with acidic fruits, such as orange or grapefruit. Tomato juice. Alcoholic drinks. Sweets and desserts Chocolate and cocoa. Donuts. Seasonings and condiments Pepper. Peppermint and spearmint. Added salt. Any condiments, herbs, or seasonings that cause symptoms. For some people, this may include curry, hot sauce, or vinegar-based salad dressings. The items listed above may not be a complete list of foods and beverages to avoid. Contact a dietitian for more information. Questions to ask your health care provider Diet and lifestyle changes are usually the first steps that are taken to manage symptoms of GERD. If diet and lifestyle changes do not improve your symptoms, talk with your health care provider about taking medicines. Where to find more information International Foundation for Gastrointestinal Disorders: aboutgerd.org Summary When you have gastroesophageal reflux disease (GERD), food and lifestyle choices may be very helpful in easing the discomfort of GERD. Eat frequent, small meals instead of three large meals each day. Eat your meals slowly, in a relaxed setting. Avoid bending over or lying down until 2-3 hours after eating. Limit high-fat foods such as fatty meats or fried foods. This information is not intended to replace advice given to you by your health care provider. Make sure you discuss any questions you have with your health care provider. Document Revised: 04/10/2020 Document Reviewed: 04/10/2020 Elsevier Patient Education  [place].

## 2022-09-26 NOTE — Progress Notes (Signed)
Cardiology Office Note Date:  09/27/2022  Patient ID:  Thomas Harding, Thomas Harding, MRN [id number] PCP:  [name], MD  Cardiologist:  None Electrophysiologist: Cristopher Peru, MD    Chief Complaint: PVC  History of Present Illness: Thomas Harding is a 66 y.o. male seen today for Cristopher Peru, MD   He wore a zio monitor that showed 24%. S/p PVC ablation 08/2022 by Dr. Lovena Le, monitored on tele overnight. Had some PVCs post-operatively though burden decreased. Plan at that time was to have him wear a monitor to determine new PVC burden, if elevated, consider flecainide after obtaining updated echo.  Saw Dr. Virgina Jock 09/16/2022, doing well with improvement in symptoms  Today, he reports feeling much better since ablation. Noticed that he has more energy, especially at the end of the day. Prior to ablation, had "nothing left in the tank" at the end of the day and just ate dinner and went to bed. He has a Psychologist, sport and exercise and has checked rhythm multiple times without PVC.  He denies CP, pressure, palpitations, DOE, syncope or presyncope.   Owns Architect business. Has many immediate family members in medical field.    Past Medical History:  Diagnosis Date   Hyperlipidemia    Leg pain    OSA (obstructive sleep apnea)    PVC (premature ventricular contraction)    Squamous cell carcinoma of skin 01/16/2017   in situ- right crown     Past Surgical History:  Procedure Laterality Date   LEFT HEART CATH AND CORONARY ANGIOGRAPHY N/A 05/14/2022   Procedure: LEFT HEART CATH AND CORONARY ANGIOGRAPHY;  Surgeon: Nigel Mormon, MD;  Location: [facility];  Service: Cardiovascular;  Laterality: N/A;   LUMBAR LAMINECTOMY/DECOMPRESSION MICRODISCECTOMY Left 05/18/2020   Procedure: Microlumbar decompression Lumbar Four- Lumbar Five, Lumbar Five- Sacral One left;  Surgeon: Susa Day, MD;  Location: [facility];  Service: Orthopedics;  Laterality: Left;  Microlumbar decompression  Lumbar Five- Sacral One left   PVC ABLATION N/A 09/11/2022   Procedure: PVC ABLATION;  Surgeon: Evans Lance, MD;  Location: [facility];  Service: Cardiovascular;  Laterality: N/A;   ROTATOR CUFF REPAIR Right    thumb surgery Left    TONSILLECTOMY     VASECTOMY      Current Outpatient Medications  Medication Sig Dispense Refill   acetaminophen (TYLENOL) 500 MG tablet Take 1,000 mg by mouth daily as needed for moderate pain.     Alpha-Lipoic Acid 600 MG CAPS Take 1,200 mg by mouth daily.     amLODipine (NORVASC) 5 MG tablet Take 1 tablet (5 mg total) by mouth daily. 90 tablet 3   metoprolol succinate (TOPROL-XL) 50 MG 24 hr tablet Take 1 tablet (50 mg total) by mouth daily. Take with or immediately following a meal. 90 tablet 3   omeprazole (PRILOSEC) 20 MG capsule Take 1 capsule (20 mg total) by mouth daily. 30 capsule 3   OVER THE COUNTER MEDICATION Take 2 capsules by mouth daily. H.A. Joint formula     rosuvastatin (CRESTOR) 20 MG tablet TAKE 1 TABLET BY MOUTH DAILY 30 tablet 3   No current facility-administered medications for this visit.    Allergies:   Bee venom and Penicillins   Social History:  The patient  reports that he has never smoked. He has never used smokeless tobacco. He reports current alcohol use. He reports that he does not use drugs.   Family History:  The patient's family history includes Hyperlipidemia  in his father; Hypertension in his mother.  ROS:  Please see the history of present illness. All other systems are reviewed and otherwise negative.   PHYSICAL EXAM:  Vitals:   09/27/22 0956  BP: 124/66  Pulse: 60  SpO2: 98%  Weight: 229 lb (103.9 kg)  Height: '5\' 11"'$  (1.803 m)    GEN- The patient is well appearing, alert and oriented x 3 today.   HEENT: normocephalic, atraumatic; sclera clear, conjunctiva pink; hearing intact; oropharynx clear; neck supple, no JVP Lungs- Clear to ausculation bilaterally, normal work of breathing.  No wheezes,  rales, rhonchi Heart- Regular rate and rhythm, no murmurs, rubs or gallops, PMI not laterally displaced GI- soft, non-tender, non-distended, bowel sounds present, no hepatosplenomegaly Extremities- No peripheral edema. no clubbing or cyanosis; DP/PT/radial pulses 2+ bilaterally MS- no significant deformity or atrophy Skin- warm and dry, no rash or lesion Psych- euthymic mood, full affect Neuro- strength and sensation are intact   EKG is ordered. Personal review of EKG from today shows: NSR, rate 60bpm  Recent Labs: 02/25/2022: ALT 20 08/08/2022: TSH 1.42 09/02/2022: BUN 17; Creatinine, Ser 1.32; Hemoglobin 14.8; Platelets 216; Potassium 4.6; Sodium 139  02/25/2022: Direct LDL 95.0; VLDL 64.8 05/09/2022: Chol/HDL Ratio 2.7; Cholesterol, Total 106; HDL 40; LDL Chol Calc (NIH) 46; Triglycerides 111   CrCl cannot be calculated (Patient's most recent lab result is older than the maximum 21 days allowed.).   Wt Readings from Last 3 Encounters:  09/27/22 229 lb (103.9 kg)  09/25/22 231 lb 6.4 oz (105 kg)  09/16/22 226 lb (102.5 kg)     Additional studies reviewed include: Previous EP notes.   PVC Echocardiogram (06/26/2021) @ [facility] Left ventricle cavity is normal in size and wall thickness. Normal global  wall motion. Normal LV systolic function with EF 60%. Normal diastolic  filling pattern.  No significant valvular abnormality.  No evidence of pulmonary hypertension.  No significant change compared to previous study in 2017.    PVC Ablation (09/11/2022) Results. 1. Baseline ECG. The baseline ECG demonstrates sinus rhythm with trigeminal and quadrigeminal PVCs. These were monomorphic. 2. Baseline intervals. The sinus node cycle length was 911 ms. The QRS duration 88 ms. The HV interval was 46 ms. The AH interval was 82 ms. Following ablation, there is no appreciable difference in the intervals but the PVCs had resolved. 3. Rapid ventricular pacing. Following  catheter ablation rapid ventricular pacing was carried out from the right ventricle demonstrating VA block at 340 ms. During rapid ventricular pacing following ablation there were no inducible PVCs or VT. 4. Rapid atrial pacing. Rapid dual pacing was carried out from the right atrium demonstrating AV block at 400 ms. There is no inducible SVT with rapid atrial pacing. 5. Arrhythmias observed. PVCs in a bigeminal, trigeminal, and quadrigeminal fashion.  6. 3D mapping - 3D mapping was carried out in the RV and LV OT. At the site of early activation the local ventricular electrogram was 70-26 MS presystolic.  7. RF energy application - 7 RFenergy applications were delivered in the left coronary cusp.   Mobile cardiac telemetry 7 days 05/15/2022 - 05/22/2022: Dominant rhythm: Sinus. HR 46-124 bpm. Avg HR 65 bpm, in sinus rhythm. 4 episodes of atrial tachycardia, fastest at 150 bpm for 4 beats, longest for 6 beats at 93 bpm. <1% isolated SVE, couplets. 1 episodes of VT, fastest at 164 bpm for 4 beats. 24% isolated VE, <1% couplet/triplets. No atrial fibrillation/atrial flutter/VT/high grade AV block, sinus pause >3sec  noted. 3 patient triggered events, correlated with SVE, VE.    LEFT AND RIGHT CATH AND CORONARY ANGIOGRAPHY (05/14/2022) Right dominant circulation No angiographic evidence of coronary artery disease Slightly sluggish flow likely due to frequent PVCs/ventricular bigeminy   Exercise Tetrofosmin stress test 04/08/2022: Exercise nuclear stress test was performed using Bruce protocol. Patient reached 8 METS, and 98% of age predicted maximum heart rate. Exercise capacity was fair. No chest pain reported. Heart rate and hemodynamic response were normal. Stress EKG revealed no ischemic changes. Stress EKG showed sinus tachycardia, frequent PVC's, 1.5 mm horizontal ST depression in leads V4-V6 with normalization within 1 min to recovery, near resolution of frequent PVC's within 2 min into  recovery. Stress EKG is equivocal for ischemia, but significant for exercise induced PVC's. SPECT images show small sized, mild intensity, reversible perfusion defect in basal inferoseptal myocardium. Mild global decrease in left ventricle wall motion and thickening. Stress LVEF 41%. LVEF estimation could be affected by frequent PVC's. Intermediate risk study. Will consider EP referral  CT CARDIAC CORONARY ARTERY CALCIUM SCORE 03/2022 FINDINGS: CORONARY CALCIUM SCORES: Left Main: 0 LAD: 54 LCx: 2.21 RCA: 13.1 Total Agatston Score: 69.3 MESA database percentile: 49     ASSESSMENT AND PLAN:  #) PVC s/p ablation Much improved  symptoms 3 day zio to assess burden If PVC burden elevated, consider updated echo.  Consider flecainide (already on BB)    Current medicines are reviewed at length with the patient today.   The patient does not have concerns regarding his medicines.  The following changes were made today:  none  Labs/ tests ordered today include:  Orders Placed This Encounter  Procedures   LONG TERM MONITOR (3-14 DAYS)   EKG 12-Lead     Disposition: Follow up with Dr. Lovena Le in 1-2 months   Signed, Mamie Levers, NP  09/27/22 10:55 AM   [address] [place] [address] [phone] (office)  [phone] (fax)

## 2022-09-27 ENCOUNTER — Ambulatory Visit: Payer: Medicare Other | Attending: Cardiology

## 2022-09-27 ENCOUNTER — Ambulatory Visit: Payer: Medicare Other | Attending: Physician Assistant | Admitting: Cardiology

## 2022-09-27 ENCOUNTER — Encounter: Payer: Self-pay | Admitting: Physician Assistant

## 2022-09-27 VITALS — BP 124/66 | HR 60 | Ht 71.0 in | Wt 229.0 lb

## 2022-09-27 DIAGNOSIS — I493 Ventricular premature depolarization: Secondary | ICD-10-CM | POA: Diagnosis not present

## 2022-09-27 DIAGNOSIS — R931 Abnormal findings on diagnostic imaging of heart and coronary circulation: Secondary | ICD-10-CM

## 2022-09-27 NOTE — Patient Instructions (Addendum)
Medication Instructions:    Your physician recommends that you continue on your current medications as directed. Please refer to the Current Medication list given to you today.  *If you need a refill on your cardiac medications before your next appointment, please call your pharmacy*   Lab Work:  [place]   If you have labs (blood work) drawn today and your tests are completely normal, you will receive your results only by: [place] (if you have MyChart) OR A paper copy in the mail If you have any lab test that is abnormal or we need to change your treatment, we will call you to review the results.   Testing/Procedures:  Your physician has recommended that you wear an event monitor. Event monitors are medical devices that record the heart's electrical activity. Doctors most often [place] these monitors to diagnose arrhythmias. Arrhythmias are problems with the speed or rhythm of the heartbeat. The monitor is a small, portable device. You can wear one while you do your normal daily activities. This is usually used to diagnose what is causing palpitations/syncope (passing out).    Follow-Up: At [facility], you and your health needs are our priority.  As part of our continuing mission to provide you with exceptional heart care, we have created designated Provider Care Teams.  These Care Teams include your primary Cardiologist (physician) and Advanced Practice Providers (APPs -  Physician Assistants and Nurse Practitioners) who all work together to provide you with the care you need, when you need it.  We recommend signing up for the patient portal called "MyChart".  Sign up information is provided on this After Visit Summary.  MyChart is used to connect with patients for Virtual Visits (Telemedicine).  Patients are able to view lab/test results, encounter notes, upcoming appointments, etc.  Non-urgent messages can be sent to your provider as well.   To learn more about  what you can do with MyChart, go to [email or web address].    Your next appointment:   2-3 month(s)  The format for your next appointment:   In Person  Provider:   You may see Cristopher Peru, MD or one of the following Advanced Practice Providers on your designated Care Team:   Tommye Standard, [place] Legrand Como "Jonni Sanger" Chalmers Cater, [place]  Other Instructions  Bryn Gulling- Long Term Monitor Instructions  Your physician has requested you wear a ZIO patch monitor for  3 days.  This is a single patch monitor. [employer] supplies one patch monitor per enrollment. Additional stickers are not available. Please do not apply patch if you will be having a Nuclear Stress Test,  Echocardiogram, Cardiac CT, MRI, or Chest Xray during the period you would be wearing the  monitor. The patch cannot be worn during these tests. You cannot remove and re-apply the  ZIO XT patch monitor.  Your ZIO patch monitor will be mailed 3 day USPS to your address on file. It may take 3-5 days  to receive your monitor after you have been enrolled.  Once you have received your monitor, please review the enclosed instructions. Your monitor  has already been registered assigning a specific monitor serial # to you.  Billing and Patient Assistance Program Information  We have supplied [employer] with any of your insurance information on file for billing purposes. [employer] offers a sliding scale Patient Assistance Program for patients that do not have  insurance, or whose insurance does not completely cover the cost of the ZIO monitor.  You must apply  for the Patient Assistance Program to qualify for this discounted rate.  To apply, please call [employer] at [phone], select option 4, select option 2, ask to apply for  Patient Assistance Program. Theodore Demark will ask your household income, and how many people  are in your household. They will quote your out-of-pocket cost based on that information.  [employer] will also be able to set up a 25-month  interest-free payment plan if needed.  Applying the monitor   Shave hair from upper left chest.  Hold abrader disc by orange tab. Rub abrader in 40 strokes over the upper left chest as  indicated in your monitor instructions.  Clean area with 4 enclosed alcohol pads. Let dry.  Apply patch as indicated in monitor instructions. Patch will be placed under collarbone on left  side of chest with arrow pointing upward.  Rub patch adhesive wings for 2 minutes. Remove white label marked "1". Remove the white  label marked "2". Rub patch adhesive wings for 2 additional minutes.  While looking in a mirror, press and release button in center of patch. A small green light will  flash 3-4 times. This will be your only indicator that the monitor has been turned on.  Do not shower for the first 24 hours. You may shower after the first 24 hours.  Press the button if you feel a symptom. You will hear a small click. Record Date, Time and  Symptom in the Patient Logbook.  When you are ready to remove the patch, follow instructions on the last 2 pages of Patient  Logbook. Stick patch monitor onto the last page of Patient Logbook.  Place Patient Logbook in the blue and white box. Use locking tab on box and tape box closed  securely. The blue and white box has prepaid postage on it. Please place it in the mailbox as  soon as possible. Your physician should have your test results approximately 7 days after the  monitor [facility]  Call [employer] [facility] [phone] you have questions regarding  your ZIO XT patch monitor. Call them immediately if you see an orange light blinking on your  monitor.  If your monitor falls off in less than 4 days, contact our Monitor department at [phone]  If your monitor becomes loose or falls off after 4 days call [employer] at [phone]  suggestions on securing your monitor  Important Information About Sugar

## 2022-09-27 NOTE — Progress Notes (Unsigned)
Enrolled patient for a 3 day Zio XT monitor to be mailed to patients home  Dr Lovena Le to read

## 2022-09-28 IMAGING — CT CT CARDIAC CORONARY ARTERY CALCIUM SCORE
3 series · 14 of 20 positions shown, 16 images · non-contrast
Comparison: None Available.

CLINICAL DATA: CAD screening, intermediate CAD risk, treadmill
candidate. Hyperlipidemia. Family history.

EXAM:
CT CARDIAC CORONARY ARTERY CALCIUM SCORE
TECHNIQUE: Non-contrast imaging through the heart was performed using
prospective ECG gating. Image post processing was performed on an
independent workstation, allowing for quantitative analysis of the
heart and coronary arteries. Note that this exam targets the heart
and the chest was not imaged in its entirety.

[Series 2: calcium scoring 2.00 qr36 bestdiast 71% hrt calciu · axial · 0.45mm/px · z∈[+1588,+1660]mm · 4 of 60 slices shown]
[im 12/60  vessel]
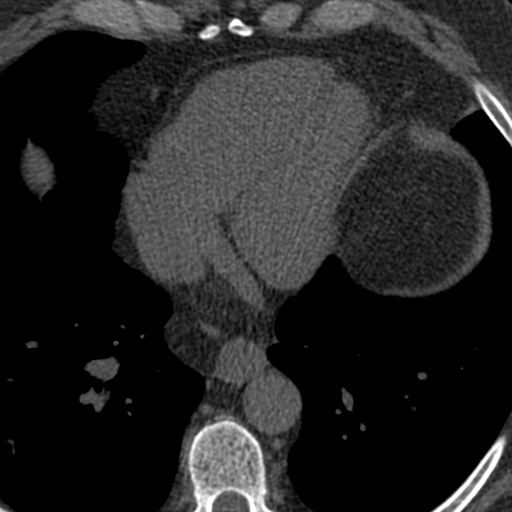
[im 24/60  vessel]
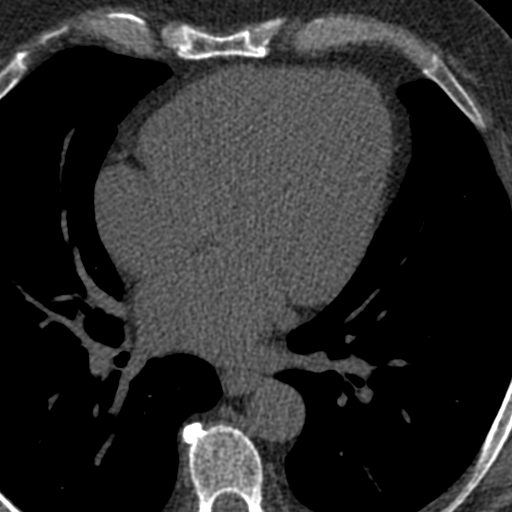
[im 36/60  vessel]
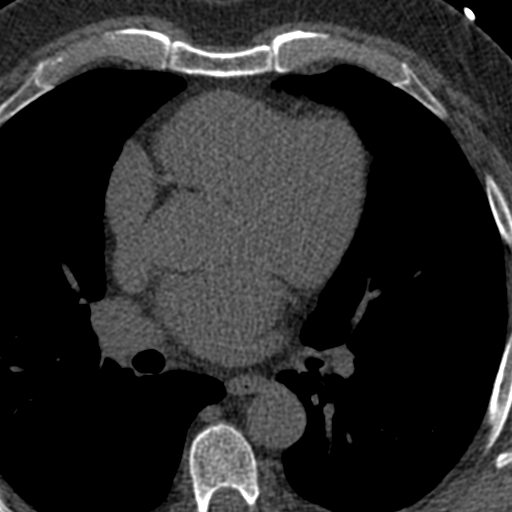
[im 48/60  vessel]
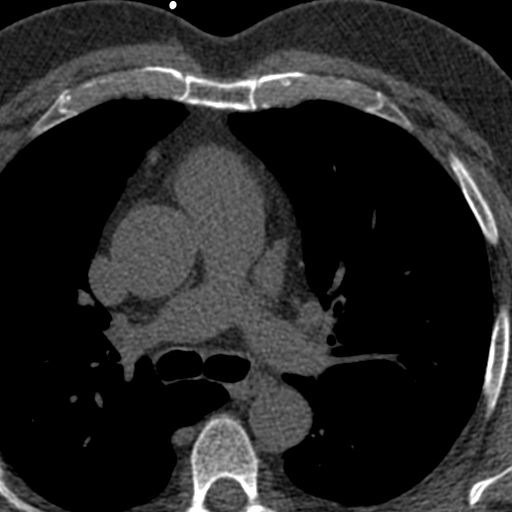

[Series 3: calcium scoring 2.00 br40 bestdiast 71% axial · axial · 0.67mm/px · z∈[+1584,+1664]mm · 5 of 60 slices shown, 7 images]
[im 10/60  vessel]
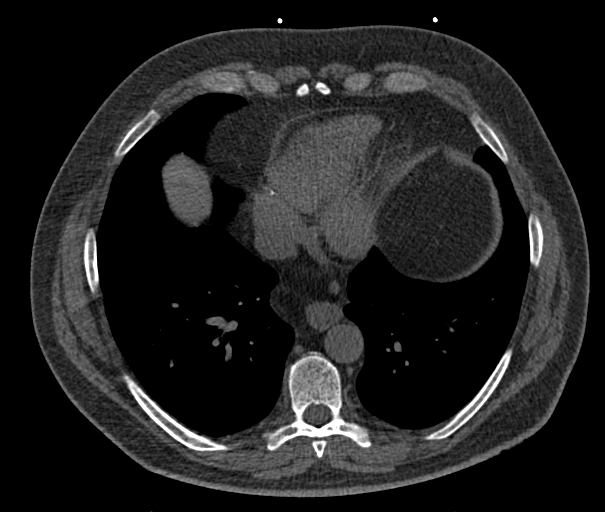
[im 10/60  lung]
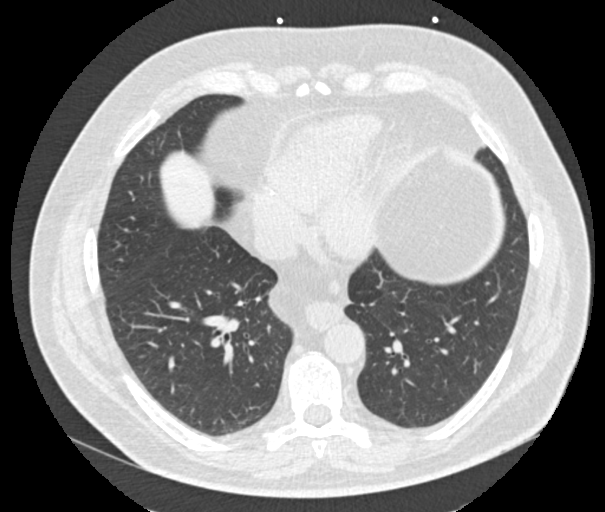
[im 20/60  vessel]
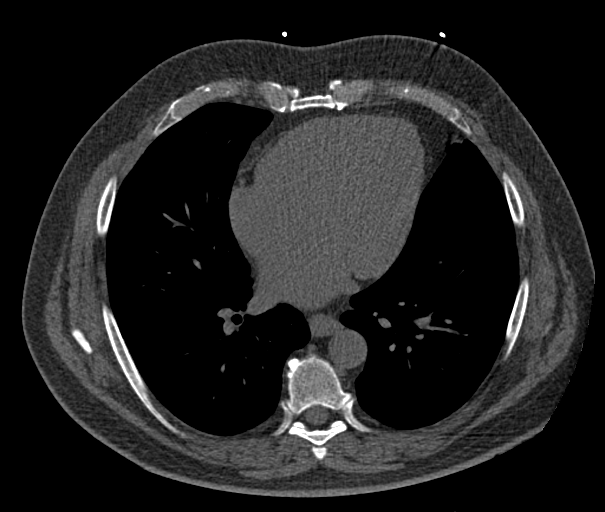
[im 30/60  vessel]
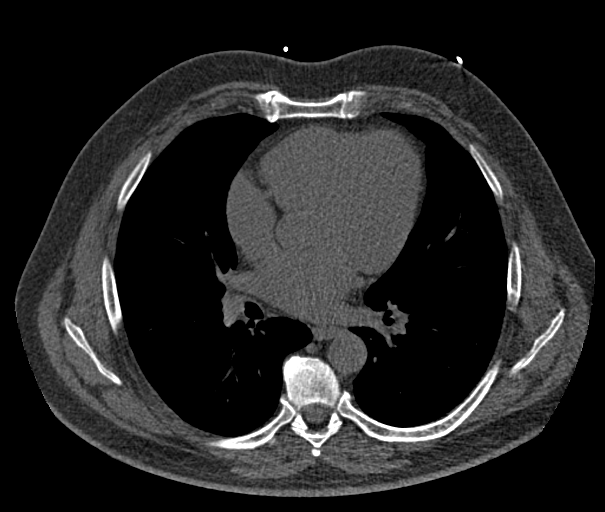
[im 40/60  vessel]
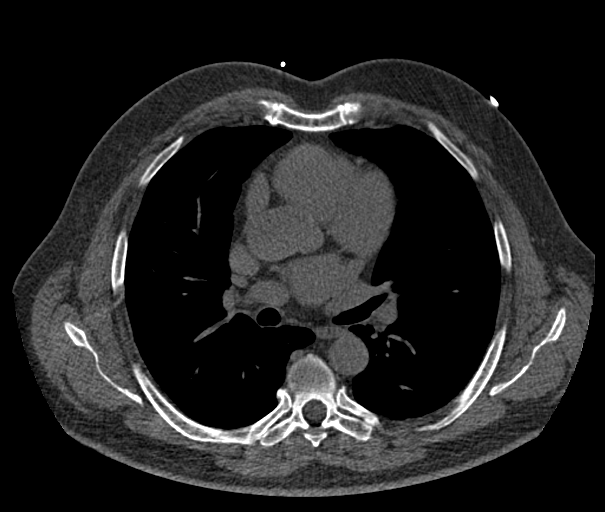
[im 50/60  vessel]
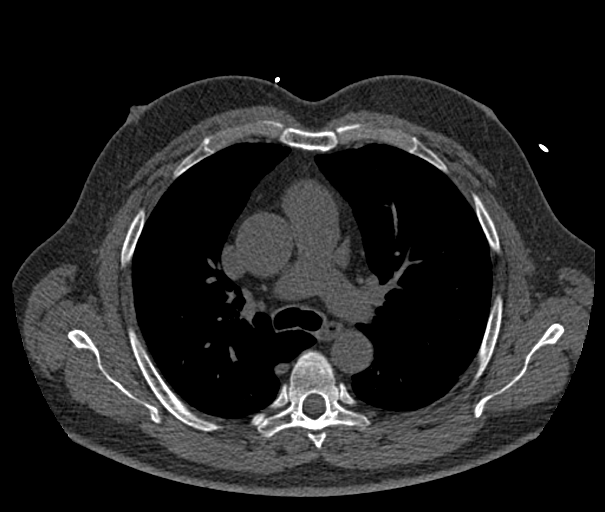
[im 50/60  lung]
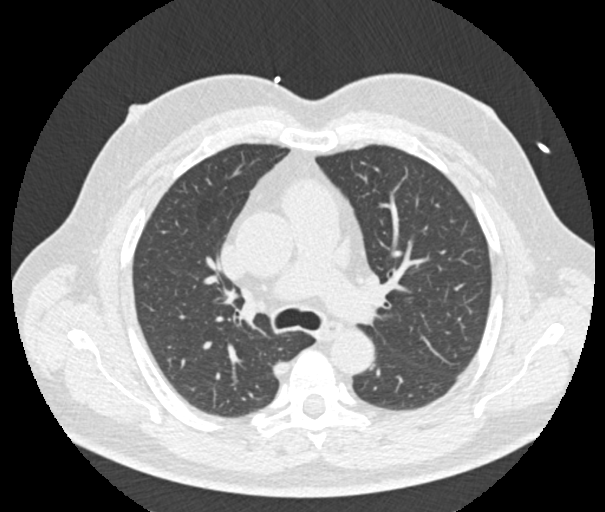

[Series 9: calcium scoring 2.00 br60 bestdiast 71% lungs · axial · 0.67mm/px · z∈[+1584,+1664]mm · 5 of 60 slices shown]
[im 10/60  vessel]
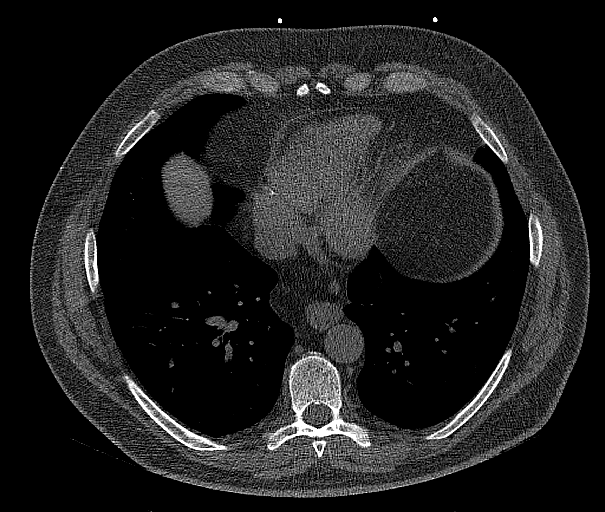
[im 20/60  vessel]
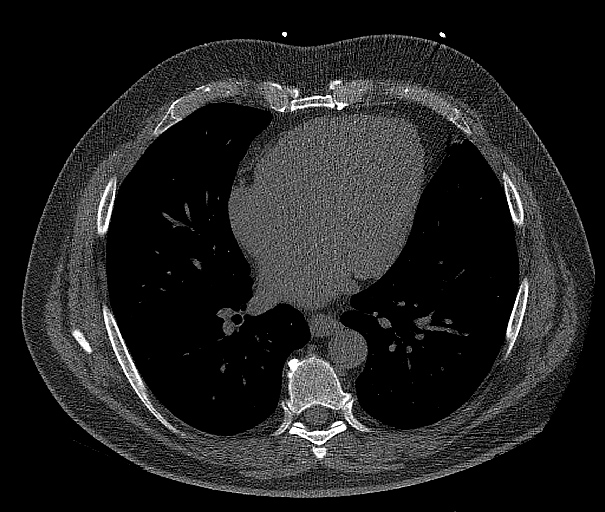
[im 30/60  vessel]
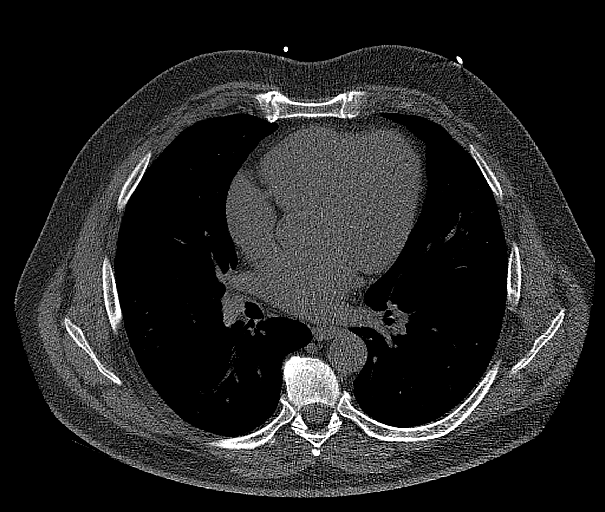
[im 40/60  vessel]
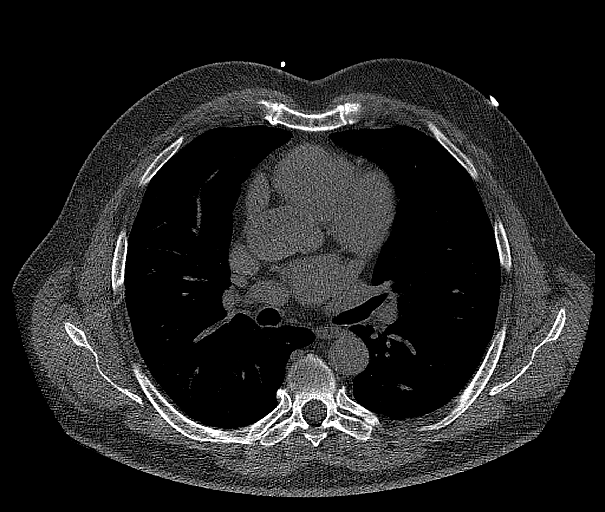
[im 50/60  vessel]
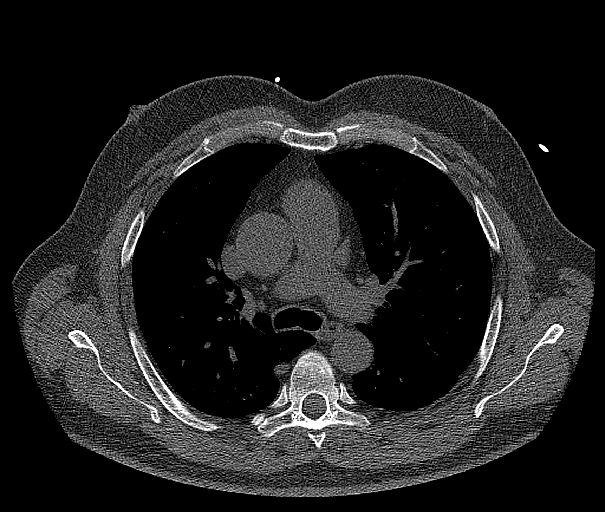

[14 of 20 positions shown; findings below may reference images not displayed]

FINDINGS: CORONARY CALCIUM SCORES:

Left Main: 0

LAD: 54

LCx:

RCA:

Total Agatston Score:

[HOSPITAL] percentile: 49

AORTA MEASUREMENTS:

Ascending Aorta: 35 mm

Descending Aorta: 26 mm

OTHER FINDINGS:

Heart is normal size. Aorta normal caliber. Scattered aortic
calcifications. No adenopathy. No confluent opacities or effusions.
No acute findings in the upper abdomen. Chest wall soft tissues are
unremarkable. No acute bony abnormality.
IMPRESSION: Total Agatston score:

[HOSPITAL] percentile: 49

Scattered aortic atherosclerosis.

No acute extra cardiac abnormality.

## 2022-10-01 ENCOUNTER — Ambulatory Visit: Payer: Medicare Other | Admitting: Family

## 2022-10-01 ENCOUNTER — Encounter: Payer: Self-pay | Admitting: Family

## 2022-10-01 ENCOUNTER — Encounter: Payer: Self-pay | Admitting: Family Medicine

## 2022-10-01 VITALS — BP 124/70 | HR 56 | Temp 98.1°F | Ht 71.0 in | Wt 231.6 lb

## 2022-10-01 DIAGNOSIS — Z860100 Personal history of colon polyps, unspecified: Secondary | ICD-10-CM | POA: Insufficient documentation

## 2022-10-01 DIAGNOSIS — K219 Gastro-esophageal reflux disease without esophagitis: Secondary | ICD-10-CM | POA: Insufficient documentation

## 2022-10-01 DIAGNOSIS — J069 Acute upper respiratory infection, unspecified: Secondary | ICD-10-CM

## 2022-10-01 DIAGNOSIS — R051 Acute cough: Secondary | ICD-10-CM

## 2022-10-01 MED ORDER — PREDNISONE 20 MG PO TABS: 60.0000 mg | ORAL_TABLET | Freq: Every day | ORAL | 0 refills | Status: DC

## 2022-10-01 MED ORDER — PROMETHAZINE-DM 6.25-15 MG/5ML PO SYRP: 5.0000 mL | ORAL_SOLUTION | Freq: Four times a day (QID) | ORAL | 0 refills | Status: DC | PRN

## 2022-10-01 NOTE — Progress Notes (Signed)
Acute Office Visit  Subjective:     Patient ID: Thomas Harding, male    DOB: 18-Jun-1956, 66 y.o.   MRN: [id number]  Chief Complaint  Patient presents with  . Cough    Pt states he has cough and congestion and drainage pt states it has been going on for 1 week pt has not taken a covid test     HPI Patient is in today with c/o cough, congestion and sore throat x 1 week. Has had sinus drainage. No fever or chills. Has had an influenza vaccine. No known sick contacts.   Review of Systems  Constitutional:  Negative for chills and fever.  HENT:  Positive for congestion and sore throat. Negative for sinus pain.   Respiratory:  Positive for cough. Negative for shortness of breath and wheezing.   Gastrointestinal: Negative.   Genitourinary: Negative.   Musculoskeletal: Negative.   Neurological: Negative.   Endo/Heme/Allergies: Negative.   Psychiatric/Behavioral: Negative.    All other systems reviewed and are negative.  Past Medical History:  Diagnosis Date  . Hyperlipidemia   . Leg pain   . OSA (obstructive sleep apnea)   . PVC (premature ventricular contraction)   . Squamous cell carcinoma of skin 01/16/2017   in situ- right crown     Social History   Socioeconomic History  . Marital status: Married    Spouse name: Not on file  . Number of children: 2  . Years of education: Not on file  . Highest education level: Not on file  Occupational History  . Not on file  Tobacco Use  . Smoking status: Never  . Smokeless tobacco: Never  Vaping Use  . Vaping Use: Never used  Substance and Sexual Activity  . Alcohol use: Yes    Comment: occ  . Drug use: No  . Sexual activity: Yes  Other Topics Concern  . Not on file  Social History Narrative  . Not on file   Social Determinants of Health   Financial Resource Strain: Not on file  Food Insecurity: Not on file  Transportation Needs: Not on file  Physical Activity: Not on file  Stress: Not on file  Social  Connections: Not on file  Intimate Partner Violence: Not on file    Past Surgical History:  Procedure Laterality Date  . LEFT HEART CATH AND CORONARY ANGIOGRAPHY N/A 05/14/2022   Procedure: LEFT HEART CATH AND CORONARY ANGIOGRAPHY;  Surgeon: Nigel Mormon, MD;  Location: [facility];  Service: Cardiovascular;  Laterality: N/A;  . LUMBAR LAMINECTOMY/DECOMPRESSION MICRODISCECTOMY Left 05/18/2020   Procedure: Microlumbar decompression Lumbar Four- Lumbar Five, Lumbar Five- Sacral One left;  Surgeon: Susa Day, MD;  Location: [facility];  Service: Orthopedics;  Laterality: Left;  Microlumbar decompression Lumbar Five- Sacral One left  . PVC ABLATION N/A 09/11/2022   Procedure: PVC ABLATION;  Surgeon: Evans Lance, MD;  Location: [facility];  Service: Cardiovascular;  Laterality: N/A;  . ROTATOR CUFF REPAIR Right   . thumb surgery Left   . TONSILLECTOMY    . VASECTOMY      Family History  Problem Relation Age of Onset  . Hypertension Mother   . Hyperlipidemia Father   . Sleep apnea Neg Hx     Allergies  Allergen Reactions  . Bee Venom Anaphylaxis  . Penicillins Hives and Swelling    Current Outpatient Medications on File Prior to Visit  Medication Sig Dispense Refill  . acetaminophen (TYLENOL) 500 MG  tablet Take 1,000 mg by mouth daily as needed for moderate pain.    . Alpha-Lipoic Acid 600 MG CAPS Take 1,200 mg by mouth daily.    Marland Kitchen amLODipine (NORVASC) 5 MG tablet Take 1 tablet (5 mg total) by mouth daily. 90 tablet 3  . metoprolol succinate (TOPROL-XL) 50 MG 24 hr tablet Take 1 tablet (50 mg total) by mouth daily. Take with or immediately following a meal. 90 tablet 3  . omeprazole (PRILOSEC) 20 MG capsule Take 1 capsule (20 mg total) by mouth daily. 30 capsule 3  . OVER THE COUNTER MEDICATION Take 2 capsules by mouth daily. H.A. Joint formula    . rosuvastatin (CRESTOR) 20 MG tablet TAKE 1 TABLET BY MOUTH DAILY 30 tablet 3   No current facility-administered  medications on file prior to visit.    BP 124/70   Pulse (!) 56   Temp 98.1 F (36.7 C)   Ht '5\' 11"'$  (1.803 m)   Wt 231 lb 9.6 oz (105.1 kg)   SpO2 97%   BMI 32.30 kg/m chart     Objective:    BP 124/70   Pulse (!) 56   Temp 98.1 F (36.7 C)   Ht '5\' 11"'$  (1.803 m)   Wt 231 lb 9.6 oz (105.1 kg)   SpO2 97%   BMI 32.30 kg/m    Physical Exam Vitals and nursing note reviewed.  Constitutional:      Appearance: Normal appearance. He is normal weight.  HENT:     Right Ear: Tympanic membrane and ear canal normal.     Left Ear: Tympanic membrane and ear canal normal.  Cardiovascular:     Rate and Rhythm: Normal rate and regular rhythm.  Pulmonary:     Effort: Pulmonary effort is normal.     Breath sounds: Normal breath sounds.  Musculoskeletal:        General: Normal range of motion.     Cervical back: Normal range of motion and neck supple.  Skin:    General: Skin is warm and dry.  Neurological:     General: No focal deficit present.     Mental Status: He is alert and oriented to person, place, and time.  Psychiatric:        Mood and Affect: Mood normal.        Behavior: Behavior normal.   No results found for any visits on 10/01/22.      Assessment & Plan:   Problem List Items Addressed This Visit   None Visit Diagnoses     Viral upper respiratory illness    -  Primary   Acute cough           Meds ordered this encounter  Medications  . predniSONE (DELTASONE) 20 MG tablet    Sig: Take 3 tablets (60 mg total) by mouth daily with breakfast.    Dispense:  15 tablet    Refill:  0  . promethazine-dextromethorphan (PROMETHAZINE-DM) 6.25-15 MG/5ML syrup    Sig: Take 5 mLs by mouth 4 (four) times daily as needed for cough.    Dispense:  118 mL    Refill:  0   Call the office if symptoms worsen or persist. Recheck as scheduled and sooner as needed.  No follow-ups on file.  Kennyth Arnold, FNP

## 2022-10-08 ENCOUNTER — Encounter: Payer: Self-pay | Admitting: Family Medicine

## 2022-10-10 ENCOUNTER — Encounter: Payer: Self-pay | Admitting: Family Medicine

## 2022-10-10 ENCOUNTER — Ambulatory Visit: Payer: Medicare Other | Admitting: Family Medicine

## 2022-10-10 VITALS — BP 128/68 | HR 63 | Temp 98.8°F | Ht 71.0 in | Wt 229.0 lb

## 2022-10-10 DIAGNOSIS — R052 Subacute cough: Secondary | ICD-10-CM | POA: Diagnosis not present

## 2022-10-10 MED ORDER — AZITHROMYCIN 250 MG PO TABS: ORAL_TABLET | ORAL | 0 refills | Status: AC

## 2022-10-10 MED ORDER — BENZONATATE 100 MG PO CAPS: 100.0000 mg | ORAL_CAPSULE | Freq: Three times a day (TID) | ORAL | 0 refills | Status: DC | PRN

## 2022-10-10 NOTE — Patient Instructions (Signed)
Tessalon Perles up to 3 times per day as needed for cough.  Glad to hear things are starting to turn the corner.  I did send in azithromycin continue to improve and tomorrow.  Follow-up if cough does not continue to improve or if any worsening.   Take care.

## 2022-10-10 NOTE — Progress Notes (Signed)
Subjective:  Patient ID: Thomas Harding, male    DOB: Dec 11, 1955  Age: 66 y.o. MRN: [id number]  CC:  Chief Complaint  Patient presents with   Cough    Pt states he has had a cough for over 2 weeks, no other symptoms with it, pt is having sinus drainage, pt states its worse during the day     HPI Thomas Harding presents for   Cough Past 2 weeks.  Some drainage from sinuses as above - discolored.  Started on omeprazole once per day at his December 13 visit for heartburn. Has been working very well - no breakthrough sx's. Seen by Dutch Quint on 12/19. Viral illness, treated with prednisone, and cough syrup. Minimal change.  Initial discolored nasal d/c and some phlegm - less  in past 24 hrs, and staring tot improve in past 24 hrs.  No fever, dyspnea. Some coughing fits, no emesis.  Tx: cough syrup 3 times per day, nyquil, dayquil. - min change.   History Patient Active Problem List   Diagnosis Date Noted   Gastroesophageal reflux disease 10/01/2022   Personal history of colonic polyps 10/01/2022   PVC's (premature ventricular contractions) 09/12/2022   Atypical chest pain 04/26/2022   Apnea spell 04/26/2022   Fatigue 04/26/2022   Morbid obesity ([place]) 04/26/2022   Obesity 04/26/2022   Elevated coronary artery calcium score 04/25/2022   Abnormal stress test 04/25/2022   Claudication ([place]) 01/31/2022   Paroxysmal atrial tachycardia 08/02/2021   Precordial pain 06/07/2021   Bradycardia 06/22/2020   PVC (premature ventricular contraction) 06/22/2020   Spinal stenosis of lumbar region 04/20/2020   Dyspnea 11/22/2016   Past Medical History:  Diagnosis Date   Hyperlipidemia    Leg pain    OSA (obstructive sleep apnea)    PVC (premature ventricular contraction)    Squamous cell carcinoma of skin 01/16/2017   in situ- right crown    Past Surgical History:  Procedure Laterality Date   LEFT Harding CATH AND CORONARY ANGIOGRAPHY N/A 05/14/2022   Procedure: LEFT Harding CATH AND  CORONARY ANGIOGRAPHY;  Surgeon: Nigel Mormon, MD;  Location: [facility];  Service: Cardiovascular;  Laterality: N/A;   LUMBAR LAMINECTOMY/DECOMPRESSION MICRODISCECTOMY Left 05/18/2020   Procedure: Microlumbar decompression Lumbar Four- Lumbar Five, Lumbar Five- Sacral One left;  Surgeon: Susa Day, MD;  Location: [facility];  Service: Orthopedics;  Laterality: Left;  Microlumbar decompression Lumbar Five- Sacral One left   PVC ABLATION N/A 09/11/2022   Procedure: PVC ABLATION;  Surgeon: Evans Lance, MD;  Location: [facility];  Service: Cardiovascular;  Laterality: N/A;   ROTATOR CUFF REPAIR Right    thumb surgery Left    TONSILLECTOMY     VASECTOMY     Allergies  Allergen Reactions   Bee Venom Anaphylaxis   Penicillins Hives and Swelling   Prior to Admission medications   Medication Sig Start Date End Date Taking? Authorizing Provider  acetaminophen (TYLENOL) 500 MG tablet Take 1,000 mg by mouth daily as needed for moderate pain.   Yes [provider]  Alpha-Lipoic Acid 600 MG CAPS Take 1,200 mg by mouth daily.   Yes [provider]  amLODipine (NORVASC) 5 MG tablet Take 1 tablet (5 mg total) by mouth daily. 08/19/22 08/14/23 Yes Patwardhan, Reynold Bowen, MD  DM-Doxylamine-Acetaminophen (NYQUIL COLD & FLU PO) Take 1 % by mouth 1 day or 1 dose.   Yes [provider]  metoprolol succinate (TOPROL-XL) 50 MG 24 hr  tablet Take 1 tablet (50 mg total) by mouth daily. Take with or immediately following a meal. 06/13/22 06/08/23 Yes Patwardhan, Manish J, MD  omeprazole (PRILOSEC) 20 MG capsule Take 1 capsule (20 mg total) by mouth daily. 09/25/22  Yes [name], MD  OVER THE COUNTER MEDICATION Take 2 capsules by mouth daily. H.A. Joint formula   Yes [provider]  promethazine-dextromethorphan (PROMETHAZINE-DM) 6.25-15 MG/5ML syrup Take 5 mLs by mouth 4 (four) times daily as needed for cough. 10/01/22  Yes Dutch Quint B, FNP   rosuvastatin (CRESTOR) 20 MG tablet TAKE 1 TABLET BY MOUTH DAILY 08/14/22  Yes Patwardhan, Manish J, MD  predniSONE (DELTASONE) 20 MG tablet Take 3 tablets (60 mg total) by mouth daily with breakfast. Patient not taking: Reported on 10/10/2022 10/01/22   Kennyth Arnold, FNP   Social History   Socioeconomic History   Marital status: Married    Spouse name: Not on file   Number of children: 2   Years of education: Not on file   Highest education level: Not on file  Occupational History   Not on file  Tobacco Use   Smoking status: Never   Smokeless tobacco: Never  Vaping Use   Vaping Use: Never used  Substance and Sexual Activity   Alcohol use: Yes    Comment: occ   Drug use: No   Sexual activity: Yes  Other Topics Concern   Not on file  Social History Narrative   Not on file   Social Determinants of Health   Financial Resource Strain: Not on file  Food Insecurity: Not on file  Transportation Needs: Not on file  Physical Activity: Not on file  Stress: Not on file  Social Connections: Not on file  Intimate Partner Violence: Not on file    Review of Systems   Objective:   Vitals:   10/10/22 0906  BP: 128/68  Pulse: 63  Temp: 98.8 F (37.1 C)  SpO2: 97%  Weight: 229 lb (103.9 kg)  Height: '5\' 11"'$  (1.803 m)     Physical Exam Vitals reviewed.  Constitutional:      Appearance: He is well-developed.  HENT:     Head: Normocephalic and atraumatic.     Right Ear: Tympanic membrane, ear canal and external ear normal.     Left Ear: Tympanic membrane, ear canal and external ear normal.     Nose: No rhinorrhea.     Comments: Sinuses nontender    Mouth/Throat:     Pharynx: No oropharyngeal exudate or posterior oropharyngeal erythema.  Eyes:     Conjunctiva/sclera: Conjunctivae normal.     Pupils: Pupils are equal, round, and reactive to light.  Cardiovascular:     Rate and Rhythm: Normal rate and regular rhythm.     Harding sounds: Normal Harding sounds. No murmur  heard. Pulmonary:     Effort: Pulmonary effort is normal.     Breath sounds: Normal breath sounds. No wheezing, rhonchi or rales.  Abdominal:     Palpations: Abdomen is soft.     Tenderness: There is no abdominal tenderness.  Musculoskeletal:     Cervical back: Neck supple.  Lymphadenopathy:     Cervical: No cervical adenopathy.  Skin:    General: Skin is warm and dry.     Findings: No rash.  Neurological:     Mental Status: He is alert and oriented to person, place, and time.  Psychiatric:        Behavior: Behavior normal.  Assessment & Plan:  Thomas Harding is a 66 y.o. male . Subacute cough - Plan: benzonatate (TESSALON) 100 MG capsule, azithromycin (ZITHROMAX) 250 MG tablet Initial likely viral illness with persistent cough, discolored phlegm as above.  Sinuses nontender, slight improvement since yesterday.  Tessalon Perles prescribed if needed for cough, azithromycin if not continuing to improve to cover for atypicals with RTC precautions given, potential side effects of meds discussed.   Meds ordered this encounter  Medications   benzonatate (TESSALON) 100 MG capsule    Sig: Take 1 capsule (100 mg total) by mouth 3 (three) times daily as needed for cough.    Dispense:  20 capsule    Refill:  0   azithromycin (ZITHROMAX) 250 MG tablet    Sig: Take 2 tablets on day 1, then 1 tablet daily on days 2 through 5    Dispense:  6 tablet    Refill:  0   Patient Instructions  Tessalon Perles up to 3 times per day as needed for cough.  Glad to hear things are starting to turn the corner.  I did send in azithromycin continue to improve and tomorrow.  Follow-up if cough does not continue to improve or if any worsening.   Take care.     Signed,   Merri Ray, MD [place], [facility] 10/10/22 9:58 AM

## 2022-10-15 ENCOUNTER — Ambulatory Visit (INDEPENDENT_AMBULATORY_CARE_PROVIDER_SITE_OTHER): Payer: Medicare Other | Admitting: Neurology

## 2022-10-15 DIAGNOSIS — E669 Obesity, unspecified: Secondary | ICD-10-CM

## 2022-10-15 DIAGNOSIS — G4720 Circadian rhythm sleep disorder, unspecified type: Secondary | ICD-10-CM

## 2022-10-15 DIAGNOSIS — R9431 Abnormal electrocardiogram [ECG] [EKG]: Secondary | ICD-10-CM

## 2022-10-15 DIAGNOSIS — G4733 Obstructive sleep apnea (adult) (pediatric): Secondary | ICD-10-CM | POA: Diagnosis not present

## 2022-10-15 DIAGNOSIS — G478 Other sleep disorders: Secondary | ICD-10-CM

## 2022-10-15 DIAGNOSIS — R519 Headache, unspecified: Secondary | ICD-10-CM

## 2022-10-15 DIAGNOSIS — R0789 Other chest pain: Secondary | ICD-10-CM

## 2022-10-15 DIAGNOSIS — I4719 Other supraventricular tachycardia: Secondary | ICD-10-CM

## 2022-10-17 DIAGNOSIS — I493 Ventricular premature depolarization: Secondary | ICD-10-CM | POA: Diagnosis not present

## 2022-10-18 ENCOUNTER — Ambulatory Visit: Payer: Medicare Other | Admitting: Internal Medicine

## 2022-10-21 NOTE — Addendum Note (Signed)
Addended by: Star Age on: 10/21/2022 06:53 PM   Modules accepted: Orders

## 2022-10-21 NOTE — Procedures (Signed)
Physician Interpretation:     [facility] PAP TITRATION INTERPRETATION REPORT   STUDY DATE: 10/15/2022      PATIENT NAME:  Thomas Harding         DATE OF BIRTH:  [birth year]  PATIENT ID:  [id number]    TYPE OF STUDY:  CPAP  READING PHYSICIAN: Star Age, MD, PhD SCORING TECHNICIAN: Richard Miu, RPSGT     Referred by: Dr. Virgina Jock  History: 67 year old right-handed gentleman with an underlying medical history of hyperlipidemia, PVCs, s/p ablation on 09/11/2022, bradycardia, history of paroxysmal atrial tachycardia, atypical chest pain and mild obesity, who presents for a full night PAP titration study. His baseline sleep study from 07/23/2022 showed moderate to severe obstructive sleep apnea, with a total AHI of 26.5/hour, REM AHI of 45.3/hour, supine AHI of 8.6/hour and O2 nadir of 82%. His Epworth sleepiness score is 7 out of 24, fatigue severity score is 26 out of 63. Height:?71?in?Weight:?213?lb?(BMI 29)?Neck Size:?17?in?  MEDICATIONS:?Tylenol, Norvasc, Lopressor, Crestor  DESCRIPTION: A sleep technologist was in attendance for the duration of the recording.  Data collection, scoring, video monitoring, and reporting were performed in compliance with the AASM Manual for the Scoring of Sleep and Associated Events; (Hypopnea is scored based on the criteria listed in Section VIII D. 1b in the AASM Manual V2.6 using a 4% oxygen desaturation rule or Hypopnea is scored based on the criteria listed in Section VIII D. 1a in the AASM Manual V2.6 using 3% oxygen desaturation and /or arousal rule).  A physician certified by the American Board of Sleep Medicine reviewed each epoch of the study.  SLEEP CONTINUITY AND SLEEP ARCHITECTURE:  Lights off was at 22:13: and lights on 05:00: (6.8 hours in bed). Total sleep time was 318.5 minutes (18.7% supine;  81.3% lateral;  0.0% prone, 21.7% REM sleep), with a decreased sleep efficiency at 78.4%. Sleep latency was increased  at 52.0 minutes.  Of the total sleep time, the percentage of stage N1 sleep was 7.4%, stage N2 sleep was 57.8%, which is mildly increased, stage N3 sleep was 13.2%, and REM sleep was 21.7%, which is normal, REM latency was normal at 83.5 min. Wake after sleep onset (WASO) time accounted for 36 minutes with minimal to mild sleep fragmentation noted.   AROUSAL: There were 51 arousals in total, for an arousal index of 9.6 arousals/hour.  Of these, 7 were identified as respiratory-related arousals (1.3 /h), 0 were PLM-related arousals (0.0 /h), and 58 were non-specific arousals (10.9 /h)  RESPIRATORY MONITORING:  Based on CMS criteria (using a 4% oxygen desaturation rule for scoring hypopneas), there were 2 apneas (0 obstructive; 2 central; 0 mixed), and 28 hypopneas.  Apnea index was 0.4. Hypopnea index was 5.3. The apnea-hypopnea index was 5.7 overall (13.1 supine, 8.7 non-supine; 8.7 REM, 0.0 supine REM). There were 0 respiratory effort-related arousals (RERAs).  The RERA index was 0.0 events/h. Total respiratory disturbance index (RDI) was 5.7 events/h. RDI results showed: supine RDI  13.1 /h; non-supine RDI 3.9 /h; REM RDI 8.7 /h, supine REM RDI 0.0 /h.   Based on AASM criteria (using a 3% oxygen desaturation and /or arousal rule for scoring hypopneas), there were 2 apneas (0 obstructive; 2 central; 0 mixed), and 28 hypopneas. Apnea index was 0.4. Hypopnea index was 5.3. The apnea-hypopnea index was 5.7 overall (13.1 supine, 8.7 non-supine; 8.7 REM, 0.0 supine REM). There were 0 respiratory effort-related arousals (RERAs).  The RERA index was 0.0 events/h. Total respiratory disturbance index (RDI)  was 5.7 events/h. RDI results showed: supine RDI  13.1 /h; non-supine RDI 3.9 /h; REM RDI 8.7 /h, supine REM RDI 0.0 /h.  Respiratory events were associated with oxyhemoglobin desaturations (nadir during sleep 80%) from a mean of 96%). There were 0 occurrences of Cheyne Stokes breathing.  OXIMETRY: Total sleep  time spent at, or below 88% was 2.0 minutes, or 0.6% of total sleep time. Snoring was classified as .  BODY POSITION: Duration of total sleep and percent of total sleep in their respective position is as follows: supine 59 minutes (18.7%), non-supine 259.0 minutes (81.3%); right 114 minutes (35.8%), left 145 minutes (45.5%), and prone 00 minutes (0.0%). Total supine REM sleep time was 00 minutes (0.0% of total REM sleep).  LIMB MOVEMENTS: There were 0 periodic limb movements of sleep (0.0/h), of which 0 (0.0/h) were associated with an arousal.  TITRATION DETAILS (SEE ALSO TABLE AT THE END OF THE REPORT):  The patient was shown several different interfaces and was subsequently fitted with an XS fullface mask from [place], [place]. CPAP was started at a pressure of 5 cm with EPR of 2 and gradually advanced to a final pressure of 15 cm, at which time the patient achieved a total sleep time of 44 minutes, residual AHI 1.36/h, with nonsupine non-REM sleep achieved, O2 nadir 89%.   EEG: Review of the EEG showed no abnormal electrical discharges and symmetrical bihemispheric findings.   EKG: The EKG revealed frequent PVCs, including bigeminy. The average heart rate during sleep was 57 bpm.   AUDIO/VIDEO REVIEW: The audio and video review did not show any abnormal or unusual behaviors, movements, phonations or vocalizations. The patient took one. Snoring was significantly improved with PAP therapy.  POST-STUDY QUESTIONNAIRE: Post study, the patient indicated, that sleep was the same as usual, he commented that CPAP therapy was not as cumbersome as he had expected. He indicated that he was at times claustrophobic. He complimented the acquisition sleep technologist and reported that she was very informative, very willing to answer questions and very professional in explaining the process, the equipment and what to expect.   IMPRESSION:  1.?Obstructive sleep apnea (OSA) 2.?Dysfunctions associated with  sleep stages or arousal from sleep 3.?Non-specific abnormal electrocardiogram (EKG)  RECOMMENDATIONS:  1.?This study demonstrates significant improvement of the patient's obstructive sleep apnea with CPAP therapy. I recommend home CPAP therapy at a pressure of 15 cm with EPR of 2, via Evora fullface mask from [employer], size XS, with heated humidity. The patient will be advised to be fully compliant with PAP therapy to improve sleep related symptoms and decrease long term cardiovascular risks. The patient should be reminded, that it may take up to 3 months to get fully used to using PAP with all planned sleep. The earlier full compliance is achieved, the better long term compliance tends to be. Please note that untreated obstructive sleep apnea may carry additional perioperative morbidity. Patients with significant obstructive sleep apnea should receive perioperative PAP therapy and the surgeons and particularly the anesthesiologist should be informed of the diagnosis and the severity of the sleep disordered breathing. 2.?This study shows minimal to mild sleep fragmentation and no significant abnormality in his sleep stage percentages; these are nonspecific findings and per se do not signify an intrinsic sleep disorder or a cause for the patient's sleep-related symptoms. Causes include (but are not limited to) the first night effect of the sleep study, circadian rhythm disturbances, medication effect or an underlying mood disorder or medical problem.  3.?The  patient should be cautioned not to drive, work at heights, or operate dangerous or heavy equipment when tired or sleepy. Review and reiteration of good sleep hygiene measures should be pursued with any patient. 4.?The study showed frequent PVCs, including bigeminy on single lead EKG; clinical correlation is recommended; the patient is well-known to cardiology and recently status post cardiac ablation.??? 5.?The patient will be seen in follow-up in  the sleep clinic at [facility] for discussion of the test results, symptom and treatment compliance review, further management strategies, etc. The patient and his referring provider will be notified of the test results. ? I certify that I have reviewed the entire raw data recording prior to the issuance of this report in accordance with the Standards of Accreditation of the American Academy of Sleep Medicine (AASM).  Star Age, MD, PhD Medical Director, [facility]) [place], ABPN (Neurology and Sleep)              Technical Report:   [facility] CPAP Summary    General Information  Name: Thomas Harding, Thomas Harding BMI: 29.71 Physician: Star Age, MD  ID: [id number] Height: 71.0 in Technician: Richard Miu, RPSGT  Sex: Male Weight: 213.0 lb Record: [id number]  Age: 25 [1956/01/06] Date: 10/15/2022     Medical & Medication History    67 year old right-handed gentleman with an underlying medical history of hyperlipidemia, PVCs, bradycardia, history of paroxysmal atrial tachycardia, atypical chest pain and mild obesity, who reports some daytime tiredness, nonrestorative sleep and witnessed apneas. The apnea-hypopnea index was 26.5/hour overall (8.6 supine, 45 non-supine; 45.3 REM, 0.0 supine REM). Tylenol, Norvasc, Lopressor, Crestor   Sleep Disorder      Comments   The patient came into the lab for a CPAP titration. The patient had a prior PSG on 07/23/22 with our sleep lab. The patient was fitted with a F&P Evora (FFM) size XS. The mask was a good fit to face, and the patient tolerate the mask well. CPAP was started at [facility] with EPR of 2. CPAP was increased to 15cmH2O with EPR of 2. Due to patient having respiratory events. The patient had one restroom break. EKG showed frequent PVC's. There was a period during the night that the patient was having Bigeminy PVC's Per pt he had a recent ablation. No snoring. All  sleep stages witnessed. Respiratory events scored with a 4% desat. Slept mostly lateral and supine.     CPAP start time: 10:14:02 PM CPAP end time: 05:00:29 AM   Time Total Supine Side Prone Upright  Recording (TRT) 6h 46.2m2h 14.51mh 32.57m51m 0.57m 457m0.57m  34mep (TST) 5h 18.72m 0h457m.72m 4h 38m57m 0h 04m 0h 0.32m  Late61m N1 N2 N3 REM Onset Per. Slp. Eff.  Actual 0h 0.57m 0h 1.72m38m 23.57m39m 23.72m 83m52.57m 032m2.57m 7833m%   Stg34mr Wake N1 N2 N3 REM  Total 88.0 23.5 184.0 42.0 69.0  Supine 75.0 11.0 33.5 15.0 0.0  Side 13.0 12.5 150.5 27.0 69.0  Prone 0.0 0.0 0.0 0.0 0.0  Upright 0.0 0.0 0.0 0.0 0.0   Stg % Wake N1 N2 N3 REM  Total 21.6 7.4 57.8 13.2 21.7  Supine 18.5 3.5 10.5 4.7 0.0  Side 3.2 3.9 47.3 8.5 21.7  Prone 0.0 0.0 0.0 0.0 0.0  Upright 0.0 0.0 0.0 0.0 0.0     Apnea Summary Sub Supine Side Prone Upright  Total 2 Total 2 0 2 0 0  REM 2 0 2 0 0    NREM 0 0 0 0 0  Obs 0 REM 0 0 0 0 0    NREM 0 0 0 0 0  Mix 0 REM 0 0 0 0 0    NREM 0 0 0 0 0  Cen 2 REM 2 0 2 0 0    NREM 0 0 0 0 0   Rera Summary Sub Supine Side Prone Upright  Total 0 Total 0 0 0 0 0    REM 0 0 0 0 0    NREM 0 0 0 0 0   Hypopnea Summary Sub Supine Side Prone Upright  Total 28 Total '28 13 15 '$ 0 0    REM 8 0 8 0 0    NREM '20 13 7 '$ 0 0   4% Hypopnea Summary Sub Supine Side Prone Upright  Total (4%) 28 Total '28 13 15 '$ 0 0    REM 8 0 8 0 0    NREM '20 13 7 '$ 0 0     AHI Total Obs Mix Cen  5.65 Apnea 0.38 0.00 0.00 0.38   Hypopnea 5.27 -- -- --  5.65 Hypopnea (4%) 5.27 -- -- --    Total Supine Side Prone Upright  Position AHI 5.65 13.11 3.94 0.00 0.00  REM AHI 8.70   NREM AHI 4.81   Position RDI 5.65 13.11 3.94 0.00 0.00  REM RDI 8.70   NREM RDI 4.81    4% Hypopnea Total Supine Side Prone Upright  Position AHI (4%) 5.65 13.11 3.94 0.00 0.00  REM AHI (4%) 8.70   NREM AHI (4%) 4.81   Position RDI (4%) 5.65 13.11 3.94 0.00 0.00  REM RDI (4%) 8.70   NREM RDI (4%) 4.81    Desaturation  Information  <100% <90% <80% <70% <60% <50% <40%  Supine 33 0 0 0 0 0 0  Side 19 1 0 0 0 0 0  Prone 0 0 0 0 0 0 0  Upright 0 0 0 0 0 0 0  Total 52 1 0 0 0 0 0  Desaturation threshold setting: 3% Minimum desaturation setting: 10 seconds SaO2 nadir: 80% The longest event was a 37 sec obstructive Hypopnea with a minimum SaO2 of 93%. The lowest SaO2 was 90% associated with a 14 sec obstructive Hypopnea. EKG Rates EKG Avg Max Min  Awake 66 126 36  Asleep 57 95 35  EKG Events: Bradycardia and Tachycardia Awakening/Arousal Information # of Awakenings 18  Wake after sleep onset 36.54m Wake after persistent sleep 36.082m Arousal Assoc. Arousals Index  Apneas 0 0.0  Hypopneas 7 1.3  Leg Movements 0 0.0  Snore 0.0 0.0  PTT Arousals 0 0.0  Spontaneous 58 10.9  Total 65 12.2  Myoclonus Information PLMS LMs Index  Total LMs during PLMS 0 0.0  LMs w/ Microarousals 0 0.0   LM LMs Index  w/ Microarousal 0 0.0  w/ Awakening 0 0.0  w/ Resp Event 0 0.0  Spontaneous 1 0.2  Total 1 0.2      Titration Table:  [facility]    General Information  Name: Thomas Harding, Thomas Harding: 30 Physician: AtStar AgeID: [id number]: [id number] Technician: FiRichard MiuSex: Male Weight: 213 lb Record: [id number]  Age: 46301/1957/08/23Date: 10/15/2022 Scorer: ShRichard Miu Recommended Settings IPAP: N/A cmH20 EPAP: N/A cmH2O AHI: N/A AHI (4%): N/A  Pressure IPAP/EPAP 00 04 / 00 05 07 08 09 10 11   O2 Vol 0.0 0.0 0.0 0.0 0.0 0.0 0.0 0.0  Time TRT 0.87744m1.04m9.18m473m.18m 38m7744m 3673mm 6448m 80.8m  TS418m.18m 0.18m 918mm 118mm 4244mm 36773m 62.26m77.0444mSlee29mtage17744mWake 0.0 100.0 86.4 0.0 17.4 0.0 3.9 3.8   % REM 0.0 0.0 0.0 0.0 0.0 47.9 8.1 32.5   % N1 0.0 0.0 31.3 0.0 5.6 2.7 2.4 3.2   % N2 0.0 0.0 68.8 100.0 48.9 35.6 62.9 64.3   % N3 0.0 0.0 0.0 0.0 45.6 13.7 26.6 0.0  Respiratory Total Events 0 0 '3 3 5 4 3 3   '$ Obs. Apn. 0 0 0 0 0 0 0  0   Mixed Apn. 0 0 0 0 0 0 0 0   Cen. Apn. 0 0 0 0 0 0 1 0   Hypopneas 0 0 '3 3 5 4 2 3   '$ AHI 0.00 0.00 22.50 12.86 6.67 6.58 2.90 2.34   Supine AHI 0.00 0.00 22.50 12.86 10.21 0.00 0.00 0.00   Prone AHI 0.00 0.00 0.00 0.00 0.00 0.00 0.00 0.00   Side AHI 0.00 0.00 0.00 0.00 2.79 6.58 2.90 2.34  Respiratory (4%) Hypopneas (4%) 0.00 0.00 3.00 3.00 5.00 4.00 2.00 3.00   AHI (4%) 0.00 0.00 22.50 12.86 6.67 6.58 2.90 2.34   Supine AHI (4%) 0.00 0.00 22.50 12.86 10.21 0.00 0.00 0.00   Prone AHI (4%) 0.00 0.00 0.00 0.00 0.00 0.00 0.00 0.00   Side AHI (4%) 0.00 0.00 0.00 0.00 2.79 6.58 2.90 2.34  Desat Profile <= 90% 0.18m 0.44m 0.44m 0.048m.44m473m73m 444mm 376m   844m80%79m18m 57744mm 067744m 0.18m 4.18m 0773m 0.26m0.0644m <=34m% 0844m 0.77744m0.0844m.18m418m18m 0.18m 0.18m16m18m 544m= 6644m0.036m.44m47m18m 6mm 486m 0.36m0.18m 0.18m  A718msal544mdex244mnea673m0 0244m0.067m0 0318m0.087m0 0.0   Hypopnea 0.0 0.0 7.5 4.3 2.7 1.6 1.0 0.0   LM 0.0 0.0 0.0 0.0 0.0 0.0 0.0 0.0   Spontaneous 0.0 0.0 15.0 17.1 12.0 6.6 9.7 7.0   Pressure IPAP/EPAP '12 13 15   '$ O2 Vol 0.0 0.0 0.0  Time TRT 21.18m 15.18m 61.18m   TST 20.7744m 11.7744m 44.18m  Sleep St67744m % W47744m 2.444m.3 27.9   218mEM 747m 47.47744m.0   % N1 0.0 30.4 22.7   % N2 22.0 21.7 77.3   % N3 0.0 0.0 0.0  Respiratory Total Events '4 4 1   '$ Obs. Apn. 0 0 0   Mixed Apn. 0 0 0   Cen. Apn. 1 0 0   Hypopneas '3 4 1   '$ AHI 11.71 20.87 1.36   Supine AHI 0.00 30.00 0.00   Prone AHI 0.00 0.00 0.00   Side AHI 11.71 10.91 1.67  Respiratory (4%) Hypopneas (4%) 3.00 4.00 1.00   AHI (4%) 11.71 20.87 1.36   Supine AHI (4%) 0.00 30.00 0.00   Prone AHI (4%) 0.00 0.00 0.00   Side AHI (4%) 11.71 10.91 1.67  Desat Profile <= 90% 0.18m 0.44m 0.44m   <= 80% 0.18m 0.18m 0.18m   <= 70% 0.18m 0.0444m.18m16m<= 54m 0.18m 0.18m 0873m  A87msal344mdex Apnea 0.544m.0 373m   69mopnea 0.0 0.87744m.4 573mM 094m0.0 0.0   Spontaneous 5.9 26.1 17.7

## 2022-10-22 ENCOUNTER — Ambulatory Visit: Payer: Medicare Other | Admitting: Neurology

## 2022-10-22 ENCOUNTER — Encounter: Payer: Self-pay | Admitting: Neurology

## 2022-10-22 ENCOUNTER — Telehealth: Payer: Self-pay | Admitting: *Deleted

## 2022-10-22 VITALS — BP 133/78 | HR 69 | Ht 71.0 in | Wt 232.4 lb

## 2022-10-22 DIAGNOSIS — G6289 Other specified polyneuropathies: Secondary | ICD-10-CM | POA: Diagnosis not present

## 2022-10-22 DIAGNOSIS — I493 Ventricular premature depolarization: Secondary | ICD-10-CM

## 2022-10-22 DIAGNOSIS — R200 Anesthesia of skin: Secondary | ICD-10-CM

## 2022-10-22 DIAGNOSIS — M5416 Radiculopathy, lumbar region: Secondary | ICD-10-CM

## 2022-10-22 DIAGNOSIS — G4733 Obstructive sleep apnea (adult) (pediatric): Secondary | ICD-10-CM | POA: Diagnosis not present

## 2022-10-22 DIAGNOSIS — R799 Abnormal finding of blood chemistry, unspecified: Secondary | ICD-10-CM | POA: Diagnosis not present

## 2022-10-22 NOTE — Telephone Encounter (Signed)
Spoke to patient in office Pt had office visit today with Dr Rexene Alberts . Gave sleep study results. Pt chose [facility] for DME. Informed patient of insurance compliance. Wear CPAP 4+ hours nightly and come to all f/u appointments. . Initial f/u was made at check out . Faxed orders to [facility] this afternoon

## 2022-10-22 NOTE — Telephone Encounter (Signed)
-----   Message from Star Age, MD sent at 10/21/2022  6:53 PM EST ----- Patient had a CPAP titration study on 10/15/22.  Please call and inform patient that I have entered an order for treatment with positive airway pressure (PAP) treatment for obstructive sleep apnea (OSA). He did well during the latest sleep study with CPAP. We will, therefore, arrange for a machine for home use through a DME (durable medical equipment) company of His choice; and I will see the patient back in follow-up in about 10 weeks. Please also explain to the patient that I will be looking out for compliance data, which can be downloaded from the machine (stored on an SD card, that is inserted in the machine) or via remote access through a modem, that is built into the machine. At the time of the followup appointment we will discuss sleep study results and how it is going with PAP treatment at home. Please advise patient to bring His machine at the time of the first FU visit, even though this is cumbersome. Bringing the machine for every visit after that will likely not be needed, but often helps for the first visit to troubleshoot if needed. Please re-enforce the importance of compliance with treatment and the need for [place] to monitor compliance data - often an insurance requirement and actually good feedback for the patient as far as how they are doing.  Also remind patient, that any interim PAP machine or mask issues should be first addressed with the DME company, as they can often help better with technical and mask fit issues. Please ask if patient has a preference regarding DME company.  Please also make sure, the patient has a follow-up appointment with me in about 10 weeks from the setup date, thanks. May see one of our nurse practitioners if needed for proper timing of the FU appointment.  Please fax or rout report to the referring provider. Thanks,   Star Age, MD, PhD [facility])

## 2022-10-22 NOTE — Patient Instructions (Addendum)
It was nice to see you again today.  I believe your symptoms are in part from nerve damage from the lower back, called radiculopathy and longstanding history of sciatica and pinched nerve type changes which likely resulted in chronic changes in your nerve roots.  You may also have a condition called peripheral neuropathy which is nerve damage affecting both feet.  Unfortunately, as I mentioned, there is no specific treatment for most neuropathies. The most common cause for neuropathy is diabetes in this [place], in which case, tight glucose control is key.  Some studies suggest that obesity and prediabetes can also cause nerve damage even in the absence of a formal diagnosis of diabetes.    Other causes include thyroid disease, and some vitamin deficiencies. Certain medications such as chemotherapy agents and other chemicals or toxins including alcohol can cause neuropathy. There are some genetic conditions or hereditary neuropathies. Typically patients will report a family history of neuropathy in those conditions. There are cases associated with cancers and autoimmune conditions. Most neuropathies are progressive unless a root cause can be found and treated, which is rare, as I explained. For most neuropathies there is no actual cure or reversing of symptoms. Painful neuropathy can be difficult to treat symptomatically, but there are some medications available to ease the symptoms.  Thankfully, you have no significant pain symptoms at this time and can be monitored for symptoms, such as nerve pain.    Electrophysiologic testing with nerve conduction velocity studies and EMG (muscle testing) do not always pick up neuropathies that affect the smallest fibers. Other common tests include different type of blood work, and rarely, spinal fluid testing, and sometimes we resort to asking for a nerve and muscle biopsy.  I may ask for a specialist input from a neuromuscular specialist, sometimes we make referrals to  [facility] or [facility] or [facility].  For now, as discussed, we will proceed with further work-up from my end of things: We will check blood work today and call you with the test results.   I may ask for you to repeat your EMG and nerve conduction velocity test through our office.   As far as your sleep apnea, I will go ahead and send the order for your CPAP machine to a local DME company and we will make an appointment for sleep apnea recheck and compliance visit in about 3 months from now.  When you get your new machine, please make sure that you use it consistently. While your insurance requires that you use CPAP at least 4 hours each night on 70% of the nights, I recommend, that you not skip any nights and use it throughout the night if you can. Getting used to CPAP and staying with the treatment long term does take time and patience and discipline. Untreated obstructive sleep apnea when it is moderate to severe can have an adverse impact on cardiovascular health and raise her risk for heart disease, arrhythmias, hypertension, congestive heart failure, stroke and diabetes. Untreated obstructive sleep apnea causes sleep disruption, nonrestorative sleep, and sleep deprivation. This can have an impact on your day to day functioning and cause daytime sleepiness and impairment of cognitive function, memory loss, mood disturbance, and problems focussing. Using CPAP regularly can improve these symptoms.

## 2022-10-22 NOTE — Progress Notes (Signed)
Subjective:    Patient ID: Thomas Harding is a 67 y.o. male.  HPI    Interim history: Thomas Harding is a 67 year old right-handed gentleman with an underlying medical history of hyperlipidemia, PVCs, bradycardia, history of paroxysmal atrial tachycardia, atypical chest pain and mild obesity, who presents for a new problem visit of neuropathy.  He is referred by his primary care physician.  I have recently evaluated the patient at the request of his cardiologist for sleep apnea concern.    Today, 10/22/2022: he reports a longstanding history of sciatica affecting his left leg, he has had more symptoms on the left side in general, he has had numbness affecting both feet and distal legs for the past at least 1 year, symptoms are bilateral, not significant pain but some tingling is reported, mostly numb.  He has not fallen but has had some mishaps while walking, he has to really look where he places his feet.  He does climb ladders when he is at work.  He has been advised by his primary care and orthopedic doctor not to climb any ladders.  He has no history of diabetes, has been labeled prediabetic at times, he has no strong family history of neuropathy but does report that his younger brother has some form of neuropathy and he is also not diabetic.  He has occasional cramping in the distal lower extremities, left more than right.  He has no history of alcohol use disorder, no excessive use, no binge drinking, drinks very infrequently in fact.  He tries to hydrate well with water. He just finished a 3-day heart monitor, results are not in yet. We talked about his recent sleep study results today as well.  He is okay to proceed with home CPAP therapy.  He has no preference as to his DME company.  He has been seeing Dr. Susa Day with orthopedics.  He had an EMG and NCV test through [facility] on 08/12/2022 which showed: <<Impression: Abnormal study.   Electrodiagnostic evidence of a chronic lumbosacral  radiculopathy, affecting the left L2, L5 and S1 nerve roots. No active denervation or axonal injury noted in the muscles supplied by the respective nerve roots.   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    Absent left sural sensory nerve conduction study. This can be a normal finding in age > 66 yrs old.    No electrodiagnostic evidence of a myopathy in the left lower extremity.    No electrodiagnostic evidence of a common peroneal motor entrapment neuropathy at the level of the left fibular head.    Electrodiagnostic evidence of a right common peroneal motor peripheral neuropathy, with demyelinating and axonal injury noted.    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    Absent right sural sensory nerve conduction study. This can be a normal finding in age > 30 yrs old. >>  He had an MRI of the lumbar spine which reportedly showed moderate to severe facet arthropathy at L5-S1 with a previous decompression surgery on the left.  He received a cortisone injection to the left knee.  He was advised to continue with his B complex supplement.    His baseline sleep study from 07/23/2022 showed moderate to severe obstructive sleep apnea with a total AHI of 26.5/h, REM AHI 45.3/h, O2 nadir 82%.    He had a CPAP titration study  on 10/15/2021, which showed good results with CPAP of 15 cm via fullface mask.  I have placed an order for CPAP therapy for home use.  He had blood work through his primary care on 08/08/2022 and I was able to review the results: Hemoglobin A1c was 6.1, TSH 1.42, BMP showed elevated blood sugar at 198, BUN elevated at 125, creatinine 1.33, CBC without differential unremarkable.  A B12 level was ordered in December 2023 but not drawn.  The patient's allergies, current medications,  family history, past medical history, past social history, past surgical history and problem list were reviewed and updated as appropriate.   Previously:   06/04/22: (He) reports some daytime tiredness, nonrestorative sleep and witnessed apneas.  I reviewed your office note from 04/26/2022.  His Epworth sleepiness score is 7 out of 24, fatigue severity score is 26 out of 63.  He is not sure if he snores, his wife has not complained about it.  He has rarely had a morning headache, does not wake up fully rested.  He has nocturia every other night, usually only once.  He drinks caffeine in moderation, 1 cup of coffee in the morning, 1 or 2 bottles of green tea, 1 iced tea with dinner.  He drinks soda occasionally, he drinks alcohol rarely, maybe once or twice a month, he is a non-smoker.  He lives with his wife, he has 2 grown children, 1 dog in the household.  He has a TV in the bedroom and typically is on at night, helps him fall asleep he believes.  He has a 30-minute timer on it.  He had a tonsillectomy as a child.  Weight has been more or less stable within 5 pound difference, overall gained about 5 pounds in the last 3 years.  Bedtime is generally between midnight and 12:30 AM but he admits that he often already has slept on the couch for an hour and a half or so.  Rise time is around 7:30 AM.  He does wake up with his wife at 56 or 6 AM but goes back to bed.  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  On a recent golf trip, his friend had mentioned long apneic pauses to him.  He is not aware of any family history of sleep apnea.    His Past Medical History Is Significant For: Past Medical History:  Diagnosis Date   Hyperlipidemia    Leg pain    OSA (obstructive sleep apnea)    PVC (premature ventricular contraction)    Squamous cell carcinoma of skin 01/16/2017   in situ- right crown     His Past Surgical History Is Significant For: Past  Surgical History:  Procedure Laterality Date   LEFT HEART CATH AND CORONARY ANGIOGRAPHY N/A 05/14/2022   Procedure: LEFT HEART CATH AND CORONARY ANGIOGRAPHY;  Surgeon: Nigel Mormon, MD;  Location: [facility];  Service: Cardiovascular;  Laterality: N/A;   LUMBAR LAMINECTOMY/DECOMPRESSION MICRODISCECTOMY Left 05/18/2020   Procedure: Microlumbar decompression Lumbar Four- Lumbar Five, Lumbar Five- Sacral One left;  Surgeon: Susa Day, MD;  Location: [facility];  Service: Orthopedics;  Laterality: Left;  Microlumbar decompression Lumbar Five- Sacral One left   PVC ABLATION N/A 09/11/2022   Procedure: PVC ABLATION;  Surgeon: Evans Lance, MD;  Location: [facility];  Service: Cardiovascular;  Laterality: N/A;   ROTATOR CUFF REPAIR Right    thumb surgery Left  TONSILLECTOMY     VASECTOMY      His Family History Is Significant For: Family History  Problem Relation Age of Onset   Hypertension Mother    Hyperlipidemia Father    Neuropathy Brother    Sleep apnea Neg Hx     His Social History Is Significant For: Social History   Socioeconomic History   Marital status: Married    Spouse name: Not on file   Number of children: 2   Years of education: Not on file   Highest education level: Not on file  Occupational History   Not on file  Tobacco Use   Smoking status: Never   Smokeless tobacco: Never  Vaping Use   Vaping Use: Never used  Substance and Sexual Activity   Alcohol use: Yes    Comment: occ   Drug use: No   Sexual activity: Yes  Other Topics Concern   Not on file  Social History Narrative   Not on file   Social Determinants of Health   Financial Resource Strain: Not on file  Food Insecurity: Not on file  Transportation Needs: Not on file  Physical Activity: Not on file  Stress: Not on file  Social Connections: Not on file    His Allergies Are:  Allergies  Allergen Reactions   Bee Venom Anaphylaxis   Penicillins Hives and Swelling  :    His Current Medications Are:  Outpatient Encounter Medications as of 10/22/2022  Medication Sig   acetaminophen (TYLENOL) 500 MG tablet Take 1,000 mg by mouth daily as needed for moderate pain.   Alpha-Lipoic Acid 600 MG CAPS Take 1,200 mg by mouth daily.   amLODipine (NORVASC) 5 MG tablet Take 1 tablet (5 mg total) by mouth daily.   benzonatate (TESSALON) 100 MG capsule Take 1 capsule (100 mg total) by mouth 3 (three) times daily as needed for cough.   DM-Doxylamine-Acetaminophen (NYQUIL COLD & FLU PO) Take 1 % by mouth 1 day or 1 dose.   metoprolol succinate (TOPROL-XL) 50 MG 24 hr tablet Take 1 tablet (50 mg total) by mouth daily. Take with or immediately following a meal.   omeprazole (PRILOSEC) 20 MG capsule Take 1 capsule (20 mg total) by mouth daily.   OVER THE COUNTER MEDICATION Take 2 capsules by mouth daily. H.A. Joint formula   rosuvastatin (CRESTOR) 20 MG tablet TAKE 1 TABLET BY MOUTH DAILY   predniSONE (DELTASONE) 20 MG tablet Take 3 tablets (60 mg total) by mouth daily with breakfast. (Patient not taking: Reported on 10/10/2022)   promethazine-dextromethorphan (PROMETHAZINE-DM) 6.25-15 MG/5ML syrup Take 5 mLs by mouth 4 (four) times daily as needed for cough.   No facility-administered encounter medications on file as of 10/22/2022.  :  Review of Systems:  Out of a complete 14 point review of systems, all are reviewed and negative with the exception of these symptoms as listed below:  Review of Systems  Neurological:        Pt here for Neuropathy pain  Pt states tingling and numbness in both feet and toes  Pt states some numbness in left shin     Objective:  Neurological Exam  Physical Exam Physical Examination:   Vitals:   10/22/22 1436  BP: 133/78  Pulse: 69    General Examination: The patient is a very pleasant 67 y.o. male in no acute distress. He appears well-developed and well-nourished and well groomed.   HEENT: Normocephalic, atraumatic, pupils are  equal, round and reactive to light,  extraocular tracking is good without limitation to gaze excursion or nystagmus noted.  Corrective eyeglasses in place.  Hearing is grossly intact. Face is symmetric with normal facial animation. Speech is clear with no dysarthria noted. There is no hypophonia. There is no lip, neck/head, jaw or voice tremor. Neck is supple with full range of passive and active motion. There are no carotid bruits on auscultation. Oropharynx exam reveals: Stable findings.     Chest: Clear to auscultation without wheezing, rhonchi or crackles noted.   Heart: S1+S2+0, regular and normal without murmurs, rubs or gallops noted.    Abdomen: Soft, non-tender and non-distended.   Extremities: There is no pitting edema, no discoloration, intact pedal pulses.    Skin: Warm and dry without trophic changes noted.    Musculoskeletal: exam reveals no obvious joint deformities.     Neurologically:  Mental status: The patient is awake, alert and oriented in all 4 spheres. His immediate and remote memory, attention, language skills and fund of knowledge are appropriate. There is no evidence of aphasia, agnosia, apraxia or anomia. Speech is clear with normal prosody and enunciation. Thought process is linear. Mood is normal and affect is normal.  Cranial nerves II - XII are as described above under HEENT exam.  Motor exam: Normal bulk, strength and tone is noted. There is no obvious tremor.  Fine motor skills and coordination: grossly intact.  Cerebellar testing: No dysmetria or intention tremor. There is no truncal or gait ataxia.  Sensory exam: intact to light touch in the upper and lower extremities, decreased sensation to pinprick and vibration sense as well as temperature sense in both distal lower extremities, left leg up to below knee area, right leg to mid shin area, normal findings in the upper extremities bilaterally.  Reflexes are 1+ in the upper extremities, trace in both knees,  absent in both ankles, toes are equivocal bilaterally.  Gait, station and balance: He stands easily. No veering to one side is noted. No leaning to one side is noted. Posture is slightly stooped in the lower back, no obvious change, does not walk with a walking aid.  Some difficulty pulling himself on the tiptoes.     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  Symptoms are worse on the left side, examination findings are in keeping with neuropathy affecting both lower extremities, left side worse than right.  History and examination support primarily lumbar radiculopathy but also superimposed or independent peripheral neuropathy for which there has not been an obvious cause.  I had a long discussion with the patient regarding causes of neuropathy and further workup.  He is not diabetic, has at times been labeled as prediabetic and has had higher blood sugar values.  His younger brother may have neuropathy as well.  Thankfully, he has no significant weakness, he has no significant pain, of note, he has tried gabapentin in the past when he was suffering from severe sciatica pain.  He did not tolerate it very well even at a lower dose as he recalls.  He is not keen on starting any new  medication, I have advised him that his EMG and nerve conduction velocity test confirms radiculopathy particularly on the left side but also shows evidence of neuropathy, it may be worthwhile repeating some components of his EMG/nerve conduction velocity test through  our office in the near future. We will proceed with blood work today, we will include vitamin B12, check for autoimmune markers, inflammatory markers, CK level as well.  We will keep him posted as to his test results by phone call.  As to his sleep apnea, he has recently undergone a CPAP titration study we talked about how well he did during the study.  He is willing to proceed with CPAP therapy at home and we will send the order to a local DME provider.  Hopefully he will get on CPAP soon and we will plan a follow-up within 3 months of starting CPAP therapy at home.  He recently completed a 3-day heart monitor, he still has PVCs, they were also evident on his recent sleep study.  Nevertheless, thankfully, he is feeling better after his ablation, he may need another procedure as he recalls his discussion with his cardiologist.  We will do blood work today, consider repeat EMG and nerve conduction velocity testing, I will run this by one of my neuromuscular colleagues first.  We will plan a follow-up in sleep clinic and call him with test results in the interim.  I answered all his questions today and he was in agreement.  I spent 45 minutes in total face-to-face time and in reviewing records during pre-charting, more than 50% of which was spent in counseling and coordination of care, reviewing test results, reviewing medications and treatment regimen and/or in discussing or reviewing the diagnosis of PN, OSA, radiculopathy, the prognosis and treatment options. Pertinent laboratory and imaging test results that were available during this visit with the patient were reviewed by me and considered in my medical decision making (see chart for details).

## 2022-10-24 DIAGNOSIS — I493 Ventricular premature depolarization: Secondary | ICD-10-CM | POA: Diagnosis not present

## 2022-10-28 ENCOUNTER — Telehealth: Payer: Self-pay | Admitting: Neurology

## 2022-10-28 ENCOUNTER — Telehealth: Payer: Self-pay | Admitting: *Deleted

## 2022-10-28 LAB — MULTIPLE MYELOMA PANEL, SERUM
Albumin SerPl Elph-Mcnc: 3.8 g/dL (ref 2.9–4.4)
Albumin/Glob SerPl: 1.1 (ref 0.7–1.7)
Alpha 1: 0.2 g/dL (ref 0.0–0.4)
Alpha2 Glob SerPl Elph-Mcnc: 0.9 g/dL (ref 0.4–1.0)
B-Globulin SerPl Elph-Mcnc: 1.2 g/dL (ref 0.7–1.3)
Gamma Glob SerPl Elph-Mcnc: 1.2 g/dL (ref 0.4–1.8)
Globulin, Total: 3.6 g/dL (ref 2.2–3.9)
IgA/Immunoglobulin A, Serum: 179 mg/dL (ref 61–437)
IgG (Immunoglobin G), Serum: 1057 mg/dL (ref 603–1613)
IgM (Immunoglobulin M), Srm: 328 mg/dL — ABNORMAL HIGH (ref 20–172)

## 2022-10-28 LAB — COMPREHENSIVE METABOLIC PANEL
ALT: 29 IU/L (ref 0–44)
AST: 28 IU/L (ref 0–40)
Albumin/Globulin Ratio: 1.6 (ref 1.2–2.2)
Albumin: 4.5 g/dL (ref 3.9–4.9)
Alkaline Phosphatase: 103 IU/L (ref 44–121)
BUN/Creatinine Ratio: 16 (ref 10–24)
BUN: 16 mg/dL (ref 8–27)
Bilirubin Total: 1 mg/dL (ref 0.0–1.2)
CO2: 23 mmol/L (ref 20–29)
Calcium: 9.4 mg/dL (ref 8.6–10.2)
Chloride: 100 mmol/L (ref 96–106)
Creatinine, Ser: 1.03 mg/dL (ref 0.76–1.27)
Globulin, Total: 2.9 g/dL (ref 1.5–4.5)
Glucose: 122 mg/dL — ABNORMAL HIGH (ref 70–99)
Potassium: 4.1 mmol/L (ref 3.5–5.2)
Sodium: 140 mmol/L (ref 134–144)
Total Protein: 7.4 g/dL (ref 6.0–8.5)
eGFR: 81 mL/min/{1.73_m2} (ref >59)

## 2022-10-28 LAB — HEAVY METALS PROFILE II, BLOOD
Arsenic: 2 ug/L (ref 0–9)
Cadmium: <0.5 ug/L (ref 0.0–1.2)
Lead, Blood: <1 ug/dL (ref 0.0–3.4)
Mercury: <1 ug/L (ref 0.0–14.9)

## 2022-10-28 LAB — RHEUMATOID FACTOR: Rheumatoid fact SerPl-aCnc: 21.3 IU/mL — ABNORMAL HIGH (ref <14.0)

## 2022-10-28 LAB — VITAMIN B1: Thiamine: 116.2 nmol/L (ref 66.5–200.0)

## 2022-10-28 LAB — RPR: RPR Ser Ql: NONREACTIVE

## 2022-10-28 LAB — ANA W/REFLEX: Anti Nuclear Antibody (ANA): NEGATIVE

## 2022-10-28 LAB — AMMONIA: Ammonia: 40 ug/dL (ref 40–200)

## 2022-10-28 LAB — VITAMIN D 25 HYDROXY (VIT D DEFICIENCY, FRACTURES): Vit D, 25-Hydroxy: 14.9 ng/mL — ABNORMAL LOW (ref 30.0–100.0)

## 2022-10-28 LAB — HGB A1C W/O EAG: Hgb A1c MFr Bld: 6.1 % — ABNORMAL HIGH (ref 4.8–5.6)

## 2022-10-28 LAB — HIV ANTIBODY (ROUTINE TESTING W REFLEX): HIV Screen 4th Generation wRfx: NONREACTIVE

## 2022-10-28 LAB — CK: Total CK: 144 U/L (ref 41–331)

## 2022-10-28 LAB — B12 AND FOLATE PANEL
Folate: 17 ng/mL (ref >3.0)
Vitamin B-12: 335 pg/mL (ref 232–1245)

## 2022-10-28 LAB — C-REACTIVE PROTEIN: CRP: 2 mg/L (ref 0–10)

## 2022-10-28 LAB — SEDIMENTATION RATE: Sed Rate: 7 mm/hr (ref 0–30)

## 2022-10-28 LAB — VITAMIN B6: Vitamin B6: 6.8 ug/L (ref 3.4–65.2)

## 2022-10-28 NOTE — Telephone Encounter (Signed)
-----  Message from Star Age, MD sent at 10/24/2022  4:44 PM EST ----- Please call patient and advise him that his vitamin D level was quite low, I would recommend that he start an over-the-counter vitamin D supplement, about 1000 to 2000 units daily for now.  He should follow-up with his primary care to discuss the possibility of taking prescription vitamin D for a few weeks.  His hemoglobin A1c which is a marker for diabetes and prediabetes was in the prediabetes range.  His inflammatory marker called rheumatoid factor was elevated which can be seen in patients with arthritis.  Other inflammatory markers were not elevated.  Liver function was fine, muscle enzymes were fine.  I would recommend follow-up with primary care to discuss input from rheumatology which may be reasonable.  He should also get his vitamin D level rechecked especially if he takes prescription vitamin D through primary care.  A few test results are not fully back yet which is the multiple myeloma panel which looks at the antibodies in the blood and their breakdown.  His vitamin B1 level is also not back yet, vitamin B6 and ammonia are also pending.  We will update if any of these 4 tests come back abnormal.

## 2022-10-28 NOTE — Telephone Encounter (Signed)
I called pt and relayed the results of the labs as per listed below:  he verbalizaed understanding.  Asked about [place]/EMG results (after discussion with provider).  I did not see this as yet will ask and get back with him.  He mentioned about still struggling everyday.  (Mentioned working from [employer]).    Star Age, MD 10/28/2022 10:14 AM EST Back to Top  Please call patient and advise him that his protein electrophoresis which looks at the breakdown of immunoglobulins in the blood showed a mildly abnormal finding with increase in IgM.  This is a nonspecific finding, and can be monitored.  He can discuss further with his primary care at the next appointment and see if he would favor input from the hematologist which we generally do not always recommend.  It may be worthwhile rechecking this test with PCP in 4 to 6 months.  Star Age, MD 10/24/2022  4:44 PM EST   Please call patient and advise him that his vitamin D level was quite low, I would recommend that he start an over-the-counter vitamin D supplement, about 1000 to 2000 units daily for now.  He should follow-up with his primary care to discuss the possibility of taking prescription vitamin D for a few weeks.  His hemoglobin A1c which is a marker for diabetes and prediabetes was in the prediabetes range.  His inflammatory marker called rheumatoid factor was elevated which can be seen in patients with arthritis.  Other inflammatory markers were not elevated.  Liver function was fine, muscle enzymes were fine.  I would recommend follow-up with primary care to discuss input from rheumatology which may be reasonable.  He should also get his vitamin D level rechecked especially if he takes prescription vitamin D through primary care.  A few test results are not fully back yet which is the multiple myeloma panel which looks at the antibodies in the blood and their breakdown.  His vitamin B1 level is also not back yet, vitamin B6 and ammonia are also pending.   We will update if any of these 4 tests come back abnormal.

## 2022-10-28 NOTE — Telephone Encounter (Signed)
I have requested input from Dr. Krista Blue as to whether repeating his EMG through our office would be feasible.  We will keep him posted.  I have copied Dr. Krista Blue on this.

## 2022-10-28 NOTE — Telephone Encounter (Signed)
Dr. Krista Blue kindly reviewed his EMG test report from [facility]  She did not recommend repeating his EMG and nerve conduction velocity test quite yet, we can consider this in the future.  Please update patient and advise him to follow-up as scheduled.

## 2022-10-29 NOTE — Telephone Encounter (Signed)
Spoke with patient and discussed message from Dr Rexene Alberts. Pt is ok with plan to re-evaluate need for EMG/NCV repeat in future as Dr Krista Blue reviewed results from [facility] and doesn't feel a repeat test is needed at this time. Follow-up as scheduled on 01/29/23 at 1:15 pm.   Dr Rexene Alberts, ok to plan to handle initial auto-pap and f/u for other issue in same April visit?

## 2022-10-29 NOTE — Telephone Encounter (Addendum)
Noted thanks. I had told pt I would only call him back to split into two appointments if needed. Otherwise he will plan to come for both at April appt.

## 2022-10-29 NOTE — Telephone Encounter (Signed)
Should be okay to do initial autoPAP visit and FU for PN in April, thanks.

## 2022-10-31 ENCOUNTER — Encounter: Payer: Self-pay | Admitting: Family Medicine

## 2022-11-05 NOTE — Telephone Encounter (Signed)
Pt is requesting to know dose for OTC supplementation with her still being in normal range just low end?

## 2022-11-11 ENCOUNTER — Other Ambulatory Visit: Payer: Self-pay | Admitting: Cardiology

## 2022-11-11 DIAGNOSIS — R0789 Other chest pain: Secondary | ICD-10-CM

## 2022-11-12 ENCOUNTER — Ambulatory Visit: Payer: Medicare Other | Attending: Internal Medicine | Admitting: Internal Medicine

## 2022-11-12 ENCOUNTER — Encounter: Payer: Self-pay | Admitting: Internal Medicine

## 2022-11-12 ENCOUNTER — Ambulatory Visit (INDEPENDENT_AMBULATORY_CARE_PROVIDER_SITE_OTHER): Payer: Medicare Other

## 2022-11-12 VITALS — BP 110/68 | HR 63 | Ht 71.0 in | Wt 232.0 lb

## 2022-11-12 DIAGNOSIS — I493 Ventricular premature depolarization: Secondary | ICD-10-CM | POA: Diagnosis not present

## 2022-11-12 NOTE — Patient Instructions (Addendum)
Medication Instructions:  Your physician recommends that you continue on your current medications as directed. Please refer to the Current Medication list given to you today.  *If you need a refill on your cardiac medications before your next appointment, please call your pharmacy*  Lab Work: None ordered.  If you have labs (blood work) drawn today and your tests are completely normal, you will receive your results only by: [place] (if you have MyChart) OR A paper copy in the mail If you have any lab test that is abnormal or we need to change your treatment, we will call you to review the results.  Testing/Procedures: Dr. Cristopher Peru ordered a 3 Day Zio Monitor prior to 1 year visit at [facility].    Follow-Up: At [facility], you and your health needs are our priority.  As part of our continuing mission to provide you with exceptional heart care, we have created designated Provider Care Teams.  These Care Teams include your primary Cardiologist (physician) and Advanced Practice Providers (APPs -  Physician Assistants and Nurse Practitioners) who all work together to provide you with the care you need, when you need it.  We recommend signing up for the patient portal called "MyChart".  Sign up information is provided on this After Visit Summary.  MyChart is used to connect with patients for Virtual Visits (Telemedicine).  Patients are able to view lab/test results, encounter notes, upcoming appointments, etc.  Non-urgent messages can be sent to your provider as well.   To learn more about what you can do with MyChart, go to [email or web address].    Your next appointment:   1 year(s)  You will wear a 3 day Zio monitor prior to this visit with Dr. Cristopher Peru   The format for your next appointment:   In Person  Provider:   Cristopher Peru, MD{or one of the following Advanced Practice Providers on your designated Care Team:   Tommye Standard, [place] Legrand Como "Jonni Sanger" Tillery,  PA-C    [place] Monitor Instructions  Your physician has requested you wear a ZIO patch monitor for 14 days.  This is a single patch monitor. [employer] supplies one patch monitor per enrollment. Additional stickers are not available. Please do not apply patch if you will be having a Nuclear Stress Test,  Echocardiogram, Cardiac CT, MRI, or Chest Xray during the period you would be wearing the  monitor. The patch cannot be worn during these tests. You cannot remove and re-apply the  ZIO XT patch monitor.  Your ZIO patch monitor will be mailed 3 day USPS to your address on file. It may take 3-5 days  to receive your monitor after you have been enrolled.  Once you have received your monitor, please review the enclosed instructions. Your monitor  has already been registered assigning a specific monitor serial # to you.  Billing and Patient Assistance Program Information  We have supplied [employer] with any of your insurance information on file for billing purposes. [employer] offers a sliding scale Patient Assistance Program for patients that do not have  insurance, or whose insurance does not completely cover the cost of the ZIO monitor.  You must apply for the Patient Assistance Program to qualify for this discounted rate.  To apply, please call [employer] at [phone], select option 4, select option 2, ask to apply for  Patient Assistance Program. Theodore Demark will ask your household income, and how many people  are in your household. They will quote your out-of-pocket  cost based on that information.  [employer] will also be able to set up a 71-month interest-free payment plan if needed.  Applying the monitor   Shave hair from upper left chest.  Hold abrader disc by orange tab. Rub abrader in 40 strokes over the upper left chest as  indicated in your monitor instructions.  Clean area with 4 enclosed alcohol pads. Let dry.  Apply patch as indicated in monitor instructions. Patch will be  placed under collarbone on left  side of chest with arrow pointing upward.  Rub patch adhesive wings for 2 minutes. Remove white label marked "1". Remove the white  label marked "2". Rub patch adhesive wings for 2 additional minutes.  While looking in a mirror, press and release button in center of patch. A small green light will  flash 3-4 times. This will be your only indicator that the monitor has been turned on.  Do not shower for the first 24 hours. You may shower after the first 24 hours.  Press the button if you feel a symptom. You will hear a small click. Record Date, Time and  Symptom in the Patient Logbook.  When you are ready to remove the patch, follow instructions on the last 2 pages of Patient  Logbook. Stick patch monitor onto the last page of Patient Logbook.  Place Patient Logbook in the blue and white box. Use locking tab on box and tape box closed  securely. The blue and white box has prepaid postage on it. Please place it in the mailbox as  soon as possible. Your physician should have your test results approximately 7 days after the  monitor has been [facility]  Call IOlowaluat [phone] you have questions regarding  your ZIO XT patch monitor. Call them immediately if you see an orange light blinking on your  monitor.  If your monitor falls off in less than 4 days, contact our Monitor department at [phone]  If your monitor becomes loose or falls off after 4 days call [employer] at [phone]  suggestions on securing your monitor

## 2022-11-12 NOTE — Progress Notes (Signed)
HPI Mr. Gravley returns today for followup. 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 He wore another cardiac monitor which demonstrated a reduction in the PVC's down to 13%. In the interim he notes that he does not have palpitations. His dyspnea is improved. Allergies  Allergen Reactions   Bee Venom Anaphylaxis   Penicillins Hives and Swelling     Current Outpatient Medications  Medication Sig Dispense Refill   acetaminophen (TYLENOL) 500 MG tablet Take 1,000 mg by mouth daily as needed for moderate pain.     Alpha-Lipoic Acid 600 MG CAPS Take 1,200 mg by mouth daily.     amLODipine (NORVASC) 5 MG tablet Take 1 tablet (5 mg total) by mouth daily. 90 tablet 3   benzonatate (TESSALON) 100 MG capsule Take 1 capsule (100 mg total) by mouth 3 (three) times daily as needed for cough. 20 capsule 0   DM-Doxylamine-Acetaminophen (NYQUIL COLD & FLU PO) Take 1 % by mouth 1 day or 1 dose.     metoprolol succinate (TOPROL-XL) 50 MG 24 hr tablet Take 1 tablet (50 mg total) by mouth daily. Take with or immediately following a meal. 90 tablet 3   omeprazole (PRILOSEC) 20 MG capsule Take 1 capsule (20 mg total) by mouth daily. 30 capsule 3   OVER THE COUNTER MEDICATION Take 2 capsules by mouth daily. H.A. Joint formula     rosuvastatin (CRESTOR) 20 MG tablet TAKE 1 TABLET BY MOUTH DAILY 90 tablet 0   No current facility-administered medications for this visit.     Past Medical History:  Diagnosis Date   Hyperlipidemia    Leg pain    OSA (obstructive sleep apnea)    PVC (premature ventricular contraction)    Squamous cell carcinoma of skin 01/16/2017   in situ- right crown     ROS:   All systems reviewed and negative except as  noted in the HPI.   Past Surgical History:  Procedure Laterality Date   LEFT HEART CATH AND CORONARY ANGIOGRAPHY N/A 05/14/2022   Procedure: LEFT HEART CATH AND CORONARY ANGIOGRAPHY;  Surgeon: Nigel Mormon, MD;  Location: [place] [facility];  Service: Cardiovascular;  Laterality: N/A;   LUMBAR LAMINECTOMY/DECOMPRESSION MICRODISCECTOMY Left 05/18/2020   Procedure: Microlumbar decompression Lumbar Four- Lumbar Five, Lumbar Five- Sacral One left;  Surgeon: Susa Day, MD;  Location: [place];  Service: Orthopedics;  Laterality: Left;  Microlumbar decompression Lumbar Five- Sacral One left   PVC ABLATION N/A 09/11/2022   Procedure: PVC ABLATION;  Surgeon: Evans Lance, MD;  Location: [place] [facility];  Service: Cardiovascular;  Laterality: N/A;   ROTATOR CUFF REPAIR Right    thumb surgery Left    TONSILLECTOMY     VASECTOMY       Family History  Problem Relation Age of Onset   Hypertension Mother    Hyperlipidemia Father    Neuropathy Brother    Sleep apnea Neg Hx      Social History   Socioeconomic History   Marital status: Married    Spouse name: Not on file   Number of children: 2   Years of education: Not on file   Highest education level: Not on file  Occupational History   Not on file  Tobacco Use   Smoking status: Never   Smokeless tobacco: Never  Vaping Use   Vaping Use: Never used  Substance and Sexual Activity   Alcohol use: Yes    Comment: occ   Drug use: No   Sexual activity: Yes  Other Topics Concern   Not on file  Social History Narrative   Not on file   Social Determinants of Health   Financial Resource Strain: Not on file  Food Insecurity: Not on file  Transportation Needs: Not on file  Physical Activity: Not on file  Stress: Not on file  Social Connections: Not on file  Intimate Partner Violence: Not on file     BP 110/68   Pulse 63   Ht '5\' 11"'$  (1.803 m)   Wt 232 lb (105.2 kg)   SpO2 98%   BMI 32.36 kg/m   Physical  Exam:  Well appearing NAD HEENT: Unremarkable Neck:  No JVD, no thyromegally Lymphatics:  No adenopathy Back:  No CVA tenderness Lungs:  Clear with no wheezes HEART:  Regular rate rhythm, no murmurs, no rubs, no clicks. I counted 50 heart beats on exam with no pvc's Abd:  soft, positive bowel sounds, no organomegally, no rebound, no guarding Ext:  2 plus pulses, no edema, no cyanosis, no clubbing Skin:  No rashes no nodules Neuro:  CN II through XII intact, motor grossly intact  EKG - nsr with no pvc  Assess/Plan  PVC's - I have discussed the treatment options. I have recommended watchful waiting and a repeat 3 day zio monitor in a year.  LV dysfunction - this was demonstrated by myoview but not by echo. He will undergo watchful waiting. If he develops worsening symptoms we will repeat his 2D echo.  Carleene Overlie Harue Pribble,MD

## 2022-11-12 NOTE — Progress Notes (Unsigned)
Enrolled for [employer] to mail a ZIO XT monitor 10/15/2023 to address on file. Patient to wear and return before 2025 follow up appointment.

## 2022-11-13 DIAGNOSIS — M25562 Pain in left knee: Secondary | ICD-10-CM | POA: Diagnosis not present

## 2022-11-13 DIAGNOSIS — M5417 Radiculopathy, lumbosacral region: Secondary | ICD-10-CM | POA: Diagnosis not present

## 2022-11-13 DIAGNOSIS — M5459 Other low back pain: Secondary | ICD-10-CM | POA: Diagnosis not present

## 2022-11-18 DIAGNOSIS — G4733 Obstructive sleep apnea (adult) (pediatric): Secondary | ICD-10-CM | POA: Diagnosis not present

## 2022-11-20 ENCOUNTER — Ambulatory Visit (INDEPENDENT_AMBULATORY_CARE_PROVIDER_SITE_OTHER): Payer: Medicare Other

## 2022-11-20 VITALS — Ht 71.0 in | Wt 225.0 lb

## 2022-11-20 DIAGNOSIS — Z0000 Encounter for general adult medical examination without abnormal findings: Secondary | ICD-10-CM

## 2022-11-20 DIAGNOSIS — Z6831 Body mass index (BMI) 31.0-31.9, adult: Secondary | ICD-10-CM | POA: Diagnosis not present

## 2022-11-20 NOTE — Progress Notes (Signed)
Virtual Visit via Telephone Note  I connected with  Thomas Harding on 11/20/22 at 11:00 AM EST by telephone and verified that I am speaking with the correct person using two identifiers.  Location: Patient: Home Provider: [place] Persons participating in the virtual visit: patient/Nurse Health Advisor   I discussed the limitations, risks, security and privacy concerns of performing an evaluation and management service by telephone and the availability of in person appointments. The patient expressed understanding and agreed to proceed.  Interactive audio and video telecommunications were attempted between this nurse and patient, however failed, due to patient having technical difficulties OR patient did not have access to video capability.  We continued and completed visit with audio only.  Some vital signs may be absent or patient reported.   Sheral Flow, LPN  Subjective:   Thomas Harding is a 67 y.o. male who presents for an Initial Medicare Annual Wellness Visit.  Review of Systems     Cardiac Risk Factors include: advanced age (>23mn, >>42women)     Objective:    Today's Vitals   11/20/22 1132  Weight: 225 lb (102.1 kg)  Height: '5\' 11"'$  (1.803 m)  PainSc: 0-No pain   Body mass index is 31.38 kg/m.     11/20/2022   11:39 AM 09/11/2022   12:45 PM 05/14/2022   10:49 AM 05/18/2020    6:51 AM 05/09/2020    3:36 PM  Advanced Directives  Does Patient Have a Medical Advance Directive? Yes No No No No  Type of AParamedicof [place] will      Copy of [place]? No - copy requested      Would patient like information on creating a medical advance directive?  No - Patient declined No - Patient declined No - Patient declined No - Patient declined    Current Medications (verified) Outpatient Encounter Medications as of 11/20/2022  Medication Sig   acetaminophen (TYLENOL) 500 MG tablet Take 1,000 mg by  mouth daily as needed for moderate pain.   Alpha-Lipoic Acid 600 MG CAPS Take 1,200 mg by mouth daily.   amLODipine (NORVASC) 5 MG tablet Take 1 tablet (5 mg total) by mouth daily.   Cholecalciferol (VITAMIN D3) 50 MCG (2000 UT) capsule Take 2,000 Units by mouth daily.   metoprolol succinate (TOPROL-XL) 50 MG 24 hr tablet Take 1 tablet (50 mg total) by mouth daily. Take with or immediately following a meal.   Misc Natural Products (TURMERIC CURCUMIN) CAPS Take by mouth. 1500 MG ONCE PER DAY   omeprazole (PRILOSEC) 20 MG capsule Take 1 capsule (20 mg total) by mouth daily.   OVER THE COUNTER MEDICATION Take 2 capsules by mouth daily. H.A. Joint formula   rosuvastatin (CRESTOR) 20 MG tablet TAKE 1 TABLET BY MOUTH DAILY   benzonatate (TESSALON) 100 MG capsule Take 1 capsule (100 mg total) by mouth 3 (three) times daily as needed for cough. (Patient not taking: Reported on 11/20/2022)   DM-Doxylamine-Acetaminophen (NYQUIL COLD & FLU PO) Take 1 % by mouth 1 day or 1 dose. (Patient not taking: Reported on 11/20/2022)   No facility-administered encounter medications on file as of 11/20/2022.    Allergies (verified) Bee venom and Penicillins   History: Past Medical History:  Diagnosis Date   Hyperlipidemia    Leg pain    OSA (obstructive sleep apnea)    PVC (premature ventricular contraction)    Squamous cell carcinoma of skin 01/16/2017  in situ- right crown    Past Surgical History:  Procedure Laterality Date   LEFT HEART CATH AND CORONARY ANGIOGRAPHY N/A 05/14/2022   Procedure: LEFT HEART CATH AND CORONARY ANGIOGRAPHY;  Surgeon: Nigel Mormon, MD;  Location: [facility];  Service: Cardiovascular;  Laterality: N/A;   LUMBAR LAMINECTOMY/DECOMPRESSION MICRODISCECTOMY Left 05/18/2020   Procedure: Microlumbar decompression Lumbar Four- Lumbar Five, Lumbar Five- Sacral One left;  Surgeon: Susa Day, MD;  Location: [facility];  Service: Orthopedics;  Laterality: Left;  Microlumbar  decompression Lumbar Five- Sacral One left   PVC ABLATION N/A 09/11/2022   Procedure: PVC ABLATION;  Surgeon: Evans Lance, MD;  Location: [facility];  Service: Cardiovascular;  Laterality: N/A;   ROTATOR CUFF REPAIR Right    thumb surgery Left    TONSILLECTOMY     VASECTOMY     Family History  Problem Relation Age of Onset   Hypertension Mother    Hyperlipidemia Father    Neuropathy Brother    Sleep apnea Neg Hx    Social History   Socioeconomic History   Marital status: Married    Spouse name: Not on file   Number of children: 2   Years of education: Not on file   Highest education level: Not on file  Occupational History   Not on file  Tobacco Use   Smoking status: Never   Smokeless tobacco: Never  Vaping Use   Vaping Use: Never used  Substance and Sexual Activity   Alcohol use: Yes    Comment: occ   Drug use: No   Sexual activity: Yes  Other Topics Concern   Not on file  Social History Narrative   Not on file   Social Determinants of Health   Financial Resource Strain: Low Risk  (11/20/2022)   Overall Financial Resource Strain (CARDIA)    Difficulty of Paying Living Expenses: Not hard at all  Food Insecurity: No Food Insecurity (11/20/2022)   Hunger Vital Sign    Worried About Running Out of Food in the Last Year: Never true    [place] in the Last Year: Never true  Transportation Needs: No Transportation Needs (11/20/2022)   PRAPARE - Hydrologist (Medical): No    Lack of Transportation (Non-Medical): No  Physical Activity: Unknown (11/20/2022)   Exercise Vital Sign    Days of Exercise per Week: Patient refused    Minutes of Exercise per Session: Patient refused  Stress: No Stress Concern Present (11/20/2022)   [place]    Feeling of Stress : Not at all  Social Connections: [place] (11/20/2022)   Social Connection and Isolation Panel  [NHANES]    Frequency of Communication with Friends and Family: More than three times a week    Frequency of Social Gatherings with Friends and Family: More than three times a week    Attends Religious Services: More than 4 times per year    Active Member of [employer] or Organizations: Yes    Attends Music therapist: More than 4 times per year    Marital Status: Married    Tobacco Counseling Counseling given: Not Answered   Clinical Intake:  Pre-visit preparation completed: Yes  Pain : No/denies pain Pain Score: 0-No pain     BMI - recorded: 31.38 Nutritional Status: BMI > 30  Obese Nutritional Risks: None Diabetes: No  How often do you need to  have someone help you when you read instructions, pamphlets, or other written materials from your doctor or pharmacy?: 1 - Never What is the last grade level you completed in school?: 16 YEARS  Diabetic? No  Interpreter Needed?: No  Information entered by :: Thoren Hosang N. Vercie Pokorny, LPN.   Activities of Daily Living    11/20/2022   11:42 AM  In your present state of health, do you have any difficulty performing the following activities:  Hearing? 0  Vision? 0  Difficulty concentrating or making decisions? 0  Walking or climbing stairs? 0  Dressing or bathing? 0  Doing errands, shopping? 0  Preparing Food and eating ? N  Using the Toilet? N  In the past six months, have you accidently leaked urine? N  Do you have problems with loss of bowel control? N  Managing your Medications? N  Managing your Finances? N  Housekeeping or managing your Housekeeping? N    Patient Care Team: [name], MD as PCP - General (Family Medicine) Evans Lance, MD as PCP - Electrophysiology (Cardiology) Rutherford Guys, MD as Consulting Physician (Ophthalmology) Evans Lance, MD as Consulting Physician (Cardiology)  Indicate any recent Medical Services you may have received from other than [facility] providers in the past year  (date may be approximate).     Assessment:   This is a routine wellness examination for Thomas Harding.  Hearing/Vision screen Hearing Screening - Comments:: Denies hearing difficulties.   Vision Screening - Comments:: Wears rx glasses - up to date with routine eye exams with Dr. Rutherford Guys   Dietary issues and exercise activities discussed: Current Exercise Habits: The patient has a physically strenuous job, but has no regular exercise apart from work., Exercise limited by: respiratory conditions(s);cardiac condition(s)   Goals Addressed             This Visit's Progress    Client understands the importance of follow-up with providers by attending scheduled visits.        Depression Screen    11/20/2022   11:44 AM 10/10/2022    9:06 AM 10/01/2022    9:10 AM 09/25/2022   10:08 AM 07/31/2022    2:48 PM 06/13/2022    2:49 PM 02/20/2022   10:48 AM  PHQ 2/9 Scores  PHQ - 2 Score 0 0 0 0 0 0 0  PHQ- 9 Score 0 0 0 0 0 0     Fall Risk    11/20/2022   11:40 AM 10/10/2022    9:06 AM 10/01/2022    9:10 AM 09/25/2022   10:08 AM 07/31/2022    2:49 PM  Fall Risk   Falls in the past year? 1 0 0 0 1  Number falls in past yr: 1 0 0 0 0  Injury with Fall? 1 0 0 0 1  Risk for fall due to : History of fall(s);Impaired mobility No Fall Risks No Fall Risks No Fall Risks History of fall(s)  Follow up Falls evaluation completed Falls evaluation completed Falls evaluation completed Falls evaluation completed Falls evaluation completed    [place]:  Any stairs in or around the home? Yes  If so, are there any without handrails? No  Home free of loose throw rugs in walkways, pet beds, electrical cords, etc? Yes  Adequate lighting in your home to reduce risk of falls? Yes   ASSISTIVE DEVICES UTILIZED TO PREVENT FALLS:  Life alert? No  Use of a cane, walker or  w/c? No  Grab bars in the bathroom? No  Shower chair or bench in shower? Yes  Elevated toilet  seat or a handicapped toilet? Yes   TIMED UP AND GO:  Was the test performed? No . Phone Visit   Cognitive Function:        11/20/2022   11:42 AM  6CIT Screen  What Year? 0 points  What month? 0 points  What time? 0 points  Count back from 20 0 points  Months in reverse 0 points  Repeat phrase 0 points  Total Score 0 points    Immunizations Immunization History  Administered Date(s) Administered   Fluad Quad(high Dose 65+) 07/31/2022   Influenza,inj,Quad PF,6+ Mos 06/24/2018, 07/26/2020, 08/20/2021   Moderna SARS-COV2 Booster Vaccination 08/21/2020   Moderna Sars-Covid-2 Vaccination 12/11/2019, 01/08/2020   PNEUMOCOCCAL CONJUGATE-20 07/31/2022   Tdap 03/15/2017    TDAP status: Up to date  Flu Vaccine status: Up to date  Pneumococcal vaccine status: Up to date  Covid-19 vaccine status: Completed vaccines  Qualifies for Shingles Vaccine? Yes   Zostavax completed No   Shingrix Completed?: No.    Education has been provided regarding the importance of this vaccine. Patient has been advised to call insurance company to determine out of pocket expense if they have not yet received this vaccine. Advised may also receive vaccine at local pharmacy or Health Dept. Verbalized acceptance and understanding.  Screening Tests Health Maintenance  Topic Date Due   COVID-19 Vaccine (3 - Moderna risk series) 09/18/2020   Zoster Vaccines- Shingrix (1 of 2) 12/25/2022 (Originally 11/02/1974)   Hepatitis C Screening  09/26/2023 (Originally 11/01/1973)   Medicare Annual Wellness (AWV)  11/21/2023   DTaP/Tdap/Td (2 - Td or Tdap) 03/16/2027   COLONOSCOPY (Pts 45-22yr Insurance coverage will need to be confirmed)  10/13/2028   Pneumonia Vaccine 67 Years old  Completed   INFLUENZA VACCINE  Completed   HPV VACCINES  Aged Out    Health Maintenance  Health Maintenance Due  Topic Date Due   COVID-19 Vaccine (3 - Moderna risk series) 09/18/2020    Colorectal cancer screening: Type of  screening: Colonoscopy. Completed 10/13/2018. Repeat every 10 years  Lung Cancer Screening: (Low Dose CT Chest recommended if Age 66-80years, 30 pack-year currently smoking OR have quit w/in 15years.) does not qualify.   Lung Cancer Screening Referral: no  Additional Screening:  Hepatitis C Screening: does qualify; Completed: no  Vision Screening: Recommended annual ophthalmology exams for early detection of glaucoma and other disorders of the eye. Is the patient up to date with their annual eye exam?  Yes  Who is the provider or what is the name of the office in which the patient attends annual eye exams? MRutherford Guys MD. If pt is not established with a provider, would they like to be referred to a provider to establish care? No .   Dental Screening: Recommended annual dental exams for proper oral hygiene  Community Resource Referral / Chronic Care Management: CRR required this visit?  No   CCM required this visit?  No      Plan:     I have personally reviewed and noted the following in the patient's chart:   Medical and social history Use of alcohol, tobacco or illicit drugs  Current medications and supplements including opioid prescriptions. Patient is not currently taking opioid prescriptions. Functional ability and status Nutritional status Physical activity Advanced directives List of other physicians Hospitalizations, surgeries, and ER visits in previous 12 months  Vitals Screenings to include cognitive, depression, and falls Referrals and appointments  In addition, I have reviewed and discussed with patient certain preventive protocols, quality metrics, and best practice recommendations. A written personalized care plan for preventive services as well as general preventive health recommendations were provided to patient.     Sheral Flow, LPN   02/14/3013   Nurse Notes:   Patient is cogitatively intact. There were no vitals filed for this visit. No  abnormalities found.

## 2022-11-20 NOTE — Patient Instructions (Addendum)
Thomas Harding , Thank you for taking time to come for your Medicare Wellness Visit. I appreciate your ongoing commitment to your health goals. Please review the following plan we discussed and let me know if I can assist you in the future.   These are the goals we discussed:  Goals      Client understands the importance of follow-up with providers by attending scheduled visits.     I would like to go to a nutritional/diet management program to help lose weight and better my eating habits.        This is a list of the screening recommended for you and due dates:  Health Maintenance  Topic Date Due   COVID-19 Vaccine (3 - Moderna risk series) 09/18/2020   Zoster (Shingles) Vaccine (1 of 2) 12/25/2022*   Hepatitis C Screening: USPSTF Recommendation to screen - Ages 18-79 yo.  09/26/2023*   Medicare Annual Wellness Visit  11/21/2023   DTaP/Tdap/Td vaccine (2 - Td or Tdap) 03/16/2027   Colon Cancer Screening  10/13/2028   Pneumonia Vaccine  Completed   Flu Shot  Completed   HPV Vaccine  Aged Out  *Topic was postponed. The date shown is not the original due date.    Advanced directives: Yes  Conditions/risks identified: Yes; Frequent Falls  Next appointment: Follow up in one year for your annual wellness visit.   Preventive Care 72 Years and Older, Male  Preventive care refers to lifestyle choices and visits with your health care provider that can promote health and wellness. What does preventive care include? A yearly physical exam. This is also called an annual well check. Dental exams once or twice a year. Routine eye exams. Ask your health care provider how often you should have your eyes checked. Personal lifestyle choices, including: Daily care of your teeth and gums. Regular physical activity. Eating a healthy diet. Avoiding tobacco and drug use. Limiting alcohol use. Practicing safe sex. Taking low doses of aspirin every day. Taking vitamin and mineral supplements as  recommended by your health care provider. What happens during an annual well check? The services and screenings done by your health care provider during your annual well check will depend on your age, overall health, lifestyle risk factors, and family history of disease. Counseling  Your health care provider may ask you questions about your: Alcohol use. Tobacco use. Drug use. Emotional well-being. Home and relationship well-being. Sexual activity. Eating habits. History of falls. Memory and ability to understand (cognition). Work and work Statistician. Screening  You may have the following tests or measurements: Height, weight, and BMI. Blood pressure. Lipid and cholesterol levels. These may be checked every 5 years, or more frequently if you are over 27 years old. Skin check. Lung cancer screening. You may have this screening every year starting at age 57 if you have a 30-pack-year history of smoking and currently smoke or have quit within the past 15 years. Fecal occult blood test (FOBT) of the stool. You may have this test every year starting at age 55. Flexible sigmoidoscopy or colonoscopy. You may have a sigmoidoscopy every 5 years or a colonoscopy every 10 years starting at age 16. Prostate cancer screening. Recommendations will vary depending on your family history and other risks. Hepatitis C blood test. Hepatitis B blood test. Sexually transmitted disease (STD) testing. Diabetes screening. This is done by checking your blood sugar (glucose) after you have not eaten for a while (fasting). You may have this done every 1-3 years.  Abdominal aortic aneurysm (AAA) screening. You may need this if you are a current or former smoker. Osteoporosis. You may be screened starting at age 30 if you are at high risk. Talk with your health care provider about your test results, treatment options, and if necessary, the need for more tests. Vaccines  Your health care provider may recommend  certain vaccines, such as: Influenza vaccine. This is recommended every year. Tetanus, diphtheria, and acellular pertussis (Tdap, Td) vaccine. You may need a Td booster every 10 years. Zoster vaccine. You may need this after age 52. Pneumococcal 13-valent conjugate (PCV13) vaccine. One dose is recommended after age 78. Pneumococcal polysaccharide (PPSV23) vaccine. One dose is recommended after age 82. Talk to your health care provider about which screenings and vaccines you need and how often you need them. This information is not intended to replace advice given to you by your health care provider. Make sure you discuss any questions you have with your health care provider. Document Released: 10/27/2015 Document Revised: 06/19/2016 Document Reviewed: 08/01/2015 Elsevier Interactive Patient Education  2017 [place] Prevention in the Home Falls can cause injuries. They can happen to people of all ages. There are many things you can do to make your home safe and to help prevent falls. What can I do on the outside of my home? Regularly fix the edges of walkways and driveways and fix any cracks. Remove anything that might make you trip as you walk through a door, such as a raised step or threshold. Trim any bushes or trees on the path to your home. Use bright outdoor lighting. Clear any walking paths of anything that might make someone trip, such as rocks or tools. Regularly check to see if handrails are loose or broken. Make sure that both sides of any steps have handrails. Any raised decks and porches should have guardrails on the edges. Have any leaves, snow, or ice cleared regularly. Use sand or salt on walking paths during winter. Clean up any spills in your garage right away. This includes oil or grease spills. What can I do in the bathroom? Use night lights. Install grab bars by the toilet and in the tub and shower. Do not use towel bars as grab bars. Use non-skid mats or  decals in the tub or shower. If you need to sit down in the shower, use a plastic, non-slip stool. Keep the floor dry. Clean up any water that spills on the floor as soon as it happens. Remove soap buildup in the tub or shower regularly. Attach bath mats securely with double-sided non-slip rug tape. Do not have throw rugs and other things on the floor that can make you trip. What can I do in the bedroom? Use night lights. Make sure that you have a light by your bed that is easy to reach. Do not use any sheets or blankets that are too big for your bed. They should not hang down onto the floor. Have a firm chair that has side arms. You can use this for support while you get dressed. Do not have throw rugs and other things on the floor that can make you trip. What can I do in the kitchen? Clean up any spills right away. Avoid walking on wet floors. Keep items that you use a lot in easy-to-reach places. If you need to reach something above you, use a strong step stool that has a grab bar. Keep electrical cords out of the way. Do not  use floor polish or wax that makes floors slippery. If you must use wax, use non-skid floor wax. Do not have throw rugs and other things on the floor that can make you trip. What can I do with my stairs? Do not leave any items on the stairs. Make sure that there are handrails on both sides of the stairs and use them. Fix handrails that are broken or loose. Make sure that handrails are as long as the stairways. Check any carpeting to make sure that it is firmly attached to the stairs. Fix any carpet that is loose or worn. Avoid having throw rugs at the top or bottom of the stairs. If you do have throw rugs, attach them to the floor with carpet tape. Make sure that you have a light switch at the top of the stairs and the bottom of the stairs. If you do not have them, ask someone to add them for you. What else can I do to help prevent falls? Wear shoes that: Do not  have high heels. Have rubber bottoms. Are comfortable and fit you well. Are closed at the toe. Do not wear sandals. If you use a stepladder: Make sure that it is fully opened. Do not climb a closed stepladder. Make sure that both sides of the stepladder are locked into place. Ask someone to hold it for you, if possible. Clearly mark and make sure that you can see: Any grab bars or handrails. First and last steps. Where the edge of each step is. Use tools that help you move around (mobility aids) if they are needed. These include: Canes. Walkers. Scooters. Crutches. Turn on the lights when you go into a dark area. Replace any light bulbs as soon as they burn out. Set up your furniture so you have a clear path. Avoid moving your furniture around. If any of your floors are uneven, fix them. If there are any pets around you, be aware of where they are. Review your medicines with your doctor. Some medicines can make you feel dizzy. This can increase your chance of falling. Ask your doctor what other things that you can do to help prevent falls. This information is not intended to replace advice given to you by your health care provider. Make sure you discuss any questions you have with your health care provider. Document Released: 07/27/2009 Document Revised: 03/07/2016 Document Reviewed: 11/04/2014 Elsevier Interactive Patient Education  2017 [employer].

## 2022-11-25 ENCOUNTER — Ambulatory Visit: Payer: Medicare Other | Admitting: Neurology

## 2022-11-28 ENCOUNTER — Ambulatory Visit: Payer: Medicare Other | Admitting: Physician Assistant

## 2022-12-17 DIAGNOSIS — G4733 Obstructive sleep apnea (adult) (pediatric): Secondary | ICD-10-CM | POA: Diagnosis not present

## 2023-01-17 DIAGNOSIS — G4733 Obstructive sleep apnea (adult) (pediatric): Secondary | ICD-10-CM | POA: Diagnosis not present

## 2023-01-19 ENCOUNTER — Other Ambulatory Visit: Payer: Self-pay | Admitting: Family Medicine

## 2023-01-19 DIAGNOSIS — K219 Gastro-esophageal reflux disease without esophagitis: Secondary | ICD-10-CM

## 2023-01-29 ENCOUNTER — Encounter: Payer: Self-pay | Admitting: Neurology

## 2023-01-29 ENCOUNTER — Ambulatory Visit: Payer: Medicare Other | Admitting: Neurology

## 2023-01-29 VITALS — BP 121/71 | HR 52 | Ht 71.0 in | Wt 235.4 lb

## 2023-01-29 DIAGNOSIS — R768 Other specified abnormal immunological findings in serum: Secondary | ICD-10-CM | POA: Diagnosis not present

## 2023-01-29 DIAGNOSIS — Z789 Other specified health status: Secondary | ICD-10-CM | POA: Diagnosis not present

## 2023-01-29 DIAGNOSIS — G6289 Other specified polyneuropathies: Secondary | ICD-10-CM

## 2023-01-29 DIAGNOSIS — G4733 Obstructive sleep apnea (adult) (pediatric): Secondary | ICD-10-CM | POA: Diagnosis not present

## 2023-01-29 DIAGNOSIS — E559 Vitamin D deficiency, unspecified: Secondary | ICD-10-CM | POA: Diagnosis not present

## 2023-01-29 DIAGNOSIS — R7689 Other specified abnormal immunological findings in serum: Secondary | ICD-10-CM

## 2023-01-29 NOTE — Patient Instructions (Addendum)
Please continue using your CPAP regularly. While your insurance requires that you use CPAP at least 4 hours each night on 70% of the nights, I recommend, that you not skip any nights and use it throughout the night if you can. Getting used to CPAP and staying with the treatment long term does take time and patience and discipline. Untreated obstructive sleep apnea when it is moderate to severe can have an adverse impact on cardiovascular health and raise her risk for heart disease, arrhythmias, hypertension, congestive heart failure, stroke and diabetes. Untreated obstructive sleep apnea causes sleep disruption, nonrestorative sleep, and sleep deprivation. This can have an impact on your day to day functioning and cause daytime sleepiness and impairment of cognitive function, memory loss, mood disturbance, and problems focussing. Using CPAP regularly can improve these symptoms. You are fully compliant with treatment, keep up the good work.  I recommend that you make an appointment with your DME provider for a mask refit, you may like the ResMed F30i fullface mask option. You can discuss with your primary care physician the possibility of using prescription vitamin D, continue with the over-the-counter supplement for now.  He may recommend input from rheumatology as your rheumatoid factor was positive but you may just be positive for this marker and not have actual rheumatoid arthritis. Your vitamin B12 level was also on the lower end of the normal spectrum.  Please continue to work on weight loss.    You can try Melatonin at night for sleep: take 1 mg to 3 mg, one to 2 hours before your bedtime. You can go up to 5 mg if needed. It is over the counter and comes in pill form, chewable form and spray, if you prefer.    Follow-up in 1 year routinely.

## 2023-01-29 NOTE — Progress Notes (Signed)
Subjective:    Patient ID: Thomas Harding is a 67 y.o. male.  HPI    Interim history:   Thomas Harding is a 67 year old right-handed gentleman with an underlying medical history of hyperlipidemia, PVCs, bradycardia, history of paroxysmal atrial tachycardia, atypical chest pain, sciatica, peripheral neuropathy, and mild obesity, who presents for follow-up consultation of his obstructive sleep apnea after interim sleep testing and starting home CPAP therapy.  The patient is unaccompanied today. I recently evaluated the patient on 10/22/22 for concern for neuropathy.    He had undergone EMG and nerve conduction velocity testing through [facility] in October 2023.  We did extensive blood work on 10/22/2022.  We checked HIV, vitamin D, A1c, ammonia, vitamin B6, ANA, CRP, rheumatoid factor, heavy metals, CK, ESR, RPR, CMP, multiple myeloma panel, vitamin B1, vitamin B12.  His vitamin D was low.,  Vitamin B12 was on the lower end of the spectrum.  Multiple myeloma panel showed a polyclonal increase in IgM.  Rheumatoid factor was elevated.  He was advised to discuss with PCP evaluation through rheumatology and hematology.  He was advised to discuss vitamin D supplementation as well but advised to start over-the-counter vitamin D in the meantime.  His baseline sleep study from October 2023 showed moderate to severe obstructive sleep apnea.  He had a subsequent titration study on 10/15/2022.  His CPAP set up date was 11/18/2022.  He has a ResMed air sense 11 AutoSet machine.  His DME company is [employer].  Today, 01/29/2023: I reviewed his CPAP compliance data from 12/29/2022 through 01/27/2023, which is a total of 30 days, during which time he used his machine every night with percent use days greater than 4 hours at 100%, indicating superb compliance, average usage of 7 hours.  Residual AHI at goal at 3/h, leak on the low side with the 95th percentile at 0.8 L/min on a pressure of 15 cm with EPR of 2.  He reports still  adjusting to treatment, he is fighting the mask almost every night, he is very restless.  He is willing to continue with treatment, he is not sure that this mask is the right size and style for him.  He notices a leak even though the leak is not displayed as a significant leak on the download.  He has reduced the ramp time as it was not ramping up quickly enough.  He has had some problem with water accumulating in the tubing.  He has fallen recently, feels like his legs want to give out.  He has not had a follow-up with primary care yet, he is taking an over-the-counter vitamin D supplement.  He has not had any major joint swelling recently or joint pain.  He is wondering about the rheumatoid factor but is willing to discuss further with PCP at the next visit.  He has been taking Tylenol PM with some success.  He has not tried melatonin yet.  We talked about his blood test results in detail today.  The patient's allergies, current medications, family history, past medical history, past social history, past surgical history and problem list were reviewed and updated as appropriate.    Previously:   10/22/2022: He reports a longstanding history of sciatica affecting his left leg, he has had more symptoms on the left side in general, he has had numbness affecting both feet and distal legs for the past at least 1 year, symptoms are bilateral, not significant pain but some tingling is reported, mostly numb.  He  has not fallen but has had some mishaps while walking, he has to really look where he places his feet.  He does climb ladders when he is at work.  He has been advised by his primary care and orthopedic doctor not to climb any ladders.  He has no history of diabetes, has been labeled prediabetic at times, he has no strong family history of neuropathy but does report that his younger brother has some form of neuropathy and he is also not diabetic.  He has occasional cramping in the distal lower extremities, left  more than right.  He has no history of alcohol use disorder, no excessive use, no binge drinking, drinks very infrequently in fact.  He tries to hydrate well with water. He just finished a 3-day heart monitor, results are not in yet. We talked about his recent sleep study results today as well.  He is okay to proceed with home CPAP therapy.  He has no preference as to his DME company.   He has been seeing Dr. Jene Every with orthopedics.  He had an EMG and NCV test through [facility] on 08/12/2022 which showed: <<Impression: Abnormal study.   Electrodiagnostic evidence of a chronic lumbosacral radiculopathy, affecting the left L2, L5 and S1 nerve roots. No active denervation or axonal injury noted in the muscles supplied by the respective nerve roots.   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    Absent left sural sensory nerve conduction study. This can be a normal finding in age > 27 yrs old.    No electrodiagnostic evidence of a myopathy in the left lower extremity.    No electrodiagnostic evidence of a common peroneal motor entrapment neuropathy at the level of the left fibular head.    Electrodiagnostic evidence of a right common peroneal motor peripheral neuropathy, with demyelinating and axonal injury noted.    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    Absent right sural sensory nerve conduction study. This can be a normal finding in age > 31 yrs old. >>   He had an MRI of the lumbar spine which reportedly showed moderate to severe facet arthropathy at L5-S1 with a previous decompression surgery on the left.  He received a cortisone injection to the left knee.   He was advised to continue with his B complex supplement.       His baseline sleep study from 07/23/2022 showed moderate  to severe obstructive sleep apnea with a total AHI of 26.5/h, REM AHI 45.3/h, O2 nadir 82%.     He had a CPAP titration study on 10/15/2021, which showed good results with CPAP of 15 cm via fullface mask.  I have placed an order for CPAP therapy for home use.   He had blood work through his primary care on 08/08/2022 and I was able to review the results: Hemoglobin A1c was 6.1, TSH 1.42, BMP showed elevated blood sugar at 198, BUN elevated at 125, creatinine 1.33, CBC without differential unremarkable.  A B12 level was ordered in December 2023 but not drawn.     06/04/22: (He) reports some daytime tiredness, nonrestorative sleep and witnessed apneas.  I reviewed your office note from 04/26/2022.  His Epworth sleepiness score is 7 out of 24, fatigue severity score is 26 out of 63.  He is not sure if he snores, his wife has not  complained about it.  He has rarely had a morning headache, does not wake up fully rested.  He has nocturia every other night, usually only once.  He drinks caffeine in moderation, 1 cup of coffee in the morning, 1 or 2 bottles of green tea, 1 iced tea with dinner.  He drinks soda occasionally, he drinks alcohol rarely, maybe once or twice a month, he is a non-smoker.  He lives with his wife, he has 2 grown children, 1 dog in the household.  He has a TV in the bedroom and typically is on at night, helps him fall asleep he believes.  He has a 30-minute timer on it.  He had a tonsillectomy as a child.  Weight has been more or less stable within 5 pound difference, overall gained about 5 pounds in the last 3 years.  Bedtime is generally between midnight and 12:30 AM but he admits that he often already has slept on the couch for an hour and a half or so.  Rise time is around 7:30 AM.  He does wake up with his wife at 530 or 6 AM but goes back to bed.  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  On a recent golf trip, his friend had mentioned  long apneic pauses to him.  He is not aware of any family history of sleep apnea.      His Past Medical History Is Significant For: Past Medical History:  Diagnosis Date   Hyperlipidemia    Leg pain    OSA (obstructive sleep apnea)    PVC (premature ventricular contraction)    Squamous cell carcinoma of skin 01/16/2017   in situ- right crown     His Past Surgical History Is Significant For: Past Surgical History:  Procedure Laterality Date   LEFT HEART CATH AND CORONARY ANGIOGRAPHY N/A 05/14/2022   Procedure: LEFT HEART CATH AND CORONARY ANGIOGRAPHY;  Surgeon: Elder Negus, MD;  Location: [facility] IN[facility];  Service: Cardiovascular;  Laterality: N/A;   LUMBAR LAMINECTOMY/DECOMPRESSION MICRODISCECTOMY Left 05/18/2020   Procedure: Microlumbar decompression Lumbar Four- Lumbar Five, Lumbar Five- Sacral One left;  Surgeon: Jene Every, MD;  Location: [facility] OR;  Service: Orthopedics;  Laterality: Left;  Microlumbar decompression Lumbar Five- Sacral One left   PVC ABLATION N/A 09/11/2022   Procedure: PVC ABLATION;  Surgeon: Marinus Maw, MD;  Location: [facility];  Service: Cardiovascular;  Laterality: N/A;   ROTATOR CUFF REPAIR Right    thumb surgery Left    TONSILLECTOMY     VASECTOMY      His Family History Is Significant For: Family History  Problem Relation Age of Onset   Hypertension Mother    Hyperlipidemia Father    Neuropathy Brother    Sleep apnea Neg Hx     His Social History Is Significant For: Social History   Socioeconomic History   Marital status: Married    Spouse name: Not on file   Number of children: 2   Years of education: Not on file   Highest education level: Not on file  Occupational History   Not on file  Tobacco Use   Smoking status: Never   Smokeless tobacco: Never  Vaping Use   Vaping Use: Never used  Substance and Sexual Activity   Alcohol use: Yes    Comment: occ   Drug use: No   Sexual activity: Yes  Other Topics  Concern  Not on file  Social History Narrative   Not on file   Social Determinants of Health   Financial Resource Strain: Low Risk  (11/20/2022)   Overall Financial Resource Strain (CARDIA)    Difficulty of Paying Living Expenses: Not hard at all  Food Insecurity: No Food Insecurity (11/20/2022)   Hunger Vital Sign    Worried About Running Out of Food in the Last Year: Never true    Ran Out of Food in the Last Year: Never true  Transportation Needs: No Transportation Needs (11/20/2022)   PRAPARE - Administrator, Civil Service (Medical): No    Lack of Transportation (Non-Medical): No  Physical Activity: Patient Declined (11/20/2022)   Exercise Vital Sign    Days of Exercise per Week: Patient declined    Minutes of Exercise per Session: Patient declined  Stress: No Stress Concern Present (11/20/2022)   [employer] of Occupational Health - Occupational Stress Questionnaire    Feeling of Stress : Not at all  Social Connections: Socially Integrated (11/20/2022)   Social Connection and Isolation Panel [NHANES]    Frequency of Communication with Friends and Family: More than three times a week    Frequency of Social Gatherings with Friends and Family: More than three times a week    Attends Religious Services: More than 4 times per year    Active Member of [employer] or Organizations: Yes    Attends Engineer, structural: More than 4 times per year    Marital Status: Married    His Allergies Are:  Allergies  Allergen Reactions   Bee Venom Anaphylaxis   Penicillins Hives and Swelling  :   His Current Medications Are:  Outpatient Encounter Medications as of 01/29/2023  Medication Sig   acetaminophen (TYLENOL) 500 MG tablet Take 1,000 mg by mouth daily as needed for moderate pain.   Alpha-Lipoic Acid 600 MG CAPS Take 1,200 mg by mouth daily.   amLODipine (NORVASC) 5 MG tablet Take 1 tablet (5 mg total) by mouth daily.   Cholecalciferol (VITAMIN D3) 50 MCG (2000 UT)  capsule Take 2,000 Units by mouth daily.   metoprolol succinate (TOPROL-XL) 50 MG 24 hr tablet Take 1 tablet (50 mg total) by mouth daily. Take with or immediately following a meal.   Misc Natural Products (TURMERIC CURCUMIN) CAPS Take by mouth. 1500 MG ONCE PER DAY   omeprazole (PRILOSEC) 20 MG capsule TAKE 1 CAPSULE BY MOUTH DAILY   OVER THE COUNTER MEDICATION Take 2 capsules by mouth daily. H.A. Joint formula   rosuvastatin (CRESTOR) 20 MG tablet TAKE 1 TABLET BY MOUTH DAILY   benzonatate (TESSALON) 100 MG capsule Take 1 capsule (100 mg total) by mouth 3 (three) times daily as needed for cough.   DM-Doxylamine-Acetaminophen (NYQUIL COLD & FLU PO) Take 1 % by mouth 1 day or 1 dose.   No facility-administered encounter medications on file as of 01/29/2023.  :  Review of Systems:  Out of a complete 14 point review of systems, all are reviewed and negative with the exception of these symptoms as listed below:  Review of Systems  Neurological:        Pt here for CPAP f/u Pt states not sleeping well at night and mask doesn't fit his face Pt  states hose collects water  Pt states fell twice due to neuropathy Pt states did hit head no ED visit     ESS;6    Objective:  Neurological Exam  Physical Exam Physical  Examination:   Vitals:   01/29/23 1316  BP: 121/71  Pulse: (!) 52    General Examination: The patient is a very pleasant 67 y.o. male in no acute distress. He appears well-developed and well-nourished and well groomed.   HEENT: Normocephalic, atraumatic, pupils are equal, round and reactive to light, extraocular tracking is good without limitation to gaze excursion or nystagmus noted.  Corrective eyeglasses in place.  Hearing is grossly intact. Face is symmetric with normal facial animation. Speech is clear with no dysarthria noted. There is no hypophonia. There is no lip, neck/head, jaw or voice tremor. Neck is supple with full range of passive and active motion. There are no  carotid bruits on auscultation. Oropharynx exam reveals: Stable findings, mild mouth dryness noted.  Tongue protrudes centrally and palate elevates symmetrically.     Chest: Clear to auscultation without wheezing, rhonchi or crackles noted.   Heart: S1+S2+0, regular and normal without murmurs, rubs or gallops noted.    Abdomen: Soft, non-tender and non-distended.   Extremities: There is no pitting edema, no discoloration.    Skin: Warm and dry without trophic changes noted.    Musculoskeletal: exam reveals no obvious joint deformities.     Neurologically:  Mental status: The patient is awake, alert and oriented in all 4 spheres. His immediate and remote memory, attention, language skills and fund of knowledge are appropriate. There is no evidence of aphasia, agnosia, apraxia or anomia. Speech is clear with normal prosody and enunciation. Thought process is linear. Mood is normal and affect is normal.  Cranial nerves II - XII are as described above under HEENT exam.  Motor exam: Normal bulk, strength and tone is noted. There is no obvious action or resting tremor.  Fine motor skills and coordination: grossly intact.  Cerebellar testing: No dysmetria or intention tremor. There is no truncal or gait ataxia.  Sensory exam: intact to light touch in the upper and lower extremities.   Gait, station and balance: He stands easily. No veering to one side is noted. No leaning to one side is noted. Posture is slightly stooped in the lower back, no obvious change, does not walk with a walking aid.    Assessment and Plan:  In summary, AJA BOLANDER is a 67 year old right-handed gentleman with an underlying medical history of hyperlipidemia, PVCs, bradycardia, history of paroxysmal atrial tachycardia, atypical chest pain, sciatica, peripheral neuropathy, and mild obesity, who presents for follow-up consultation of his obstructive sleep apnea after interim sleep testing and starting home CPAP therapy. I  also had evaluated the patient on 10/22/22 for concern for neuropathy. He had undergone EMG and nerve conduction velocity testing through [facility] in October 2023, which indicated evidence of neuropathy.  We did extensive blood work on 10/22/2022. His vitamin D was low, vitamin B12 was on the lower end of the spectrum.  Multiple myeloma panel showed a polyclonal increase in IgM.  Rheumatoid factor was elevated.  He is advised to continue with over-the-counter vitamin D and discuss further with PCP at the next visit whether input from hematology or rheumatology would be valuable.  He can wait until his next visit with PCP. His baseline sleep study from October 2023 showed moderate to severe obstructive sleep apnea.  He had a subsequent titration study on 10/15/2022.  His CPAP set up date was 11/18/2022.  He has a ResMed air sense 11 AutoSet machine.  His DME company is [employer].  He is fully compliant with treatment but is struggling with  tolerance of his CPAP.  He has a fullface mask from [employer], size extra small, he may want to try a different brand and style.  He is advised to make an appointment with his DME provider for mask refit.  He is advised to continue with his CPAP, he is highly commended for his treatment adherence.  We talked about the importance of fall prevention and healthy lifestyle.  He is advised to follow-up routinely in this clinic in 1 year, sooner if needed.  He is advised to try melatonin at night for sleep.  I answered all his questions today and he was in agreement. I spent 40 minutes in total face-to-face time and in reviewing records during pre-charting, more than 50% of which was spent in counseling and coordination of care, reviewing test results, reviewing medications and treatment regimen and/or in discussing or reviewing the diagnosis of OSA, PN, the prognosis and treatment options. Pertinent laboratory and imaging test results that were available during this visit with the  patient were reviewed by me and considered in my medical decision making (see chart for details).

## 2023-01-30 NOTE — Progress Notes (Signed)
New, Doristine Mango, RN; Penni Homans; Marveen Reeks; Santina Evans Received, Thank you!     Previous Messages    ----- Message ----- From: Guy Begin, RN Sent: 01/30/2023   7:58 AM EDT To: Henderson Newcomer; Kathyrn Sheriff; Santina Evans; * Subject: mask fit appt needed, explore [phone]            Order in epic  Thanks  Dantre L. Chanler Mendonca" Male, 67 y.o., Jan 29, 1956 MRN: [id number]   [facility] RN

## 2023-02-10 ENCOUNTER — Other Ambulatory Visit: Payer: Self-pay | Admitting: Cardiology

## 2023-02-10 DIAGNOSIS — R0789 Other chest pain: Secondary | ICD-10-CM

## 2023-02-11 DIAGNOSIS — Z0289 Encounter for other administrative examinations: Secondary | ICD-10-CM

## 2023-02-12 ENCOUNTER — Encounter (INDEPENDENT_AMBULATORY_CARE_PROVIDER_SITE_OTHER): Payer: Medicare Other | Admitting: Family Medicine

## 2023-02-13 ENCOUNTER — Encounter: Payer: Self-pay | Admitting: Family Medicine

## 2023-02-13 ENCOUNTER — Ambulatory Visit (INDEPENDENT_AMBULATORY_CARE_PROVIDER_SITE_OTHER): Payer: Medicare Other | Admitting: Family Medicine

## 2023-02-13 VITALS — BP 110/78 | HR 60 | Temp 98.8°F | Ht 71.0 in | Wt 231.4 lb

## 2023-02-13 DIAGNOSIS — M21372 Foot drop, left foot: Secondary | ICD-10-CM

## 2023-02-13 DIAGNOSIS — R778 Other specified abnormalities of plasma proteins: Secondary | ICD-10-CM

## 2023-02-13 DIAGNOSIS — E559 Vitamin D deficiency, unspecified: Secondary | ICD-10-CM

## 2023-02-13 DIAGNOSIS — R7303 Prediabetes: Secondary | ICD-10-CM

## 2023-02-13 DIAGNOSIS — E782 Mixed hyperlipidemia: Secondary | ICD-10-CM

## 2023-02-13 DIAGNOSIS — I493 Ventricular premature depolarization: Secondary | ICD-10-CM | POA: Diagnosis not present

## 2023-02-13 DIAGNOSIS — R768 Other specified abnormal immunological findings in serum: Secondary | ICD-10-CM

## 2023-02-13 DIAGNOSIS — G6289 Other specified polyneuropathies: Secondary | ICD-10-CM

## 2023-02-13 DIAGNOSIS — K219 Gastro-esophageal reflux disease without esophagitis: Secondary | ICD-10-CM | POA: Diagnosis not present

## 2023-02-13 LAB — COMPREHENSIVE METABOLIC PANEL
ALT: 21 U/L (ref 0–53)
AST: 23 U/L (ref 0–37)
Albumin: 4.1 g/dL (ref 3.5–5.2)
Alkaline Phosphatase: 62 U/L (ref 39–117)
BUN: 15 mg/dL (ref 6–23)
CO2: 31 mEq/L (ref 19–32)
Calcium: 9.3 mg/dL (ref 8.4–10.5)
Chloride: 99 mEq/L (ref 96–112)
Creatinine, Ser: 1.17 mg/dL (ref 0.40–1.50)
GFR: 65.06 mL/min (ref >60.00)
Glucose, Bld: 112 mg/dL — ABNORMAL HIGH (ref 70–99)
Potassium: 4.3 mEq/L (ref 3.5–5.1)
Sodium: 138 mEq/L (ref 135–145)
Total Bilirubin: 1.2 mg/dL (ref 0.2–1.2)
Total Protein: 7.2 g/dL (ref 6.0–8.3)

## 2023-02-13 LAB — LIPID PANEL
Cholesterol: 119 mg/dL (ref 0–200)
HDL: 39.8 mg/dL (ref >39.00)
LDL Cholesterol: 48 mg/dL (ref 0–99)
NonHDL: 79.15
Total CHOL/HDL Ratio: 3
Triglycerides: 157 mg/dL — ABNORMAL HIGH (ref 0.0–149.0)
VLDL: 31.4 mg/dL (ref 0.0–40.0)

## 2023-02-13 LAB — HEMOGLOBIN A1C: Hgb A1c MFr Bld: 6 % (ref 4.6–6.5)

## 2023-02-13 LAB — VITAMIN D 25 HYDROXY (VIT D DEFICIENCY, FRACTURES): VITD: 33.57 ng/mL (ref 30.00–100.00)

## 2023-02-13 NOTE — Progress Notes (Signed)
Subjective:  Patient ID: Thomas Harding, male    DOB: 1956-08-24  Age: 67 y.o. MRN: [id number]  CC:  Chief Complaint  Patient presents with   Hyperlipidemia    Pt wants to discuss concerns with neurologist  Wants to discuss rheumatoid factor being high     HPI Thomas Harding presents for   Foot neuropathy  last discussed in December.  History of lumbar radiculopathy, spinal stenosis, status post lumbar laminae to me decompression microdiscectomy in 2021 but had ongoing foot dysesthesias for years, some foot drop had been noted and some tripping, falls in the preceding few months.  Difficulty with dorsiflexion of left foot.  Had reported nerve conduction studies through Ortho indicating severe neuropathy in both feet, but not thought to be a back issue.  Had not tolerated gabapentin,  300 mg, option of low-dose or Lyrica.  Evaluated by neurology January 9.  Left greater than right neuropathy.  Primarily lumbar radiculopathy with superimposed or independent peripheral neuropathy.  Various blood tests obtained.  Per telephone notes, Thomas Harding reviewed results from [facility] and did not feel repeat testing is needed at that time.  Elevated rheumatoid factor, but other inflammatory markers were normal(sed rate 7, negative ANA, normal C-reactive protein, normal heavy metal screen, normal CK, and HIV/RPR reactive).  Serum protein electrophoresis with multiple myeloma panel indicated IgM serum spike at 328, polyclonal increase detected of IFE 1.  B vitamins, folate normal.  B12 reportedly on lower end of spectrum, on B complex multivitamin.  Option of follow-up with rheumatology, hematology regarding these results, planned PCP follow-up first.  Follow-up visit noted April 17 with Dr. Frances Harding. No changes - follow up with me as planned above.  Still with foot drop, about the same. Some difficulty adjusting to uneven surfaces, no injuries with falls. Less numbness feeling after using topical massager  to leg. No pain, just numbness. Rare sharp pain in toes, few seconds.   GERD Discussed in December.  Frequent use of over-the-counter Rolaids at that time.  Triggers discussed,Started omeprazole 20 mg daily. Doing well on omeprazole daily. No breakthrough sx's.   Vitamin D deficiency Level was 14.9 on January 9, labs with neurology.  Initial recommendation of 2000 units/day supplement - taking daily.   Hypertension: With history of PVCs, treated with Toprol 50 mg daily.  Has been evaluated by cardiology for symptomatic PVCs previously. 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 cardiology eval Dr. Ladona Ridgel on 1/30. Occasional pvcs, stable. No plan on changes. 12% - lower than prior.   Home readings: 128/78, rare elevations. No new med side effects.  BP Readings from Last 3 Encounters:  02/13/23 110/78  01/29/23 121/71  11/12/22 110/68   Lab Results  Component Value Date   CREATININE 1.03 10/22/2022   Prediabetes: Weight down 4 pounds from previous reading, 230 in October last year. Working with healthy weight and wellness - initial visit May 30th. S/p info session.  Lab Results  Component Value Date   HGBA1C 6.1 (H) 10/22/2022   Wt Readings from Last 3 Encounters:  02/13/23 231 lb 6.4 oz (105 kg)  01/29/23 235 lb 6.4 oz (106.8 kg)  11/20/22 225 lb (102.1 kg)   Hyperlipidemia: Crestor 20 mg daily, no new myalgias/side effects.  Lab Results  Component Value Date   CHOL 106 05/09/2022   HDL 40 05/09/2022   LDLCALC 46 05/09/2022   LDLDIRECT 95.0 02/25/2022   TRIG 111 05/09/2022   CHOLHDL 2.7 05/09/2022  Lab Results  Component Value Date   ALT 29 10/22/2022   AST 28 10/22/2022   ALKPHOS 103 10/22/2022   BILITOT 1.0 10/22/2022    History Patient Active Problem List   Diagnosis Date Noted   Gastroesophageal reflux disease 10/01/2022   Personal history of colonic polyps 10/01/2022   PVC's (premature ventricular contractions) 09/12/2022    Atypical chest pain 04/26/2022   Apnea spell 04/26/2022   Fatigue 04/26/2022   Morbid obesity (HCC) 04/26/2022   Obesity 04/26/2022   Elevated coronary artery calcium score 04/25/2022   Abnormal stress test 04/25/2022   Claudication (HCC) 01/31/2022   Paroxysmal atrial tachycardia 08/02/2021   Precordial pain 06/07/2021   Bradycardia 06/22/2020   PVC (premature ventricular contraction) 06/22/2020   Spinal stenosis of lumbar region 04/20/2020   Dyspnea 11/22/2016    Past Medical History:  Diagnosis Date   Hyperlipidemia    Leg pain    OSA (obstructive sleep apnea)    PVC (premature ventricular contraction)    Squamous cell carcinoma of skin 01/16/2017   in situ- right crown       Review of Systems   Objective:   Vitals:   02/13/23 0806  Weight: 231 lb 6.4 oz (105 kg)  Height: 5\' 11"  (1.803 m)     Physical Exam Vitals reviewed.  Constitutional:      Appearance: He is well-developed.  HENT:     Head: Normocephalic and atraumatic.  Neck:     Vascular: No carotid bruit or JVD.  Cardiovascular:     Rate and Rhythm: Normal rate and regular rhythm.     Heart sounds: Normal heart sounds. No murmur heard. Pulmonary:     Effort: Pulmonary effort is normal.     Breath sounds: Normal breath sounds. No rales.  Musculoskeletal:     Right lower leg: No edema.     Left lower leg: No edema.  Skin:    General: Skin is warm and dry.  Neurological:     Mental Status: He is alert and oriented to person, place, and time.  Psychiatric:        Mood and Affect: Mood normal.        Assessment & Plan:  Thomas Harding is a 67 y.o. male . Mixed hyperlipidemia Assessment & Plan: Tolerating Crestor continue same.  Check updated labs.  Orders: -     Comprehensive metabolic panel -     Lipid panel  Prediabetes Assessment & Plan: Now followed by healthy weight and wellness.  Anticipate improvement with improved weight.  Orders: -     Hemoglobin A1c  Rheumatoid  factor positive -     Ambulatory referral to Rheumatology  Abnormal SPEP -     Ambulatory referral to Hematology / Oncology  Left foot drop Assessment & Plan: With neuropathy, eval by neurology as above.  Appears to have possible combined peripheral neuropathy and lumbar radiculopathy.  Intolerant to gabapentin previously.  Option of other neurology eval.  Will refer to rheumatology for elevated rheumatoid factor, unsure of significance given other testing reassuring.  Will also be referring to hematology regarding elevated IgM on SPEP testing.  Option of Lyrica or low-dose gabapentin if more symptomatic.  Fall precautions.   Other polyneuropathy Assessment & Plan: As above, depending on amount of symptoms options of Lyrica or lower dose gabapentin.  Deferred for now.   PVC (premature ventricular contraction) Assessment & Plan: Infrequent symptoms, follow-up with cardiology as scheduled, continue same med regimen for now.  Vitamin D deficiency Assessment & Plan: On over-the-counter supplement, check updated labs.  Consider prescription dose if persistent low on over-the-counter treatment.  Orders: -     VITAMIN D 25 Hydroxy (Vit-D Deficiency, Fractures)  Gastroesophageal reflux disease, unspecified whether esophagitis present Assessment & Plan: Improved, stable symptoms with PPI, continue same.     Patient Instructions  No change in meds at this time.  I will check the labs we discussed including the vitamin D.  Continue supplement for now.  I referred you to hematology to discuss the abnormal results from testing at neurology as well as rheumatology to discuss the rheumatoid factor.  Again this may not be specific.  Depending on results and plan from the specialists, I am happy to refer you back to neurology or separate neurologist if needed to discuss the neuropathy symptoms.  Let me know if there are questions.  Thanks for coming in today.    Signed,   Meredith Staggers,  MD [facility], [facility] 02/13/23 8:10 AM

## 2023-02-13 NOTE — Patient Instructions (Signed)
No change in meds at this time.  I will check the labs we discussed including the vitamin D.  Continue supplement for now.  I referred you to hematology to discuss the abnormal results from testing at neurology as well as rheumatology to discuss the rheumatoid factor.  Again this may not be specific.  Depending on results and plan from the specialists, I am happy to refer you back to neurology or separate neurologist if needed to discuss the neuropathy symptoms.  Let me know if there are questions.  Thanks for coming in today.

## 2023-02-16 ENCOUNTER — Encounter: Payer: Self-pay | Admitting: Family Medicine

## 2023-02-16 DIAGNOSIS — G4733 Obstructive sleep apnea (adult) (pediatric): Secondary | ICD-10-CM | POA: Diagnosis not present

## 2023-02-16 DIAGNOSIS — E559 Vitamin D deficiency, unspecified: Secondary | ICD-10-CM | POA: Insufficient documentation

## 2023-02-16 DIAGNOSIS — R7303 Prediabetes: Secondary | ICD-10-CM | POA: Insufficient documentation

## 2023-02-16 DIAGNOSIS — G629 Polyneuropathy, unspecified: Secondary | ICD-10-CM | POA: Insufficient documentation

## 2023-02-16 DIAGNOSIS — E782 Mixed hyperlipidemia: Secondary | ICD-10-CM | POA: Insufficient documentation

## 2023-02-16 DIAGNOSIS — M21372 Foot drop, left foot: Secondary | ICD-10-CM | POA: Insufficient documentation

## 2023-02-16 NOTE — Assessment & Plan Note (Signed)
With neuropathy, eval by neurology as above.  Appears to have possible combined peripheral neuropathy and lumbar radiculopathy.  Intolerant to gabapentin previously.  Option of other neurology eval.  Will refer to rheumatology for elevated rheumatoid factor, unsure of significance given other testing reassuring.  Will also be referring to hematology regarding elevated IgM on SPEP testing.  Option of Lyrica or low-dose gabapentin if more symptomatic.  Fall precautions.

## 2023-02-16 NOTE — Assessment & Plan Note (Signed)
Tolerating Crestor continue same.  Check updated labs.

## 2023-02-16 NOTE — Assessment & Plan Note (Signed)
Now followed by healthy weight and wellness.  Anticipate improvement with improved weight.

## 2023-02-16 NOTE — Assessment & Plan Note (Signed)
As above, depending on amount of symptoms options of Lyrica or lower dose gabapentin.  Deferred for now.

## 2023-02-16 NOTE — Assessment & Plan Note (Signed)
Improved, stable symptoms with PPI, continue same.

## 2023-02-16 NOTE — Assessment & Plan Note (Signed)
Infrequent symptoms, follow-up with cardiology as scheduled, continue same med regimen for now.

## 2023-02-16 NOTE — Assessment & Plan Note (Signed)
On over-the-counter supplement, check updated labs.  Consider prescription dose if persistent low on over-the-counter treatment.

## 2023-02-17 DIAGNOSIS — G4733 Obstructive sleep apnea (adult) (pediatric): Secondary | ICD-10-CM | POA: Diagnosis not present

## 2023-02-27 DIAGNOSIS — H5203 Hypermetropia, bilateral: Secondary | ICD-10-CM | POA: Diagnosis not present

## 2023-02-27 DIAGNOSIS — H2513 Age-related nuclear cataract, bilateral: Secondary | ICD-10-CM | POA: Diagnosis not present

## 2023-02-27 DIAGNOSIS — H52203 Unspecified astigmatism, bilateral: Secondary | ICD-10-CM | POA: Diagnosis not present

## 2023-02-27 DIAGNOSIS — H524 Presbyopia: Secondary | ICD-10-CM | POA: Diagnosis not present

## 2023-03-13 ENCOUNTER — Encounter (INDEPENDENT_AMBULATORY_CARE_PROVIDER_SITE_OTHER): Payer: Self-pay | Admitting: Family Medicine

## 2023-03-13 ENCOUNTER — Ambulatory Visit (INDEPENDENT_AMBULATORY_CARE_PROVIDER_SITE_OTHER): Payer: Medicare Other | Admitting: Family Medicine

## 2023-03-13 VITALS — BP 123/75 | HR 66 | Temp 98.2°F | Ht 71.0 in | Wt 228.0 lb

## 2023-03-13 DIAGNOSIS — R5383 Other fatigue: Secondary | ICD-10-CM

## 2023-03-13 DIAGNOSIS — R0602 Shortness of breath: Secondary | ICD-10-CM

## 2023-03-13 DIAGNOSIS — R739 Hyperglycemia, unspecified: Secondary | ICD-10-CM | POA: Diagnosis not present

## 2023-03-13 DIAGNOSIS — Z1331 Encounter for screening for depression: Secondary | ICD-10-CM

## 2023-03-13 DIAGNOSIS — E559 Vitamin D deficiency, unspecified: Secondary | ICD-10-CM

## 2023-03-13 DIAGNOSIS — G4733 Obstructive sleep apnea (adult) (pediatric): Secondary | ICD-10-CM

## 2023-03-13 DIAGNOSIS — E669 Obesity, unspecified: Secondary | ICD-10-CM

## 2023-03-13 DIAGNOSIS — E782 Mixed hyperlipidemia: Secondary | ICD-10-CM

## 2023-03-13 DIAGNOSIS — Z6831 Body mass index (BMI) 31.0-31.9, adult: Secondary | ICD-10-CM

## 2023-03-13 NOTE — Progress Notes (Signed)
Chief Complaint:   OBESITY Thomas Harding (MR# [id number]) is a 67 y.o. male who presents for evaluation and treatment of obesity and related comorbidities. Current BMI is Body mass index is 31.8 kg/m. Thomas Harding has been struggling with his weight for many years and has been unsuccessful in either losing weight, maintaining weight loss, or reaching his healthy weight goal.  Referred by Dr. Chilton Si. Works as a Product/process development scientist, Control and instrumentation engineer.  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 Married, lives at home with wife April, who is supportive of him and will eat healthy regardless of what patient eats. Desired weight is 210-215lbs.  Previously used Phen Phen and alli.  Felt Phen Phen boosted his metabolism.  Eats fast food or take out 3 times a week- [employer], [employer] (baked ziti), boneless wings.  Does not eat eggs, any bell peppers, onions.  Skips lunch often due to time.  Food Recall: Eats Greek Yogurt and a cup and half of blueberry, strawberries or blackberries. Feels satisfied.  Around 10am handful of peanut butter filled pretzels.  Around 12pm [employer] bar.  Then at 2/2:30 another energy bar.  Typical dinner is salmon, 2 spoons of butter beans, brown rice or grilled chicken 8-10oz, fried brussel sprouts and green beans.  After dinner is popcorn, peanut butter crackers, healthy choice fudgsicle.  Thomas Harding is currently in the action stage of change and ready to dedicate time achieving and maintaining a healthier weight. Thomas Harding is interested in becoming our patient and working on intensive lifestyle modifications including (but not limited to) diet and exercise for weight loss.  [name] habits were reviewed today and are as follows: His family eats meals together, he thinks his family will eat healthier with him, his desired weight loss is 18 lbs , he has been heavy most of his life, he started gaining weight during  Covid, his heaviest weight ever was 235 pounds, he is a picky eater and doesn't like to eat healthier foods, he has significant food cravings issues, he snacks frequently in the evenings, he skips meals frequently, he is frequently drinking liquids with calories, he frequently eats larger portions than normal, and he struggles with emotional eating.  Depression Screen Thomas Harding's Food and Mood (modified PHQ-9) score was 2.  Subjective:   1. Other fatigue [name] denies daytime somnolence and denies waking up still tired. Patient has a history of symptoms of N/A. Thomas Harding generally gets 7 or 8 hours of sleep per night, and states that he has generally restful sleep. Snoring is not present. (Since CPAP) Apneic episodes are not present.(Since CPAP) Epworth Sleepiness Score is 3. EKG, NSR with IPVC. (11/12/2022).  2. SOBOE (shortness of breath on exertion) Thomas Harding notes increasing shortness of breath with exercising and seems to be worsening over time with weight gain. He notes getting out of breath sooner with activity than he used to. This has not gotten worse recently. [name] denies shortness of breath at rest or orthopnea.  3. Hyperglycemia Patient apparently elevated A1c for about 2 years.  4. Vitamin D deficiency Patient is on OTC vitamin D 1000 IU daily.  Patient is positive for fatigue.  5. Mixed hyperlipidemia Patient is on rosuvastatin 20 mg daily.  FLP, WNL in terms of LDL, HDL decreased triglycerides elevated.  6. OSA (obstructive sleep apnea) Patient was diagnosed a few years ago.  Patient has a CPAP x 4 months.  Assessment/Plan:  1. Other fatigue Thomas Harding does feel that his weight is causing his energy to be lower than it should be. Fatigue may be related to obesity, depression or many other causes. Labs will be ordered, and in the meanwhile, [name] will focus on self care including making healthy food choices, increasing physical activity and focusing on stress reduction. Check  IC and labs today.   - CBC with Differential/Platelet - Insulin, random - T3 - T4, free - TSH  2. SOBOE (shortness of breath on exertion) Thomas Harding does feel that he gets out of breath more easily that he used to when he exercises. Thomas Harding's shortness of breath appears to be obesity related and exercise induced. He has agreed to work on weight loss and gradually increase exercise to treat his exercise induced shortness of breath. Will continue to monitor closely.  - CBC with Differential/Platelet  3. Hyperglycemia Check labs today.  - Insulin, random  4. Vitamin D deficiency Follow-up at next appointment on continued dosage.  5. Mixed hyperlipidemia Repeat labs in 3 months.  6. OSA (obstructive sleep apnea) Follow-up on compliance at next appointment.  7. Depression screening Thomas Harding had a negative depression screening.   8. Class 1 obesity with serious comorbidity and body mass index (BMI) of 32.0 to 32.9 in adult, unspecified obesity type [name] is currently in the action stage of change and his goal is to continue with weight loss efforts. I recommend Thomas Harding begin the structured treatment plan as follows:  He has agreed to the Category 3 Plan.  Exercise goals: No exercise has been prescribed at this time.   Behavioral modification strategies: increasing lean protein intake, meal planning and cooking strategies, keeping healthy foods in the home, and planning for success.  He was informed of the importance of frequent follow-up visits to maximize his success with intensive lifestyle modifications for his multiple health conditions. He was informed we would discuss his lab results at his next visit unless there is a critical issue that needs to be addressed sooner. Thomas Harding agreed to keep his next visit at the agreed upon time to discuss these results.  Objective:   Blood pressure 123/75, pulse 66, temperature 98.2 F (36.8 C), height 5\' 11"  (1.803 m), weight 228 lb (103.4  kg), SpO2 99 %. Body mass index is 31.8 kg/m.  EKG: Normal sinus rhythm, rate 63 bpm.  Indirect Calorimeter completed today shows a VO2 of 261 and a REE of 1800.  His calculated basal metabolic rate is 4098 thus his basal metabolic rate is worse than expected.  General: Cooperative, alert, well developed, in no acute distress. HEENT: Conjunctivae and lids unremarkable. Cardiovascular: Regular rhythm.  Lungs: Normal work of breathing. Neurologic: No focal deficits.   Lab Results  Component Value Date   CREATININE 1.14 03/14/2023   BUN 20 03/14/2023   NA 139 03/14/2023   K 4.2 03/14/2023   CL 102 03/14/2023   CO2 30 03/14/2023   Lab Results  Component Value Date   ALT 20 03/14/2023   AST 24 03/14/2023   ALKPHOS 64 03/14/2023   BILITOT 1.5 (H) 03/14/2023   Lab Results  Component Value Date   HGBA1C 6.0 02/13/2023   HGBA1C 6.1 (H) 10/22/2022   HGBA1C 6.1 08/08/2022   HGBA1C 5.5 07/21/2020   Lab Results  Component Value Date   INSULIN 12.4 03/13/2023   Lab Results  Component Value Date   TSH 1.790 03/13/2023   Lab Results  Component Value Date   CHOL 119 02/13/2023  HDL 39.80 02/13/2023   LDLCALC 48 02/13/2023   LDLDIRECT 95.0 02/25/2022   TRIG 157.0 (H) 02/13/2023   CHOLHDL 3 02/13/2023   Lab Results  Component Value Date   WBC 6.9 03/14/2023   HGB 14.0 03/14/2023   HCT 41.3 03/14/2023   MCV 85.9 03/14/2023   PLT 218 03/14/2023   No results found for: "IRON", "TIBC", "FERRITIN"  Attestation Statements:   Reviewed by clinician on day of visit: allergies, medications, problem list, medical history, surgical history, family history, social history, and previous encounter notes.  Time spent on visit including pre-visit chart review and post-visit charting and care was 48 minutes.   I, Thomas Harding, RMA, Thomas acting as transcriptionist for Reuben Likes, MD.  This is the patient's first visit at [facility]. The patient's NEW PATIENT  PACKET was reviewed at length. Included in the packet: current and past health history, medications, allergies, ROS, gynecologic history (women only), surgical history, family history, social history, weight history, weight loss surgery history (for those that have had weight loss surgery), nutritional evaluation, mood and food questionnaire, PHQ9, Epworth questionnaire, sleep habits questionnaire, patient life and health improvement goals questionnaire. These will all be scanned into the patient's chart under media.   During the visit, I independently reviewed the patient's EKG, bioimpedance scale results, and indirect calorimeter results. I used this information to tailor a meal plan for the patient that will help him to lose weight and will improve his obesity-related conditions going forward. I performed a medically necessary appropriate examination and/or evaluation. I discussed the assessment and treatment plan with the patient. The patient was provided an opportunity to ask questions and all were answered. The patient agreed with the plan and demonstrated an understanding of the instructions. Labs were ordered at this visit and will be reviewed at the next visit unless more critical results need to be addressed immediately. Clinical information was updated and documented in the EMR.   I have reviewed the above documentation for accuracy and completeness, and I agree with the above. - Reuben Likes, MD

## 2023-03-14 ENCOUNTER — Inpatient Hospital Stay: Payer: Medicare Other | Attending: Physician Assistant | Admitting: Physician Assistant

## 2023-03-14 ENCOUNTER — Inpatient Hospital Stay: Payer: Medicare Other

## 2023-03-14 ENCOUNTER — Encounter: Payer: Self-pay | Admitting: Physician Assistant

## 2023-03-14 VITALS — BP 123/73 | HR 56 | Temp 98.1°F | Resp 18 | Ht 71.0 in | Wt 234.0 lb

## 2023-03-14 DIAGNOSIS — D890 Polyclonal hypergammaglobulinemia: Secondary | ICD-10-CM | POA: Insufficient documentation

## 2023-03-14 DIAGNOSIS — Z79899 Other long term (current) drug therapy: Secondary | ICD-10-CM | POA: Insufficient documentation

## 2023-03-14 DIAGNOSIS — G6289 Other specified polyneuropathies: Secondary | ICD-10-CM | POA: Insufficient documentation

## 2023-03-14 LAB — CBC WITH DIFFERENTIAL/PLATELET
Basophils Absolute: 0.1 10*3/uL (ref 0.0–0.2)
Basos: 1 %
EOS (ABSOLUTE): 0.4 10*3/uL (ref 0.0–0.4)
Eos: 5 %
Hematocrit: 42.8 % (ref 37.5–51.0)
Hemoglobin: 14.4 g/dL (ref 13.0–17.7)
Immature Grans (Abs): 0 10*3/uL (ref 0.0–0.1)
Immature Granulocytes: 0 %
Lymphocytes Absolute: 1.2 10*3/uL (ref 0.7–3.1)
Lymphs: 15 %
MCH: 29 pg (ref 26.6–33.0)
MCHC: 33.6 g/dL (ref 31.5–35.7)
MCV: 86 fL (ref 79–97)
Monocytes Absolute: 0.6 10*3/uL (ref 0.1–0.9)
Monocytes: 7 %
Neutrophils Absolute: 5.7 10*3/uL (ref 1.4–7.0)
Neutrophils: 72 %
Platelets: 208 10*3/uL (ref 150–450)
RBC: 4.96 x10E6/uL (ref 4.14–5.80)
RDW: 11.8 % (ref 11.6–15.4)
WBC: 7.9 10*3/uL (ref 3.4–10.8)

## 2023-03-14 LAB — CMP (CANCER CENTER ONLY)
ALT: 20 U/L (ref 0–44)
AST: 24 U/L (ref 15–41)
Albumin: 4.3 g/dL (ref 3.5–5.0)
Alkaline Phosphatase: 64 U/L (ref 38–126)
Anion gap: 7 (ref 5–15)
BUN: 20 mg/dL (ref 8–23)
CO2: 30 mmol/L (ref 22–32)
Calcium: 9.7 mg/dL (ref 8.9–10.3)
Chloride: 102 mmol/L (ref 98–111)
Creatinine: 1.14 mg/dL (ref 0.61–1.24)
GFR, Estimated: >60 mL/min (ref >60)
Glucose, Bld: 104 mg/dL — ABNORMAL HIGH (ref 70–99)
Potassium: 4.2 mmol/L (ref 3.5–5.1)
Sodium: 139 mmol/L (ref 135–145)
Total Bilirubin: 1.5 mg/dL — ABNORMAL HIGH (ref 0.3–1.2)
Total Protein: 7.4 g/dL (ref 6.5–8.1)

## 2023-03-14 LAB — CBC WITH DIFFERENTIAL (CANCER CENTER ONLY)
Abs Immature Granulocytes: 0.01 10*3/uL (ref 0.00–0.07)
Basophils Absolute: 0.1 10*3/uL (ref 0.0–0.1)
Basophils Relative: 1 %
Eosinophils Absolute: 0.3 10*3/uL (ref 0.0–0.5)
Eosinophils Relative: 5 %
HCT: 41.3 % (ref 39.0–52.0)
Hemoglobin: 14 g/dL (ref 13.0–17.0)
Immature Granulocytes: 0 %
Lymphocytes Relative: 18 %
Lymphs Abs: 1.2 10*3/uL (ref 0.7–4.0)
MCH: 29.1 pg (ref 26.0–34.0)
MCHC: 33.9 g/dL (ref 30.0–36.0)
MCV: 85.9 fL (ref 80.0–100.0)
Monocytes Absolute: 0.5 10*3/uL (ref 0.1–1.0)
Monocytes Relative: 8 %
Neutro Abs: 4.7 10*3/uL (ref 1.7–7.7)
Neutrophils Relative %: 68 %
Platelet Count: 218 10*3/uL (ref 150–400)
RBC: 4.81 MIL/uL (ref 4.22–5.81)
RDW: 11.9 % (ref 11.5–15.5)
WBC Count: 6.9 10*3/uL (ref 4.0–10.5)
nRBC: 0 % (ref 0.0–0.2)

## 2023-03-14 LAB — TSH: TSH: 1.79 u[IU]/mL (ref 0.450–4.500)

## 2023-03-14 LAB — VITAMIN B12: Vitamin B-12: 277 pg/mL (ref 180–914)

## 2023-03-14 LAB — INSULIN, RANDOM: INSULIN: 12.4 u[IU]/mL (ref 2.6–24.9)

## 2023-03-14 LAB — T4, FREE: Free T4: 1.06 ng/dL (ref 0.82–1.77)

## 2023-03-14 LAB — T3: T3, Total: 144 ng/dL (ref 71–180)

## 2023-03-14 NOTE — Progress Notes (Unsigned)
[facility] [phone]   [phone]  INITIAL CONSULT NOTE  Patient Care Team: Shade Flood, MD as PCP - General (Family Medicine) Marinus Maw, MD as PCP - Electrophysiology (Cardiology) Jethro Bolus, MD as Consulting Physician (Ophthalmology) Marinus Maw, MD as Consulting Physician (Cardiology)  Hematological/Oncological History 10/22/2022: Underwent workup by neurology for polyneuropathy. SPEP detected elevated IgM levels without a monoclonal protein. IFE detected polyclonal increase.  03/14/2023: Establish care with [facility]emat[facility]  CHIEF COMPLAINTS/PURPOSE OF CONSULTATION:  "Polyclonal gammopathy "  HISTORY OF PRESENTING ILLNESS:  Thomas Harding 67 y.o. 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 He is unaccompanied for this visit.   On exam today, Thomas Harding reports that he is overall feeling without any changes to his energy or appetite. He reports ongoing neuropathy in his feet that does affect his balance. He fell several times with most recent episode last month. 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 He denies any GI symptoms such as nausea, vomiting, diarrhea or constipation. He denies easy bruising or signs of active bleeding. He denies any new or worsening bone/back pain. He has no other complaints. He denies fevers, chills, sweats, shortness of breath, chest pain or cough. He has no other complaints. Rest of the ROS is below.   MEDICAL HISTORY:  Past Medical History:  Diagnosis Date   High cholesterol    Hyperlipidemia    Hypertension    Joint pain    Leg pain    Neuropathy    OSA (obstructive sleep apnea)    PVC's (premature ventricular contractions)    Sleep apnea    SOB (shortness of breath)     Squamous cell carcinoma of skin 01/16/2017   in situ- right crown    Vitamin D deficiency     SURGICAL HISTORY: Past Surgical History:  Procedure Laterality Date   LEFT HEART CATH AND CORONARY ANGIOGRAPHY N/A 05/14/2022   Procedure: LEFT HEART CATH AND CORONARY ANGIOGRAPHY;  Surgeon: Elder Negus, MD;  Location: [facility] IN[facility];  Service: Cardiovascular;  Laterality: N/A;   LUMBAR LAMINECTOMY/DECOMPRESSION MICRODISCECTOMY Left 05/18/2020   Procedure: Microlumbar decompression Lumbar Four- Lumbar Five, Lumbar Five- Sacral One left;  Surgeon: Jene Every, MD;  Location: [facility] OR;  Service: Orthopedics;  Laterality: Left;  Microlumbar decompression Lumbar Five- Sacral One left   PVC ABLATION N/A 09/11/2022   Procedure: PVC ABLATION;  Surgeon: Marinus Maw, MD;  Location: [facility];  Service: Cardiovascular;  Laterality: N/A;   ROTATOR CUFF REPAIR Right    thumb surgery Left    TONSILLECTOMY     VASECTOMY      SOCIAL HISTORY: Social History   Socioeconomic History   Marital status: Married    Spouse name: Not on file   Number of children: 2   Years of education: Not on file   Highest education level: Not on file  Occupational History   Occupation: Product/process development scientist   Occupation: Research scientist (medical)  Tobacco Use   Smoking status: Never   Smokeless tobacco: Never  Vaping Use   Vaping Use: Never used  Substance and Sexual Activity   Alcohol use: Yes    Comment: occ   Drug use: No   Sexual activity: Yes  Other Topics Concern   Not on file  Social History Narrative  Not on file   Social Determinants of Health   Financial Resource Strain: Low Risk  (11/20/2022)   Overall Financial Resource Strain (CARDIA)    Difficulty of Paying Living Expenses: Not hard at all  Food Insecurity: No Food Insecurity (03/14/2023)   Hunger Vital Sign    Worried About Running Out of Food in the Last Year: Never true    Ran Out of Food in the Last Year: Never true   Transportation Needs: No Transportation Needs (03/14/2023)   PRAPARE - Administrator, Civil Service (Medical): No    Lack of Transportation (Non-Medical): No  Physical Activity: Patient Declined (11/20/2022)   Exercise Vital Sign    Days of Exercise per Week: Patient declined    Minutes of Exercise per Session: Patient declined  Stress: No Stress Concern Present (11/20/2022)   [employer] of Occupational Health - Occupational Stress Questionnaire    Feeling of Stress : Not at all  Social Connections: Socially Integrated (11/20/2022)   Social Connection and Isolation Panel [NHANES]    Frequency of Communication with Friends and Family: More than three times a week    Frequency of Social Gatherings with Friends and Family: More than three times a week    Attends Religious Services: More than 4 times per year    Active Member of [employer] or Organizations: Yes    Attends Engineer, structural: More than 4 times per year    Marital Status: Married  Catering manager Violence: Not At Risk (03/14/2023)   Humiliation, Afraid, Rape, and Kick questionnaire    Fear of Current or Ex-Partner: No    Emotionally Abused: No    Physically Abused: No    Sexually Abused: No    FAMILY HISTORY: Family History  Problem Relation Age of Onset   Hypertension Mother    Hyperlipidemia Father    Neuropathy Brother    Sleep apnea Neg Hx     ALLERGIES:  is allergic to bee venom and penicillins.  MEDICATIONS:  Current Outpatient Medications  Medication Sig Dispense Refill   acetaminophen (TYLENOL) 500 MG tablet Take 1,000 mg by mouth daily as needed for moderate pain. (Patient not taking: Reported on 03/13/2023)     Alpha-Lipoic Acid 600 MG CAPS Take 1,200 mg by mouth daily.     amLODipine (NORVASC) 5 MG tablet Take 1 tablet (5 mg total) by mouth daily. 90 tablet 3   benzonatate (TESSALON) 100 MG capsule Take 1 capsule (100 mg total) by mouth 3 (three) times daily as needed for cough. 20  capsule 0   Cholecalciferol (VITAMIN D3 PO) Take 4,000 Units by mouth daily.     DM-Doxylamine-Acetaminophen (NYQUIL COLD & FLU PO) Take 1 % by mouth 1 day or 1 dose. (Patient not taking: Reported on 03/13/2023)     metoprolol succinate (TOPROL-XL) 50 MG 24 hr tablet Take 1 tablet (50 mg total) by mouth daily. Take with or immediately following a meal. 90 tablet 3   Misc Natural Products (TURMERIC CURCUMIN) CAPS Take by mouth. 1500 MG ONCE PER DAY     omeprazole (PRILOSEC) 20 MG capsule TAKE 1 CAPSULE BY MOUTH DAILY 90 capsule 1   OVER THE COUNTER MEDICATION Take 2 capsules by mouth daily. H.A. Joint formula     rosuvastatin (CRESTOR) 20 MG tablet TAKE 1 TABLET BY MOUTH DAILY 90 tablet 0   No current facility-administered medications for this visit.    REVIEW OF SYSTEMS:   Constitutional: ( - ) fevers, ( - )  chills , ( - ) night sweats Eyes: ( - ) blurriness of vision, ( - ) double vision, ( - ) watery eyes Ears, nose, mouth, throat, and face: ( - ) mucositis, ( - ) sore throat Respiratory: ( - ) cough, ( - ) dyspnea, ( - ) wheezes Cardiovascular: ( - ) palpitation, ( - ) chest discomfort, ( - ) lower extremity swelling Gastrointestinal:  ( - ) nausea, ( - ) heartburn, ( - ) change in bowel habits Skin: ( - ) abnormal skin rashes Lymphatics: ( - ) new lymphadenopathy, ( - ) easy bruising Neurological: ( +) numbness, ( - ) tingling, ( - ) new weaknesses Behavioral/Psych: ( - ) mood change, ( - ) new changes  All other systems were reviewed with the patient and are negative.  PHYSICAL EXAMINATION: ECOG PERFORMANCE STATUS: 0 - Asymptomatic  Vitals:   03/14/23 1123  BP: 123/73  Pulse: (!) 56  Resp: 18  Temp: 98.1 F (36.7 C)  SpO2: 98%   Filed Weights   03/14/23 1123  Weight: 234 lb (106.1 kg)    GENERAL: well appearing male in NAD  SKIN: skin color, texture, turgor are normal, no rashes or significant lesions EYES: conjunctiva are pink and non-injected, sclera clear LYMPH:   no palpable lymphadenopathy in the cervical or supraclavicular lymph nodes.  LUNGS: clear to auscultation and percussion with normal breathing effort HEART: regular rate & rhythm and no murmurs and no lower extremity edema Musculoskeletal: no cyanosis of digits and no clubbing  PSYCH: alert & oriented x 3, fluent speech NEURO: no focal motor/sensory deficits  LABORATORY DATA:  I have reviewed the data as listed    Latest Ref Rng & Units 03/14/2023   12:25 PM 03/13/2023    9:41 AM 09/02/2022    2:09 PM  CBC  WBC 4.0 - 10.5 K/uL 6.9  7.9  8.5   Hemoglobin 13.0 - 17.0 g/dL 84.6  96.2  95.2   Hematocrit 39.0 - 52.0 % 41.3  42.8  43.9   Platelets 150 - 400 K/uL 218  208  216        Latest Ref Rng & Units 03/14/2023   12:25 PM 02/13/2023    9:13 AM 10/22/2022    5:07 PM  CMP  Glucose 70 - 99 mg/dL 841  324  401   BUN 8 - 23 mg/dL 20  15  16    Creatinine 0.61 - 1.24 mg/dL 0.27  2.53  6.64   Sodium 135 - 145 mmol/L 139  138  140   Potassium 3.5 - 5.1 mmol/L 4.2  4.3  4.1   Chloride 98 - 111 mmol/L 102  99  100   CO2 22 - 32 mmol/L 30  31  23    Calcium 8.9 - 10.3 mg/dL 9.7  9.3  9.4   Total Protein 6.5 - 8.1 g/dL 7.4  7.2  7.4   Total Bilirubin 0.3 - 1.2 mg/dL 1.5  1.2  1.0   Alkaline Phos 38 - 126 U/L 64  62  103   AST 15 - 41 U/L 24  23  28    ALT 0 - 44 U/L 20  21  29     ASSESSMENT & PLAN Thomas Harding is a 67 y.o. male who presents to the hematology clinic for evaluation of abnormal SPEP that showed elevated IgM levels. We reviewed possible etiologies for elevated IgM including inflammatory process versus plasma cell disorder. We recommend full workup to evaluate  for underlying plasma cell disorder. If there is evidence of monoclonal protein detect in blood or urine, we will proceed with bone met survey and determine if bone marrow biopsy is needed. Patient expressed understanding of the plan provided.   #Polyclonal gammopathy: --SPEP from 10/22/2022 showed elevated IgM measuring 326  mg/dL. IFE showed polyclonal increase in one or more immunoglobulins. No M protein was detected --Labs today to check CBC, CMP, SPEP/IFE, serum free light chains, 24 hour UPEP.  --Determine if bone met survey and bone marrow biopsy are needed based on today's workup.  --RTC once workup is completed.   #Neuropathy in legs: --Under the care of neurologist undergoing workup --Will check vitamin B12 and MMA levels since vitamin B12 was borderline low on 10/22/2022.   Orders Placed This Encounter  Procedures   CBC with Differential (Cancer Center Only)    Standing Status:   Future    Number of Occurrences:   1    Standing Expiration Date:   03/12/2024   CMP (Cancer Center only)    Standing Status:   Future    Number of Occurrences:   1    Standing Expiration Date:   03/12/2024   Multiple Myeloma Panel (SPEP&IFE w/QIG)    Standing Status:   Future    Number of Occurrences:   1    Standing Expiration Date:   03/12/2024   Kappa/lambda light chains    Standing Status:   Future    Number of Occurrences:   1    Standing Expiration Date:   03/12/2024   24-Hr Ur UPEP/UIFE/Light Chains/TP    Standing Status:   Future    Standing Expiration Date:   03/12/2024   Vitamin B12    Standing Status:   Future    Number of Occurrences:   1    Standing Expiration Date:   03/13/2024   Methylmalonic acid, serum    Standing Status:   Future    Number of Occurrences:   1    Standing Expiration Date:   03/13/2024    All questions were answered. The patient knows to call the clinic with any problems, questions or concerns.  I have spent a total of 60 minutes minutes of face-to-face and non-face-to-face time, preparing to see the patient, obtaining and/or reviewing separately obtained history, performing a medically appropriate examination, counseling and educating the patient, ordering tests/procedures, documenting clinical information in the electronic health record, and care coordination.   Georga Kaufmann,  PA-C Department of Hematology/Oncology [facility] at [facility] Phone: [phone]  Patient was seen with Dr. Leonides Schanz.   I have read the above note and personally examined the patient. I agree with the assessment and plan as noted above.  Briefly Thomas Harding is a 67 year old male who presents for evaluation of a polyclonal gammopathy.  The patient and SPEP drawn as part of a neurological workup for neuropathy.  That SPEP showed a polyclonal increase in immunoglobulins.  Today we discussed with the patient that polyclonal increase can be seen with inflammatory conditions or infection.  We also noted that without a monoclonal component there is no need for routine follow-up in our clinic.  Today we will repeat his studies to include SPEP, UPEP, and serum free light chains.  In the event that there is no M protein we do not require any routine follow-up in our clinic.  The patient voiced understanding of our plan moving forward.   Ulysees Barns, MD Department of  Hematology/Oncology [facility] at [facility] Phone: [phone] Pager: [phone] Email: [email or web address]

## 2023-03-17 LAB — KAPPA/LAMBDA LIGHT CHAINS
Kappa free light chain: 23.8 mg/L — ABNORMAL HIGH (ref 3.3–19.4)
Kappa, lambda light chain ratio: 1.16 (ref 0.26–1.65)
Lambda free light chains: 20.6 mg/L (ref 5.7–26.3)

## 2023-03-17 LAB — METHYLMALONIC ACID, SERUM: Methylmalonic Acid, Quantitative: 209 nmol/L (ref 0–378)

## 2023-03-19 ENCOUNTER — Inpatient Hospital Stay: Payer: Medicare Other | Attending: Physician Assistant

## 2023-03-19 ENCOUNTER — Other Ambulatory Visit: Payer: Medicare Other | Admitting: *Deleted

## 2023-03-19 DIAGNOSIS — D890 Polyclonal hypergammaglobulinemia: Secondary | ICD-10-CM | POA: Insufficient documentation

## 2023-03-19 DIAGNOSIS — G4733 Obstructive sleep apnea (adult) (pediatric): Secondary | ICD-10-CM | POA: Diagnosis not present

## 2023-03-21 ENCOUNTER — Encounter: Payer: Self-pay | Admitting: Physician Assistant

## 2023-03-21 LAB — UPEP/UIFE/LIGHT CHAINS/TP, 24-HR UR
% BETA, Urine: 18.3 %
ALPHA 1 URINE: 5.6 %
Albumin, U: 51.9 %
Alpha 2, Urine: 12.2 %
Free Kappa Lt Chains,Ur: 19.97 mg/L (ref 1.17–86.46)
Free Kappa/Lambda Ratio: 5.37 (ref 1.83–14.26)
Free Lambda Lt Chains,Ur: 3.72 mg/L (ref 0.27–15.21)
GAMMA GLOBULIN URINE: 12 %
Total Protein, Urine-Ur/day: 186 mg/24 hr — ABNORMAL HIGH (ref 30–150)
Total Protein, Urine: 6 mg/dL

## 2023-03-24 LAB — MULTIPLE MYELOMA PANEL, SERUM
Albumin SerPl Elph-Mcnc: 3.5 g/dL (ref 2.9–4.4)
Albumin/Glob SerPl: 1.1 (ref 0.7–1.7)
Alpha 1: 0.2 g/dL (ref 0.0–0.4)
Alpha2 Glob SerPl Elph-Mcnc: 0.8 g/dL (ref 0.4–1.0)
B-Globulin SerPl Elph-Mcnc: 1.1 g/dL (ref 0.7–1.3)
Gamma Glob SerPl Elph-Mcnc: 1.2 g/dL (ref 0.4–1.8)
Globulin, Total: 3.3 g/dL (ref 2.2–3.9)
IgA: 158 mg/dL (ref 61–437)
IgG (Immunoglobin G), Serum: 1010 mg/dL (ref 603–1613)
IgM (Immunoglobulin M), Srm: 271 mg/dL — ABNORMAL HIGH (ref 20–172)
Total Protein ELP: 6.8 g/dL (ref 6.0–8.5)

## 2023-03-25 ENCOUNTER — Telehealth: Payer: Self-pay | Admitting: Physician Assistant

## 2023-03-25 NOTE — Telephone Encounter (Signed)
I called [name] to review the lab results from 03/14/2023. Findings show no evidence of monoclonal gammopathy in blood or urine tests. Since there is no evidence of paraproteinemia, Dr. Leonides Schanz does not recommend further hematological workup. His vitamin B12 levels were borderline low so recommend to start OTC vitamin B12 supplementation once daily. Patient can return to our clinic as needed.

## 2023-03-27 ENCOUNTER — Ambulatory Visit (INDEPENDENT_AMBULATORY_CARE_PROVIDER_SITE_OTHER): Payer: Medicare Other | Admitting: Family Medicine

## 2023-03-27 ENCOUNTER — Encounter (INDEPENDENT_AMBULATORY_CARE_PROVIDER_SITE_OTHER): Payer: Self-pay | Admitting: Family Medicine

## 2023-03-27 VITALS — BP 129/71 | HR 58 | Temp 98.2°F | Ht 71.0 in | Wt 223.0 lb

## 2023-03-27 DIAGNOSIS — E7849 Other hyperlipidemia: Secondary | ICD-10-CM

## 2023-03-27 DIAGNOSIS — R7303 Prediabetes: Secondary | ICD-10-CM | POA: Diagnosis not present

## 2023-03-27 DIAGNOSIS — I10 Essential (primary) hypertension: Secondary | ICD-10-CM

## 2023-03-27 DIAGNOSIS — E66811 Obesity, class 1: Secondary | ICD-10-CM

## 2023-03-27 DIAGNOSIS — E559 Vitamin D deficiency, unspecified: Secondary | ICD-10-CM

## 2023-03-27 DIAGNOSIS — E668 Other obesity: Secondary | ICD-10-CM

## 2023-03-27 DIAGNOSIS — Z6831 Body mass index (BMI) 31.0-31.9, adult: Secondary | ICD-10-CM

## 2023-03-27 DIAGNOSIS — E669 Obesity, unspecified: Secondary | ICD-10-CM

## 2023-03-27 MED ORDER — VITAMIN D (ERGOCALCIFEROL) 1.25 MG (50000 UNIT) PO CAPS
50000.0000 [IU] | ORAL_CAPSULE | ORAL | 0 refills | Status: DC
Start: 2023-03-27 — End: 2023-04-24

## 2023-03-27 NOTE — Progress Notes (Unsigned)
Chief Complaint:   OBESITY Thomas Harding is here to discuss his progress with his obesity treatment plan along with follow-up of his obesity related diagnoses. Reason is on the Category 3 Plan and states he is following his eating plan approximately 98% of the time. Thomas Harding states he is walking 10,000 steps 5 times per week.    Today's visit was #: 2 Starting weight: 228 lbs Starting date: 03/13/2023 Today's weight: 223 lbs Today's date: 03/27/2023 Total lbs lost to date: 5 Total lbs lost since last in-office visit: 5  Interim History: Patient was surprised how much he had to eat starting the meal plan.  Going out to eat tends to be difficult not so much for the protein amount but due to the stuff besides the vegetables.  He has a few questions.  Wondering about substituting a kevin's prepared chicken dinner for supper. He took pictures of a few food options for meals.  He did move some of his food around in different meals. For snack calories he is doing 2 fruit popsicles, smart pop popcorn, trader joe's dark chocolate butter cup.  He has rarely felt hungry but not much. Feels satisfied with food on plan.  Next week he is going to [place] to spend time with his family at his beach house.  Biggest obstacle is the going out to eat.  Subjective:   1. Essential hypertension Patient's blood pressure is well-controlled today.  He is on amlodipine 5 mg daily.  2. Other hyperlipidemia Patient's last LDL was 48, HDL 39.8, and triglycerides 157.  He is on Crestor 20 mg daily.  I discussed labs with the patient today.  3. Vitamin D deficiency Patient is on 4,000 IU daily OTC vitamin D.  He notes fatigue, and his last vitamin D level was of 33.57.  I discussed labs with the patient today.  4. Prediabetes Patient's last A1c was 6.0 (diagnosed 2 years ago).  He is not on medications, and his insulin level was 12.4.  I discussed labs with the patient today.  Assessment/Plan:   1. Essential  hypertension Patient will continue amlodipine with no change in dose.  2. Other hyperlipidemia Patient will continue Crestor; LDL at goal, HDL not at goal yet.  3. Vitamin D deficiency Patient will continue vitamin D but changed to prescription vitamin D 50,000 IU once weekly with no refills.  - Vitamin D, Ergocalciferol, (DRISDOL) 1.25 MG (50000 UNIT) CAPS capsule; Take 1 capsule (50,000 Units total) by mouth every 7 (seven) days.  Dispense: 4 capsule; Refill: 0  4. Prediabetes Pathophysiology of insulin resistance, prediabetes, and diabetes mellitus were discussed with the patient today.  He has no hunger or cravings.  We will follow-up on repeat fasting labs in 3 months.  5. BMI 31.0-31.9,adult  6. Class 1 obesity with serious comorbidity and body mass index (BMI) of 32.0 to 32.9 in adult, unspecified obesity type Thomas Harding is currently in the action stage of change. As such, his goal is to continue with weight loss efforts. He has agreed to the Category 3 Plan.   Exercise goals: No exercise has been prescribed at this time.  Behavioral modification strategies: increasing lean protein intake, meal planning and cooking strategies, keeping healthy foods in the home, and planning for success.  Thomas Harding has agreed to follow-up with our clinic in 2 to 3 weeks. He was informed of the importance of frequent follow-up visits to maximize his success with intensive lifestyle modifications for his multiple health  conditions.   Objective:   Blood pressure 129/71, pulse (!) 58, temperature 98.2 F (36.8 C), height 5\' 11"  (1.803 m), weight 223 lb (101.2 kg), SpO2 98 %. Body mass index is 31.1 kg/m.  General: Cooperative, alert, well developed, in no acute distress. HEENT: Conjunctivae and lids unremarkable. Cardiovascular: Regular rhythm.  Lungs: Normal work of breathing. Neurologic: No focal deficits.   Lab Results  Component Value Date   CREATININE 1.14 03/14/2023   BUN 20 03/14/2023    NA 139 03/14/2023   K 4.2 03/14/2023   CL 102 03/14/2023   CO2 30 03/14/2023   Lab Results  Component Value Date   ALT 20 03/14/2023   AST 24 03/14/2023   ALKPHOS 64 03/14/2023   BILITOT 1.5 (H) 03/14/2023   Lab Results  Component Value Date   HGBA1C 6.0 02/13/2023   HGBA1C 6.1 (H) 10/22/2022   HGBA1C 6.1 08/08/2022   HGBA1C 5.5 07/21/2020   Lab Results  Component Value Date   INSULIN 12.4 03/13/2023   Lab Results  Component Value Date   TSH 1.790 03/13/2023   Lab Results  Component Value Date   CHOL 119 02/13/2023   HDL 39.80 02/13/2023   LDLCALC 48 02/13/2023   LDLDIRECT 95.0 02/25/2022   TRIG 157.0 (H) 02/13/2023   CHOLHDL 3 02/13/2023   Lab Results  Component Value Date   VD25OH 33.57 02/13/2023   VD25OH 14.9 (L) 10/22/2022   Lab Results  Component Value Date   WBC 6.9 03/14/2023   HGB 14.0 03/14/2023   HCT 41.3 03/14/2023   MCV 85.9 03/14/2023   PLT 218 03/14/2023   No results found for: "IRON", "TIBC", "FERRITIN"  Attestation Statements:   Reviewed by clinician on day of visit: allergies, medications, problem list, medical history, surgical history, family history, social history, and previous encounter notes.  Time spent on visit including pre-visit chart review and post-visit care and charting was 45 minutes.   I, Burt Knack, am acting as transcriptionist for Reuben Likes, MD.  I have reviewed the above documentation for accuracy and completeness, and I agree with the above. - Reuben Likes, MD

## 2023-03-31 DIAGNOSIS — M5417 Radiculopathy, lumbosacral region: Secondary | ICD-10-CM | POA: Diagnosis not present

## 2023-03-31 DIAGNOSIS — M25562 Pain in left knee: Secondary | ICD-10-CM | POA: Diagnosis not present

## 2023-04-18 DIAGNOSIS — G4733 Obstructive sleep apnea (adult) (pediatric): Secondary | ICD-10-CM | POA: Diagnosis not present

## 2023-04-21 ENCOUNTER — Other Ambulatory Visit (INDEPENDENT_AMBULATORY_CARE_PROVIDER_SITE_OTHER): Payer: Self-pay | Admitting: Family Medicine

## 2023-04-21 DIAGNOSIS — E559 Vitamin D deficiency, unspecified: Secondary | ICD-10-CM

## 2023-04-23 DIAGNOSIS — D485 Neoplasm of uncertain behavior of skin: Secondary | ICD-10-CM | POA: Diagnosis not present

## 2023-04-23 DIAGNOSIS — L570 Actinic keratosis: Secondary | ICD-10-CM | POA: Diagnosis not present

## 2023-04-23 DIAGNOSIS — L821 Other seborrheic keratosis: Secondary | ICD-10-CM | POA: Diagnosis not present

## 2023-04-23 DIAGNOSIS — L820 Inflamed seborrheic keratosis: Secondary | ICD-10-CM | POA: Diagnosis not present

## 2023-04-23 DIAGNOSIS — D225 Melanocytic nevi of trunk: Secondary | ICD-10-CM | POA: Diagnosis not present

## 2023-04-23 DIAGNOSIS — X32XXXA Exposure to sunlight, initial encounter: Secondary | ICD-10-CM | POA: Diagnosis not present

## 2023-04-24 ENCOUNTER — Ambulatory Visit (INDEPENDENT_AMBULATORY_CARE_PROVIDER_SITE_OTHER): Payer: Medicare Other | Admitting: Family Medicine

## 2023-04-24 ENCOUNTER — Encounter (INDEPENDENT_AMBULATORY_CARE_PROVIDER_SITE_OTHER): Payer: Self-pay | Admitting: Family Medicine

## 2023-04-24 VITALS — BP 122/63 | HR 59 | Temp 97.5°F | Ht 71.0 in | Wt 211.0 lb

## 2023-04-24 DIAGNOSIS — Z6829 Body mass index (BMI) 29.0-29.9, adult: Secondary | ICD-10-CM | POA: Insufficient documentation

## 2023-04-24 DIAGNOSIS — R7303 Prediabetes: Secondary | ICD-10-CM

## 2023-04-24 DIAGNOSIS — E669 Obesity, unspecified: Secondary | ICD-10-CM | POA: Diagnosis not present

## 2023-04-24 DIAGNOSIS — E559 Vitamin D deficiency, unspecified: Secondary | ICD-10-CM | POA: Diagnosis not present

## 2023-04-24 MED ORDER — VITAMIN D (ERGOCALCIFEROL) 1.25 MG (50000 UNIT) PO CAPS: 50000.0000 [IU] | ORAL_CAPSULE | ORAL | 0 refills | Status: DC

## 2023-04-28 NOTE — Progress Notes (Signed)
Chief Complaint:   OBESITY Thomas Harding is here to discuss his progress with his obesity treatment plan along with follow-up of his obesity related diagnoses. Thomas Harding is on the Category 3 Plan and states he is following his eating plan approximately 98% of the time. [name] states he is doing pre-weights upper body for 60 minutes 5 times per week.  Today's visit was #: 3 Starting weight: 228 lbs Starting date: 03/13/2023 Today's weight: 211 lbs Today's date: 04/24/2023 Total lbs lost to date: 17 Total lbs lost since last in-office visit: 12  Interim History: Patient continues to lose weight following the Category 3 plan. He is working on eating of of the food on his plan. He is doing well with using his snack calories most nights. He sleeps on an average of 7 hours per night. His muscle mass may be starting to decrease per the bioimpedance scale.   Subjective:   1. Vitamin D deficiency Patient is on Vitamin D prescription with no side effects noted.   2. Prediabetes Patient is doing well with changing his diet and exercise. He is not on metformin, and his polyphagia is controlled.   Assessment/Plan:   1. Vitamin D deficiency Patient will continue prescription Vitamin D, and we will refill for 1 month.   - Vitamin D, Ergocalciferol, (DRISDOL) 1.25 MG (50000 UNIT) CAPS capsule; Take 1 capsule (50,000 Units total) by mouth every 7 (seven) days.  Dispense: 4 capsule; Refill: 0  2. Prediabetes Patient will continue his diet and exercise, and we will recheck labs in 2 months.   3. BMI 29.0-29.9,adult  4. Obesity, Beginning BMI 31.80 French is currently in the action stage of change. As such, his goal is to continue with weight loss efforts. He has agreed to the Category 3 Plan.   Exercise goals: As is.   Behavioral modification strategies: increasing water intake, no skipping meals, and better snacking choices.  Jomari has agreed to follow-up with our clinic in 2 to 3 weeks. He  was informed of the importance of frequent follow-up visits to maximize his success with intensive lifestyle modifications for his multiple health conditions.   Objective:   Blood pressure 122/63, pulse (!) 59, temperature (!) 97.5 F (36.4 C), height 5\' 11"  (1.803 m), weight 211 lb (95.7 kg), SpO2 96%. Body mass index is 29.43 kg/m.  Lab Results  Component Value Date   CREATININE 1.14 03/14/2023   BUN 20 03/14/2023   NA 139 03/14/2023   K 4.2 03/14/2023   CL 102 03/14/2023   CO2 30 03/14/2023   Lab Results  Component Value Date   ALT 20 03/14/2023   AST 24 03/14/2023   ALKPHOS 64 03/14/2023   BILITOT 1.5 (H) 03/14/2023   Lab Results  Component Value Date   HGBA1C 6.0 02/13/2023   HGBA1C 6.1 (H) 10/22/2022   HGBA1C 6.1 08/08/2022   HGBA1C 5.5 07/21/2020   Lab Results  Component Value Date   INSULIN 12.4 03/13/2023   Lab Results  Component Value Date   TSH 1.790 03/13/2023   Lab Results  Component Value Date   CHOL 119 02/13/2023   HDL 39.80 02/13/2023   LDLCALC 48 02/13/2023   LDLDIRECT 95.0 02/25/2022   TRIG 157.0 (H) 02/13/2023   CHOLHDL 3 02/13/2023   Lab Results  Component Value Date   VD25OH 33.57 02/13/2023   VD25OH 14.9 (L) 10/22/2022   Lab Results  Component Value Date   WBC 6.9 03/14/2023   HGB 14.0 03/14/2023  HCT 41.3 03/14/2023   MCV 85.9 03/14/2023   PLT 218 03/14/2023   No results found for: "IRON", "TIBC", "FERRITIN"  Attestation Statements:   Reviewed by clinician on day of visit: allergies, medications, problem list, medical history, surgical history, family history, social history, and previous encounter notes.   I, Burt Knack, am acting as transcriptionist for Quillian Quince, MD.  I have reviewed the above documentation for accuracy and completeness, and I agree with the above. -  Quillian Quince, MD

## 2023-05-07 ENCOUNTER — Other Ambulatory Visit: Payer: Self-pay | Admitting: Cardiology

## 2023-05-07 ENCOUNTER — Telehealth: Payer: Self-pay | Admitting: Cardiology

## 2023-05-07 DIAGNOSIS — R0789 Other chest pain: Secondary | ICD-10-CM

## 2023-05-08 ENCOUNTER — Other Ambulatory Visit: Payer: Self-pay | Admitting: Cardiology

## 2023-05-08 DIAGNOSIS — R0789 Other chest pain: Secondary | ICD-10-CM

## 2023-05-13 ENCOUNTER — Other Ambulatory Visit: Payer: Self-pay

## 2023-05-13 ENCOUNTER — Encounter (INDEPENDENT_AMBULATORY_CARE_PROVIDER_SITE_OTHER): Payer: Self-pay | Admitting: Family Medicine

## 2023-05-13 ENCOUNTER — Ambulatory Visit (INDEPENDENT_AMBULATORY_CARE_PROVIDER_SITE_OTHER): Payer: Medicare Other | Admitting: Family Medicine

## 2023-05-13 VITALS — BP 131/71 | HR 61 | Temp 97.7°F | Ht 71.0 in | Wt 206.0 lb

## 2023-05-13 DIAGNOSIS — E669 Obesity, unspecified: Secondary | ICD-10-CM

## 2023-05-13 DIAGNOSIS — E559 Vitamin D deficiency, unspecified: Secondary | ICD-10-CM | POA: Diagnosis not present

## 2023-05-13 DIAGNOSIS — R0789 Other chest pain: Secondary | ICD-10-CM

## 2023-05-13 DIAGNOSIS — Z6828 Body mass index (BMI) 28.0-28.9, adult: Secondary | ICD-10-CM | POA: Diagnosis not present

## 2023-05-13 DIAGNOSIS — I10 Essential (primary) hypertension: Secondary | ICD-10-CM | POA: Diagnosis not present

## 2023-05-13 MED ORDER — ROSUVASTATIN CALCIUM 20 MG PO TABS: 20.0000 mg | ORAL_TABLET | Freq: Every day | ORAL | 3 refills | Status: DC

## 2023-05-13 MED ORDER — VITAMIN D (ERGOCALCIFEROL) 1.25 MG (50000 UNIT) PO CAPS: 50000.0000 [IU] | ORAL_CAPSULE | ORAL | 0 refills | Status: DC

## 2023-05-13 NOTE — Progress Notes (Signed)
Chief Complaint:   OBESITY Thomas Harding is here to discuss his progress with his obesity treatment plan along with follow-up of his obesity related diagnoses. Thomas Harding is on the Category 3 Plan and states he is following his eating plan approximately 90% of the time. Thomas Harding states he is walking and at the gym for 1 hour-90 minutes 2-3 times per week.  Today's visit was #: 4 Starting weight: 228 lbs Starting date: 03/13/2023 Today's weight: 206 lbs Today's date: 05/13/2023 Total lbs lost to date: 22 Total lbs lost since last in-office visit: 5  Interim History: Patient presents for 4th follow up.  He is following meal plan 90% of the time. Patient is eating off plan 2 meals a week.  He is still getting protein most of the time when eating out.  He is getting a bit more hungry around 3-4pm and he is experiencing stomach rumbling and feels fatigued.  He is going to the beach this weekend and then coming home for a few days then going to [facility] to celebrate his dad's 90th birthday.   Subjective:   1. Vitamin D deficiency Patient is on prescription vitamin D (last vitamin D level was 14.9), and he notes fatigue.  2. Essential hypertension Patient's blood pressure is controlled today, but it is still in the higher range of normal.  He is on amlodipine 5 mg once daily.  He denies chest pain, chest pressure, or headache.  Assessment/Plan:   1. Vitamin D deficiency Patient will continue prescription vitamin D once weekly, and we will refill for 1 month.  - Vitamin D, Ergocalciferol, (DRISDOL) 1.25 MG (50000 UNIT) CAPS capsule; Take 1 capsule (50,000 Units total) by mouth every 7 (seven) days.  Dispense: 4 capsule; Refill: 0  2. Essential hypertension Patient will continue amlodipine with no change in dose.  He hopes to be able to decrease his dose with continued weight loss.  3. BMI 28.0-28.9,adult  4. Obesity with beginning BMI of 31.80 Thomas Harding is currently in the action stage of  change. As such, his goal is to continue with weight loss efforts. He has agreed to the Category 3 Plan.   Exercise goals: All adults should avoid inactivity. Some physical activity is better than none, and adults who participate in any amount of physical activity gain some health benefits.  Behavioral modification strategies: increasing lean protein intake, meal planning and cooking strategies, keeping healthy foods in the home, and planning for success.  Thomas Harding has agreed to follow-up with our clinic in 2 weeks. He was informed of the importance of frequent follow-up visits to maximize his success with intensive lifestyle modifications for his multiple health conditions.   Objective:   Blood pressure 131/71, pulse 61, temperature 97.7 F (36.5 C), height 5\' 11"  (1.803 m), weight 206 lb (93.4 kg), SpO2 100%. Body mass index is 28.73 kg/m.  General: Cooperative, alert, well developed, in no acute distress. HEENT: Conjunctivae and lids unremarkable. Cardiovascular: Regular rhythm.  Lungs: Normal work of breathing. Neurologic: No focal deficits.   Lab Results  Component Value Date   CREATININE 1.14 03/14/2023   BUN 20 03/14/2023   NA 139 03/14/2023   K 4.2 03/14/2023   CL 102 03/14/2023   CO2 30 03/14/2023   Lab Results  Component Value Date   ALT 20 03/14/2023   AST 24 03/14/2023   ALKPHOS 64 03/14/2023   BILITOT 1.5 (H) 03/14/2023   Lab Results  Component Value Date   HGBA1C 6.0 02/13/2023  HGBA1C 6.1 (H) 10/22/2022   HGBA1C 6.1 08/08/2022   HGBA1C 5.5 07/21/2020   Lab Results  Component Value Date   INSULIN 12.4 03/13/2023   Lab Results  Component Value Date   TSH 1.790 03/13/2023   Lab Results  Component Value Date   CHOL 119 02/13/2023   HDL 39.80 02/13/2023   LDLCALC 48 02/13/2023   LDLDIRECT 95.0 02/25/2022   TRIG 157.0 (H) 02/13/2023   CHOLHDL 3 02/13/2023   Lab Results  Component Value Date   VD25OH 33.57 02/13/2023   VD25OH 14.9 (L) 10/22/2022    Lab Results  Component Value Date   WBC 6.9 03/14/2023   HGB 14.0 03/14/2023   HCT 41.3 03/14/2023   MCV 85.9 03/14/2023   PLT 218 03/14/2023   No results found for: "IRON", "TIBC", "FERRITIN"  Attestation Statements:   Reviewed by clinician on day of visit: allergies, medications, problem list, medical history, surgical history, family history, social history, and previous encounter notes.   I, Burt Knack, am acting as transcriptionist for Reuben Likes, MD.  I have reviewed the above documentation for accuracy and completeness, and I agree with the above. - Reuben Likes, MD

## 2023-05-18 ENCOUNTER — Other Ambulatory Visit (INDEPENDENT_AMBULATORY_CARE_PROVIDER_SITE_OTHER): Payer: Self-pay | Admitting: Family Medicine

## 2023-05-18 DIAGNOSIS — E559 Vitamin D deficiency, unspecified: Secondary | ICD-10-CM

## 2023-05-19 DIAGNOSIS — G4733 Obstructive sleep apnea (adult) (pediatric): Secondary | ICD-10-CM | POA: Diagnosis not present

## 2023-05-21 DIAGNOSIS — G4733 Obstructive sleep apnea (adult) (pediatric): Secondary | ICD-10-CM | POA: Diagnosis not present

## 2023-05-21 NOTE — Telephone Encounter (Signed)
Done

## 2023-05-27 ENCOUNTER — Encounter (INDEPENDENT_AMBULATORY_CARE_PROVIDER_SITE_OTHER): Payer: Self-pay | Admitting: Family Medicine

## 2023-05-27 ENCOUNTER — Ambulatory Visit (INDEPENDENT_AMBULATORY_CARE_PROVIDER_SITE_OTHER): Payer: Medicare Other | Admitting: Family Medicine

## 2023-05-27 VITALS — BP 116/70 | HR 52 | Temp 97.8°F | Ht 71.0 in | Wt 206.0 lb

## 2023-05-27 DIAGNOSIS — Z6828 Body mass index (BMI) 28.0-28.9, adult: Secondary | ICD-10-CM

## 2023-05-27 DIAGNOSIS — I10 Essential (primary) hypertension: Secondary | ICD-10-CM | POA: Diagnosis not present

## 2023-05-27 DIAGNOSIS — E559 Vitamin D deficiency, unspecified: Secondary | ICD-10-CM

## 2023-05-27 DIAGNOSIS — E669 Obesity, unspecified: Secondary | ICD-10-CM

## 2023-05-27 MED ORDER — VITAMIN D (ERGOCALCIFEROL) 1.25 MG (50000 UNIT) PO CAPS
50000.0000 [IU] | ORAL_CAPSULE | ORAL | 0 refills | Status: DC
Start: 2023-05-27 — End: 2023-06-12

## 2023-05-27 NOTE — Progress Notes (Signed)
Chief Complaint:   OBESITY Thomas Harding is here to discuss his progress with his obesity treatment plan along with follow-up of his obesity related diagnoses. Thomas Harding is on the Category 3 Plan and states he is following his eating plan approximately 75-80% of the time. Thomas Harding states he is walking on the beach for 30 minutes 7 times per week.  Today's visit was #: 5 Starting weight: 228 lbs Starting date: 03/13/2023 Today's weight: 206 lbs Today's date: 05/27/2023 Total lbs lost to date: 22 Total lbs lost since last in-office visit: 0  Interim History: Patient presents for follow up- he has been down in [facility] all last week celebrating his dad's 90th birthday with extended and immediate family.  He mentions he didn't stay on the meal plan as well as he wanted to particularly at dinner.  Half of lunch was eaten after supper. He doesn't have much planned for the next few week except for possibly going to his beach house. 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   Subjective:   1. Vitamin D deficiency Patient is on prescription vitamin D.  He denies nausea, vomiting, or muscle weakness but notes fatigue.  2. Essential hypertension Patient's blood pressure is controlled today.  He denies chest pain, chest pressure, or headache.  He is on Norvasc and Toprol.  Assessment/Plan:   1. Vitamin D deficiency Patient will continue prescription vitamin D once weekly, and we will refill for 1 month.  - Vitamin D, Ergocalciferol, (DRISDOL) 1.25 MG (50000 UNIT) CAPS capsule; Take 1 capsule (50,000 Units total) by mouth every 7 (seven) days.  Dispense: 4 capsule; Refill: 0  2. Essential hypertension Patient will continue his current medications with no change in medication or dose.  3. BMI 28.0-28.9,adult  4. Obesity with starting BMI of 32.8 Thomas Harding is currently in the action stage of change. As such, his goal is to continue with weight  loss efforts. He has agreed to the Category 3 Plan.   Exercise goals: All adults should avoid inactivity. Some physical activity is better than none, and adults who participate in any amount of physical activity gain some health benefits.  Behavioral modification strategies: increasing lean protein intake, meal planning and cooking strategies, keeping healthy foods in the home, and planning for success.  Thomas Harding has agreed to follow-up with our clinic in 2 weeks. He was informed of the importance of frequent follow-up visits to maximize his success with intensive lifestyle modifications for his multiple health conditions.   Objective:   Blood pressure 116/70, pulse (!) 52, temperature 97.8 F (36.6 C), height 5\' 11"  (1.803 m), weight 206 lb (93.4 kg), SpO2 98%. Body mass index is 28.73 kg/m.  General: Cooperative, alert, well developed, in no acute distress. HEENT: Conjunctivae and lids unremarkable. Cardiovascular: Regular rhythm.  Lungs: Normal work of breathing. Neurologic: No focal deficits.   Lab Results  Component Value Date   CREATININE 1.14 03/14/2023   BUN 20 03/14/2023   NA 139 03/14/2023   K 4.2 03/14/2023   CL 102 03/14/2023   CO2 30 03/14/2023   Lab Results  Component Value Date   ALT 20 03/14/2023   AST 24 03/14/2023   ALKPHOS 64 03/14/2023   BILITOT 1.5 (H) 03/14/2023   Lab Results  Component Value Date   HGBA1C 6.0 02/13/2023   HGBA1C 6.1 (H) 10/22/2022   HGBA1C 6.1 08/08/2022   HGBA1C 5.5 07/21/2020   Lab Results  Component  Value Date   INSULIN 12.4 03/13/2023   Lab Results  Component Value Date   TSH 1.790 03/13/2023   Lab Results  Component Value Date   CHOL 119 02/13/2023   HDL 39.80 02/13/2023   LDLCALC 48 02/13/2023   LDLDIRECT 95.0 02/25/2022   TRIG 157.0 (H) 02/13/2023   CHOLHDL 3 02/13/2023   Lab Results  Component Value Date   VD25OH 33.57 02/13/2023   VD25OH 14.9 (L) 10/22/2022   Lab Results  Component Value Date   WBC 6.9  03/14/2023   HGB 14.0 03/14/2023   HCT 41.3 03/14/2023   MCV 85.9 03/14/2023   PLT 218 03/14/2023   No results found for: "IRON", "TIBC", "FERRITIN"  Attestation Statements:   Reviewed by clinician on day of visit: allergies, medications, problem list, medical history, surgical history, family history, social history, and previous encounter notes.   I, Burt Knack, am acting as transcriptionist for Reuben Likes, MD.  I have reviewed the above documentation for accuracy and completeness, and I agree with the above. - Reuben Likes, MD

## 2023-06-05 ENCOUNTER — Other Ambulatory Visit: Payer: Self-pay | Admitting: Cardiology

## 2023-06-12 ENCOUNTER — Encounter (INDEPENDENT_AMBULATORY_CARE_PROVIDER_SITE_OTHER): Payer: Self-pay | Admitting: Family Medicine

## 2023-06-12 ENCOUNTER — Ambulatory Visit (INDEPENDENT_AMBULATORY_CARE_PROVIDER_SITE_OTHER): Payer: Medicare Other | Admitting: Family Medicine

## 2023-06-12 VITALS — BP 112/61 | HR 60 | Temp 97.5°F | Ht 71.0 in | Wt 200.0 lb

## 2023-06-12 DIAGNOSIS — I10 Essential (primary) hypertension: Secondary | ICD-10-CM

## 2023-06-12 DIAGNOSIS — Z6828 Body mass index (BMI) 28.0-28.9, adult: Secondary | ICD-10-CM | POA: Diagnosis not present

## 2023-06-12 DIAGNOSIS — E559 Vitamin D deficiency, unspecified: Secondary | ICD-10-CM

## 2023-06-12 DIAGNOSIS — E669 Obesity, unspecified: Secondary | ICD-10-CM

## 2023-06-12 MED ORDER — VITAMIN D (ERGOCALCIFEROL) 1.25 MG (50000 UNIT) PO CAPS
50000.0000 [IU] | ORAL_CAPSULE | ORAL | 0 refills | Status: DC
Start: 2023-06-12 — End: 2023-07-07

## 2023-06-12 NOTE — Progress Notes (Unsigned)
Chief Complaint:   OBESITY Thomas Harding is here to discuss his progress with his obesity treatment plan along with follow-up of his obesity related diagnoses. [name] is on the Category 3 Plan and states he is following his eating plan approximately 90% of the time. [name] states he is walking 9,000-10,000 steps 7 times per week.    Today's visit was #: 6 Starting weight: 228 lbs Starting date: 03/13/2023 Today's weight: 200 lbs Today's date: 06/12/2023 Total lbs lost to date: 28 Total lbs lost since last in-office visit: 6  Interim History: Since last appointment patient has been somewhat working and adding about a 15-20 minute walk in the evening which is about a mile.  He is going to the beach this evening for the next 5 days. Foodwise he has been doing pretty much the same thing he was doing previously.  There has been a few eating out experiences with chips and other indulgences but ensured he got a protein shake in later to ensure he to his protein in.  Sometimes is 50-100 calories short.   Subjective:   1. Vitamin D deficiency Patient is on prescription vitamin D, and he denies nausea, vomiting, or muscle weakness but notes fatigue.  His vitamin D level was of 33.57 in early May.  2. Essential hypertension Patient's blood pressure is controlled today.  He is on amlodipine and Toprol.  He denies chest pain, chest pressure, or headache.  Assessment/Plan:   1. Vitamin D deficiency Patient will continue prescription vitamin D once weekly, and we will refill for 1 month.  - Vitamin D, Ergocalciferol, (DRISDOL) 1.25 MG (50000 UNIT) CAPS capsule; Take 1 capsule (50,000 Units total) by mouth every 7 (seven) days.  Dispense: 4 capsule; Refill: 0  2. Essential hypertension Patient will continue his current medications at same doses with no change; if his blood pressure continues to decrease we will decrease amlodipine.  3. BMI 28.0-28.9,adult  4. Obesity with starting BMI of  32.8 Harding is currently in the action stage of change. As such, his goal is to continue with weight loss efforts. He has agreed to the Category 3 Plan.   Exercise goals: All adults should avoid inactivity. Some physical activity is better than none, and adults who participate in any amount of physical activity gain some health benefits.  Behavioral modification strategies: increasing lean protein intake, meal planning and cooking strategies, better snacking choices, and planning for success.  [name] has agreed to follow-up with our clinic in 3 to 4 weeks. He was informed of the importance of frequent follow-up visits to maximize his success with intensive lifestyle modifications for his multiple health conditions.   Objective:   Blood pressure 112/61, pulse 60, temperature (!) 97.5 F (36.4 C), height 5\' 11"  (1.803 m), weight 200 lb (90.7 kg), SpO2 97%. Body mass index is 27.89 kg/m.  General: Cooperative, alert, well developed, in no acute distress. HEENT: Conjunctivae and lids unremarkable. Cardiovascular: Regular rhythm.  Lungs: Normal work of breathing. Neurologic: No focal deficits.   Lab Results  Component Value Date   CREATININE 1.14 03/14/2023   BUN 20 03/14/2023   NA 139 03/14/2023   K 4.2 03/14/2023   CL 102 03/14/2023   CO2 30 03/14/2023   Lab Results  Component Value Date   ALT 20 03/14/2023   AST 24 03/14/2023   ALKPHOS 64 03/14/2023   BILITOT 1.5 (H) 03/14/2023   Lab Results  Component Value Date   HGBA1C 6.0 02/13/2023  HGBA1C 6.1 (H) 10/22/2022   HGBA1C 6.1 08/08/2022   HGBA1C 5.5 07/21/2020   Lab Results  Component Value Date   INSULIN 12.4 03/13/2023   Lab Results  Component Value Date   TSH 1.790 03/13/2023   Lab Results  Component Value Date   CHOL 119 02/13/2023   HDL 39.80 02/13/2023   LDLCALC 48 02/13/2023   LDLDIRECT 95.0 02/25/2022   TRIG 157.0 (H) 02/13/2023   CHOLHDL 3 02/13/2023   Lab Results  Component Value Date    VD25OH 33.57 02/13/2023   VD25OH 14.9 (L) 10/22/2022   Lab Results  Component Value Date   WBC 6.9 03/14/2023   HGB 14.0 03/14/2023   HCT 41.3 03/14/2023   MCV 85.9 03/14/2023   PLT 218 03/14/2023   No results found for: "IRON", "TIBC", "FERRITIN"  Attestation Statements:   Reviewed by clinician on day of visit: allergies, medications, problem list, medical history, surgical history, family history, social history, and previous encounter notes.   I, Burt Knack, am acting as transcriptionist for Reuben Likes, MD.  I have reviewed the above documentation for accuracy and completeness, and I agree with the above. - Reuben Likes, MD

## 2023-06-18 ENCOUNTER — Ambulatory Visit: Payer: Medicare Other | Attending: Internal Medicine | Admitting: Internal Medicine

## 2023-06-18 ENCOUNTER — Encounter: Payer: Self-pay | Admitting: Internal Medicine

## 2023-06-18 VITALS — BP 118/72 | HR 55 | Resp 16 | Ht 71.0 in | Wt 207.0 lb

## 2023-06-18 DIAGNOSIS — M48061 Spinal stenosis, lumbar region without neurogenic claudication: Secondary | ICD-10-CM | POA: Diagnosis not present

## 2023-06-18 DIAGNOSIS — R899 Unspecified abnormal finding in specimens from other organs, systems and tissues: Secondary | ICD-10-CM

## 2023-06-18 DIAGNOSIS — G6289 Other specified polyneuropathies: Secondary | ICD-10-CM | POA: Diagnosis not present

## 2023-06-18 DIAGNOSIS — M21372 Foot drop, left foot: Secondary | ICD-10-CM | POA: Diagnosis not present

## 2023-06-18 DIAGNOSIS — R600 Localized edema: Secondary | ICD-10-CM | POA: Diagnosis not present

## 2023-06-18 NOTE — Progress Notes (Signed)
Office Visit Note  Patient: Thomas Harding             Date of Birth: January 02, 1956           MRN: [id number]             PCP: Shade Flood, MD Referring: Shade Flood, MD Visit Date: 06/18/2023 Occupation: Geophysical data processor  Subjective:  New Patient (Initial Visit) (Patient states he wakes up with cramping in his legs. Patient states it was usually at night and only in his thighs. Patient states now he is having cramps in the morning in his shins and feet. )   History of Present Illness: Thomas Harding is a 67 y.o. 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  He has a medical history significant for OSA, hyperlipidemia, and paroxysmal atrial tachycardia.  He has a longstanding history of sciatica affecting the left leg for years but more recent complaint started more than 1 year ago with bilateral numbness in the feet and distal legs.  This was not really painful more absence of sensation or a tingling sensation.  He does experience cramping in his legs this is bothersome at night and will sometimes wake him.  The numbness in his feet has caused a few falls also causes for difficulty as he cannot stably climb and descend ladders.  Usually falls when he stepped onto uneven surfaces and has to watch the ground. 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  He had MRI of the lumbar spine indicating severe facet arthropathy at L5-S1 and had a previous decompression surgery due to facet arthropathy on the left side.  Extensive lab testing was checked in neurology office in January this demonstrated the positive rheumatoid factor and polyclonal IgM increase.  He is not on any specific medication for neuropathy.  He was found to have vitamin  D deficiency and primary care office started on high-dose supplementation for this.  Labs reviewed 10/2022 ANA neg RF 21.3 ESR 7 CRP 2 CK 144 SPEP- IgM 271  Activities of Daily Living:  Patient reports morning stiffness for 2 minutes.   Patient Denies nocturnal pain.  Difficulty dressing/grooming: Denies Difficulty climbing stairs: Denies Difficulty getting out of chair: Denies Difficulty using hands for taps, buttons, cutlery, and/or writing: Denies  Review of Systems  Constitutional:  Negative for fatigue.  HENT:  Positive for mouth dryness. Negative for mouth sores.   Eyes:  Negative for dryness.  Respiratory:  Negative for shortness of breath.   Cardiovascular:  Positive for chest pain. Negative for palpitations.  Gastrointestinal:  Negative for blood in stool, constipation and diarrhea.  Endocrine: Negative for increased urination.  Genitourinary:  Negative for involuntary urination.  Musculoskeletal:  Positive for gait problem and morning stiffness. Negative for joint pain, joint pain, joint swelling, myalgias, muscle weakness, muscle tenderness and myalgias.  Skin:  Negative for color change, rash, hair loss and sensitivity to sunlight.  Allergic/Immunologic: Negative for susceptible to infections.  Neurological:  Negative for dizziness and headaches.  Hematological:  Negative for swollen glands.  Psychiatric/Behavioral:  Negative for depressed mood and sleep disturbance. The patient is not nervous/anxious.     PMFS History:  Patient Active Problem List   Diagnosis Date Noted   Abnormal laboratory test result 06/18/2023  Peripheral edema 06/18/2023   BMI 29.0-29.9,adult 04/24/2023   Mixed hyperlipidemia 02/16/2023   Prediabetes 02/16/2023   Left foot drop 02/16/2023   Peripheral neuropathy 02/16/2023   Vitamin D deficiency 02/16/2023   Gastroesophageal reflux disease 10/01/2022   Personal history of colonic polyps 10/01/2022   PVC's (premature ventricular  contractions) 09/12/2022   Atypical chest pain 04/26/2022   Apnea spell 04/26/2022   Fatigue 04/26/2022   Morbid obesity (HCC) 04/26/2022   Generalized obesity 04/26/2022   Elevated coronary artery calcium score 04/25/2022   Abnormal stress test 04/25/2022   Claudication (HCC) 01/31/2022   Paroxysmal atrial tachycardia 08/02/2021   Precordial pain 06/07/2021   Bradycardia 06/22/2020   PVC (premature ventricular contraction) 06/22/2020   Spinal stenosis of lumbar region 04/20/2020   Dyspnea 11/22/2016    Past Medical History:  Diagnosis Date   High cholesterol    Hyperlipidemia    Hypertension    Joint pain    Leg pain    Neuropathy    OSA (obstructive sleep apnea)    PVC's (premature ventricular contractions)    Sleep apnea    SOB (shortness of breath)    Squamous cell carcinoma of skin 01/16/2017   in situ- right crown    Vitamin D deficiency     Family History  Problem Relation Age of Onset   Hypertension Mother    Hyperlipidemia Father    Neuropathy Brother    Sleep apnea Neg Hx    Past Surgical History:  Procedure Laterality Date   LEFT HEART CATH AND CORONARY ANGIOGRAPHY N/A 05/14/2022   Procedure: LEFT HEART CATH AND CORONARY ANGIOGRAPHY;  Surgeon: Elder Negus, MD;  Location: [facility];  Service: Cardiovascular;  Laterality: N/A;   LUMBAR LAMINECTOMY/DECOMPRESSION MICRODISCECTOMY Left 05/18/2020   Procedure: Microlumbar decompression Lumbar Four- Lumbar Five, Lumbar Five- Sacral One left;  Surgeon: Jene Every, MD;  Location: [facility] OR;  Service: Orthopedics;  Laterality: Left;  Microlumbar decompression Lumbar Five- Sacral One left   PVC ABLATION N/A 09/11/2022   Procedure: PVC ABLATION;  Surgeon: Marinus Maw, MD;  Location: [facility];  Service: Cardiovascular;  Laterality: N/A;   ROTATOR CUFF REPAIR Right    thumb surgery Left    TONSILLECTOMY     VASECTOMY     Social History   Social History Narrative   Not on file    Immunization History  Administered Date(s) Administered   Fluad Quad(high Dose 65+) 07/31/2022   Influenza,inj,Quad PF,6+ Mos 06/24/2018, 07/26/2020, 08/20/2021   Moderna SARS-COV2 Booster Vaccination 08/21/2020   Moderna Sars-Covid-2 Vaccination 12/11/2019, 01/08/2020   PNEUMOCOCCAL CONJUGATE-20 07/31/2022   Tdap 03/15/2017     Objective: Vital Signs: BP 118/72 (BP Location: Right Arm, Patient Position: Sitting, Cuff Size: Normal)   Pulse (!) 55   Resp 16   Ht 5\' 11"  (1.803 m)   Wt 207 lb (93.9 kg)   BMI 28.87 kg/m    Physical Exam Eyes:     Conjunctiva/sclera: Conjunctivae normal.  Cardiovascular:     Rate and Rhythm: Normal rate and regular rhythm.  Pulmonary:     Effort: Pulmonary effort is normal.     Breath sounds: Normal breath sounds.  Lymphadenopathy:     Cervical: No cervical adenopathy.  Skin:    General: Skin is warm and dry.     Comments: Mild petechial rash in distal lower legs, no pitting edema  Neurological:     Mental Status: He is alert.     Comments:  Left ankle jerk reflex absent right is present, normal symmetric knee jerk reflexes  Psychiatric:        Mood and Affect: Mood normal.      Musculoskeletal Exam:  Shoulders full ROM no tenderness or swelling Elbows full ROM no tenderness or swelling Wrists full ROM no tenderness or swelling Fingers full ROM, heberdon's nodes DIP joints worst 2nd-3rd digits no tenderness no swelling Knees full ROM no tenderness or swelling Ankles full ROM no tenderness or swelling MTPs full ROM no tenderness or swelling   Investigation: No additional findings.  Imaging: No results found.  Recent Labs: Lab Results  Component Value Date   WBC 6.9 03/14/2023   HGB 14.0 03/14/2023   PLT 218 03/14/2023   NA 139 03/14/2023   K 4.2 03/14/2023   CL 102 03/14/2023   CO2 30 03/14/2023   GLUCOSE 104 (H) 03/14/2023   BUN 20 03/14/2023   CREATININE 1.14 03/14/2023   BILITOT 1.5 (H) 03/14/2023   ALKPHOS 64  03/14/2023   AST 24 03/14/2023   ALT 20 03/14/2023   PROT 7.4 03/14/2023   ALBUMIN 4.3 03/14/2023   CALCIUM 9.7 03/14/2023   GFRAA 72 10/11/2020    Speciality Comments: No specialty comments available.  Procedures:  No procedures performed Allergies: Bee venom and Penicillins   Assessment / Plan:     Visit Diagnoses: Abnormal laboratory test result - Plan: Cyclic citrul peptide antibody, IgG, B Nat Peptide, Sedimentation rate, C-reactive protein, Protein / creatinine ratio, urine, Sjogrens syndrome-A extractable nuclear antibody, Sjogrens syndrome-B extractable nuclear antibody  Cannot appreciate any peripheral joint synovitis on exam today.  Will check additional RA antibody markers also screening for Sjogren syndrome which could present with positive rheumatoid factor during systemic disease.  Also checking for urine protein.  Clinical associations with results and described symptoms such as RA, Sjogren's, cryoglobulinemia, vasculitis did not appear very typical for his presentation.  Already had screening for myeloma was not indicative.  Other polyneuropathy Spinal stenosis of lumbar region, unspecified whether neurogenic claudication present Left foot drop  Despite left-sided motor axonal involvement symptoms are more sensory than motor in the bilateral involvement and left side may also be attributed by his lumbar spine disease.  Peripheral edema - Plan: B Nat Peptide  Minimal edema at time of exam but patient reports intermittently higher level of swelling and mild petechiae present suspect stasis dermatitis related.  Is on amlodipine could contribute to edema.  Will check a BNP.  Orders: Orders Placed This Encounter  Procedures   Cyclic citrul peptide antibody, IgG   B Nat Peptide   Sedimentation rate   C-reactive protein   Protein / creatinine ratio, urine   Sjogrens syndrome-A extractable nuclear antibody   Sjogrens syndrome-B extractable nuclear antibody   No orders  of the defined types were placed in this encounter.    Follow-Up Instructions: No follow-ups on file.   Fuller Plan, MD  Note - This record has been created using [employer].  Chart creation errors have been sought, but may not always  have been located. Such creation errors do not reflect on  the standard of medical care.

## 2023-06-22 LAB — SJOGRENS SYNDROME-B EXTRACTABLE NUCLEAR ANTIBODY: SSB (La) (ENA) Antibody, IgG: <1 AI

## 2023-06-22 LAB — BRAIN NATRIURETIC PEPTIDE: Brain Natriuretic Peptide: 29 pg/mL (ref <100)

## 2023-06-22 LAB — CYCLIC CITRUL PEPTIDE ANTIBODY, IGG: Cyclic Citrullin Peptide Ab: <16 U

## 2023-06-22 LAB — SJOGRENS SYNDROME-A EXTRACTABLE NUCLEAR ANTIBODY: SSA (Ro) (ENA) Antibody, IgG: <1 AI

## 2023-06-22 LAB — PROTEIN / CREATININE RATIO, URINE
Creatinine, Urine: 69 mg/dL (ref 20–320)
Protein/Creat Ratio: 174 mg/g{creat} — ABNORMAL HIGH (ref 25–148)
Protein/Creatinine Ratio: 0.174 mg/mg{creat} — ABNORMAL HIGH (ref 0.025–0.148)
Total Protein, Urine: 12 mg/dL (ref 5–25)

## 2023-06-22 LAB — SEDIMENTATION RATE: Sed Rate: 9 mm/h (ref 0–20)

## 2023-06-22 LAB — C-REACTIVE PROTEIN: CRP: <3 mg/L (ref <8.0)

## 2023-07-03 ENCOUNTER — Ambulatory Visit (INDEPENDENT_AMBULATORY_CARE_PROVIDER_SITE_OTHER): Payer: Medicare Other | Admitting: Family Medicine

## 2023-07-03 ENCOUNTER — Encounter (INDEPENDENT_AMBULATORY_CARE_PROVIDER_SITE_OTHER): Payer: Self-pay | Admitting: Family Medicine

## 2023-07-03 VITALS — BP 118/60 | HR 57 | Temp 98.7°F | Ht 71.0 in | Wt 196.0 lb

## 2023-07-03 DIAGNOSIS — E669 Obesity, unspecified: Secondary | ICD-10-CM | POA: Diagnosis not present

## 2023-07-03 DIAGNOSIS — Z6827 Body mass index (BMI) 27.0-27.9, adult: Secondary | ICD-10-CM | POA: Diagnosis not present

## 2023-07-03 DIAGNOSIS — R0609 Other forms of dyspnea: Secondary | ICD-10-CM | POA: Diagnosis not present

## 2023-07-03 DIAGNOSIS — E559 Vitamin D deficiency, unspecified: Secondary | ICD-10-CM | POA: Diagnosis not present

## 2023-07-03 MED ORDER — AMLODIPINE BESYLATE 5 MG PO TABS
2.5000 mg | ORAL_TABLET | Freq: Every day | ORAL | Status: DC
Start: 2023-07-03 — End: 2023-08-13
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
## 2023-07-03 NOTE — Telephone Encounter (Signed)
This encounter was created in error - please disregard.

## 2023-07-06 ENCOUNTER — Other Ambulatory Visit (INDEPENDENT_AMBULATORY_CARE_PROVIDER_SITE_OTHER): Payer: Self-pay | Admitting: Family Medicine

## 2023-07-06 DIAGNOSIS — E559 Vitamin D deficiency, unspecified: Secondary | ICD-10-CM

## 2023-07-07 MED ORDER — VITAMIN D (ERGOCALCIFEROL) 1.25 MG (50000 UNIT) PO CAPS
50000.0000 [IU] | ORAL_CAPSULE | ORAL | 0 refills | Status: DC
Start: 2023-07-07 — End: 2023-08-04

## 2023-07-16 ENCOUNTER — Other Ambulatory Visit: Payer: Self-pay | Admitting: Family Medicine

## 2023-07-16 DIAGNOSIS — K219 Gastro-esophageal reflux disease without esophagitis: Secondary | ICD-10-CM

## 2023-07-23 ENCOUNTER — Ambulatory Visit (INDEPENDENT_AMBULATORY_CARE_PROVIDER_SITE_OTHER): Payer: Medicare Other | Admitting: Family Medicine

## 2023-07-31 ENCOUNTER — Other Ambulatory Visit (INDEPENDENT_AMBULATORY_CARE_PROVIDER_SITE_OTHER): Payer: Self-pay | Admitting: Family Medicine

## 2023-07-31 DIAGNOSIS — E559 Vitamin D deficiency, unspecified: Secondary | ICD-10-CM

## 2023-08-04 ENCOUNTER — Encounter (INDEPENDENT_AMBULATORY_CARE_PROVIDER_SITE_OTHER): Payer: Self-pay | Admitting: Family Medicine

## 2023-08-04 ENCOUNTER — Ambulatory Visit (INDEPENDENT_AMBULATORY_CARE_PROVIDER_SITE_OTHER): Payer: Medicare Other | Admitting: Family Medicine

## 2023-08-04 VITALS — BP 117/68 | HR 60 | Temp 97.7°F | Ht 71.0 in | Wt 198.0 lb

## 2023-08-04 DIAGNOSIS — E559 Vitamin D deficiency, unspecified: Secondary | ICD-10-CM

## 2023-08-04 DIAGNOSIS — I10 Essential (primary) hypertension: Secondary | ICD-10-CM

## 2023-08-04 DIAGNOSIS — Z6831 Body mass index (BMI) 31.0-31.9, adult: Secondary | ICD-10-CM

## 2023-08-04 DIAGNOSIS — Z6827 Body mass index (BMI) 27.0-27.9, adult: Secondary | ICD-10-CM | POA: Diagnosis not present

## 2023-08-04 DIAGNOSIS — E669 Obesity, unspecified: Secondary | ICD-10-CM | POA: Diagnosis not present

## 2023-08-04 MED ORDER — VITAMIN D (ERGOCALCIFEROL) 1.25 MG (50000 UNIT) PO CAPS
50000.0000 [IU] | ORAL_CAPSULE | ORAL | 0 refills | Status: DC
Start: 2023-08-04 — End: 2023-08-25

## 2023-08-04 NOTE — Progress Notes (Signed)
Chief Complaint:   OBESITY Konor is here to discuss his progress with his obesity treatment plan along with follow-up of his obesity related diagnoses. Abrar is on the Category 3 Plan and states he is following his eating plan approximately 80% of the time. Octavion states he is lifting weights, and walking for 60 minutes 2-5 times per week.  Today's visit was #: 8 Starting weight: 228 lbs Starting date: 03/13/2023 Today's weight: 198 lbs Today's date: 08/04/2023 Total lbs lost to date: 30 Total lbs lost since last in-office visit: 0  Interim History: Patient presents for follow up. He has noticed non scale victories in terms of putting notches in his belts because of loss.  Breakfast and lunch are by the plan 100% of the time.  Dinner the last week or two has been more pick up rather than cooking at home.  More of the what it is cooked in than what the choices are.  He has had a few football tailgates that may have made some impact.  Next few weeks he is planning to go play golf for 3-4 days then putting cabinets in a buddy's house for a few days. Previously drank around 9 bottles of day of water and now drinking around 3 bottles.  Subjective:   1. Vitamin D deficiency Patient is on prescription Vitamin D. His last Vitamin D level was 5 months ago.   2. Essential hypertension Patient's blood pressure is controlled today. He is on amlodipine 2.5 mg with great blood pressure control.   Assessment/Plan:   1. Vitamin D deficiency We will refill prescription Vitamin D 50,000 IU once weekly for 1 month.   - Vitamin D, Ergocalciferol, (DRISDOL) 1.25 MG (50000 UNIT) CAPS capsule; Take 1 capsule (50,000 Units total) by mouth every 7 (seven) days.  Dispense: 4 capsule; Refill: 0  2. Essential hypertension Patient will continue amlodipine with no change in medication or dose.   3. BMI 27.0-27.9,adult  4. Obesity, current BMI 27.6 Efthimios is currently in the action stage of change.  As such, his goal is to continue with weight loss efforts. He has agreed to the Category 3 Plan.   Exercise goals: As is.   Behavioral modification strategies: increasing lean protein intake, meal planning and cooking strategies, keeping healthy foods in the home, and planning for success.  Tymeir has agreed to follow-up with our clinic in 3 to 5 weeks. He was informed of the importance of frequent follow-up visits to maximize his success with intensive lifestyle modifications for his multiple health conditions.   Objective:   Blood pressure 117/68, pulse 60, temperature 97.7 F (36.5 C), height 5\' 11"  (1.803 m), weight 198 lb (89.8 kg), SpO2 97%. Body mass index is 27.62 kg/m.  General: Cooperative, alert, well developed, in no acute distress. HEENT: Conjunctivae and lids unremarkable. Cardiovascular: Regular rhythm.  Lungs: Normal work of breathing. Neurologic: No focal deficits.   Lab Results  Component Value Date   CREATININE 1.14 03/14/2023   BUN 20 03/14/2023   NA 139 03/14/2023   K 4.2 03/14/2023   CL 102 03/14/2023   CO2 30 03/14/2023   Lab Results  Component Value Date   ALT 20 03/14/2023   AST 24 03/14/2023   ALKPHOS 64 03/14/2023   BILITOT 1.5 (H) 03/14/2023   Lab Results  Component Value Date   HGBA1C 6.0 02/13/2023   HGBA1C 6.1 (H) 10/22/2022   HGBA1C 6.1 08/08/2022   HGBA1C 5.5 07/21/2020   Lab Results  Component Value Date   INSULIN 12.4 03/13/2023   Lab Results  Component Value Date   TSH 1.790 03/13/2023   Lab Results  Component Value Date   CHOL 119 02/13/2023   HDL 39.80 02/13/2023   LDLCALC 48 02/13/2023   LDLDIRECT 95.0 02/25/2022   TRIG 157.0 (H) 02/13/2023   CHOLHDL 3 02/13/2023   Lab Results  Component Value Date   VD25OH 33.57 02/13/2023   VD25OH 14.9 (L) 10/22/2022   Lab Results  Component Value Date   WBC 6.9 03/14/2023   HGB 14.0 03/14/2023   HCT 41.3 03/14/2023   MCV 85.9 03/14/2023   PLT 218 03/14/2023   No  results found for: "IRON", "TIBC", "FERRITIN"  Attestation Statements:   Reviewed by clinician on day of visit: allergies, medications, problem list, medical history, surgical history, family history, social history, and previous encounter notes.   I, Burt Knack, am acting as transcriptionist for Reuben Likes, MD.  I have reviewed the above documentation for accuracy and completeness, and I agree with the above. - Reuben Likes, MD

## 2023-08-13 ENCOUNTER — Other Ambulatory Visit: Payer: Self-pay | Admitting: Cardiology

## 2023-08-13 DIAGNOSIS — R0609 Other forms of dyspnea: Secondary | ICD-10-CM

## 2023-08-20 ENCOUNTER — Ambulatory Visit: Payer: Medicare Other | Admitting: Family Medicine

## 2023-08-25 ENCOUNTER — Encounter (INDEPENDENT_AMBULATORY_CARE_PROVIDER_SITE_OTHER): Payer: Self-pay | Admitting: Family Medicine

## 2023-08-25 ENCOUNTER — Ambulatory Visit (INDEPENDENT_AMBULATORY_CARE_PROVIDER_SITE_OTHER): Payer: Medicare Other | Admitting: Family Medicine

## 2023-08-25 VITALS — BP 117/67 | HR 59 | Temp 97.7°F | Ht 71.0 in | Wt 196.0 lb

## 2023-08-25 DIAGNOSIS — R7303 Prediabetes: Secondary | ICD-10-CM

## 2023-08-25 DIAGNOSIS — Z6827 Body mass index (BMI) 27.0-27.9, adult: Secondary | ICD-10-CM | POA: Diagnosis not present

## 2023-08-25 DIAGNOSIS — E669 Obesity, unspecified: Secondary | ICD-10-CM

## 2023-08-25 DIAGNOSIS — E559 Vitamin D deficiency, unspecified: Secondary | ICD-10-CM

## 2023-08-25 DIAGNOSIS — E66811 Obesity, class 1: Secondary | ICD-10-CM

## 2023-08-25 MED ORDER — VITAMIN D (ERGOCALCIFEROL) 1.25 MG (50000 UNIT) PO CAPS
50000.0000 [IU] | ORAL_CAPSULE | ORAL | 0 refills | Status: DC
Start: 2023-08-25 — End: 2023-09-29

## 2023-08-25 NOTE — Progress Notes (Signed)
SUBJECTIVE:  Chief Complaint: Obesity  Interim History: Patient feels like he did surprisingly well since last appointment.  He has only eaten at home one night.  He has been trying to stay mindful of food choices and aim to choose a nutritious option more often than not.  Over the next few weeks he mentions life looks pretty normal.  Thanksgiving he will have both sides of family at his house for Thanksgiving. 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  He makes about 20lbs of fudge and peanut butter balls and sausage balls.  Thomas Harding is here to discuss his progress with his obesity treatment plan. He is on the Category 3 Plan and states he is following his eating plan approximately 70 % of the time. He states he is working/exercises 360 minutes 7 times per week.  He can recognize he is doing a lot more activity than he did previously because he is installing cabinets.   OBJECTIVE: Visit Diagnoses: Problem List Items Addressed This Visit       Other   Prediabetes    Last A1c was 6.0.  No excessive carb intake since last appointment.  His checkup was postponed until next week- repeat labs done by PCP at that appointment.  Review labs at next appointment.       Vitamin D deficiency    Discussed importance of vitamin d supplementation.  Vitamin d supplementation has been shown to decrease fatigue, decrease risk of progression to insulin resistance and then prediabetes, decreases risk of falling in older age and can even assist in decreasing depressive symptoms in PTSD.   Prescription for Vitamin D sent in. Last Vitamin D level in May of this year and was 33.57.       Relevant Medications   Vitamin D, Ergocalciferol, (DRISDOL) 1.25 MG (50000 UNIT) CAPS capsule   Other Visit Diagnoses     Obesity with starting BMI of 32.8    -  Primary   BMI 27.0-27.9,adult           Vitals Temp: 97.7 F (36.5 C) BP:  117/67 Pulse Rate: (!) 59 SpO2: 99 %   Anthropometric Measurements Height: 5\' 11"  (1.803 m) Weight: 196 lb (88.9 kg) BMI (Calculated): 27.35 Weight at Last Visit: 198 lb Weight Lost Since Last Visit: 2 Weight Gained Since Last Visit: 0 Starting Weight: 228 lb Total Weight Loss (lbs): 32 lb (14.5 kg)   Body Composition  Body Fat %: 30.2 % Fat Mass (lbs): 59.2 lbs Muscle Mass (lbs): 130.2 lbs Total Body Water (lbs): 100.8 lbs Visceral Fat Rating : 16   Other Clinical Data Today's Visit #: 9 Starting Date: 03/13/23     ASSESSMENT AND PLAN:  Diet: Thomas Harding is currently in the action stage of change. As such, his goal is to continue with weight loss efforts. He has agreed to Category 3 Plan.  Exercise: Thomas Harding has been instructed to continue exercising as is for weight loss and overall health benefits.   Behavior Modification:  We discussed the following Behavioral Modification Strategies today: increasing lean protein intake, meal planning and cooking strategies, better snacking choices, and holiday eating strategies.   No follow-ups on file.Marland Kitchen He was informed of the importance of frequent follow up visits to maximize his success with intensive lifestyle modifications for his multiple health conditions.  Attestation Statements:   Reviewed by clinician on day of visit: allergies, medications, problem list, medical  history, surgical history, family history, social history, and previous encounter notes.    Reuben Likes, MD

## 2023-08-25 NOTE — Assessment & Plan Note (Addendum)
Last A1c was 6.0.  No excessive carb intake since last appointment.  His checkup was postponed until next week- repeat labs done by PCP at that appointment.  Review labs at next appointment.

## 2023-08-25 NOTE — Assessment & Plan Note (Signed)
Discussed importance of vitamin d supplementation.  Vitamin d supplementation has been shown to decrease fatigue, decrease risk of progression to insulin resistance and then prediabetes, decreases risk of falling in older age and can even assist in decreasing depressive symptoms in PTSD.   Prescription for Vitamin D sent in. Last Vitamin D level in May of this year and was 33.57.

## 2023-09-01 ENCOUNTER — Ambulatory Visit: Payer: Medicare Other | Admitting: Family Medicine

## 2023-09-01 VITALS — BP 124/76 | HR 82 | Temp 98.2°F | Ht 71.0 in | Wt 204.2 lb

## 2023-09-01 DIAGNOSIS — E559 Vitamin D deficiency, unspecified: Secondary | ICD-10-CM | POA: Diagnosis not present

## 2023-09-01 DIAGNOSIS — R739 Hyperglycemia, unspecified: Secondary | ICD-10-CM | POA: Diagnosis not present

## 2023-09-01 DIAGNOSIS — R252 Cramp and spasm: Secondary | ICD-10-CM | POA: Diagnosis not present

## 2023-09-01 DIAGNOSIS — K219 Gastro-esophageal reflux disease without esophagitis: Secondary | ICD-10-CM

## 2023-09-01 DIAGNOSIS — G6289 Other specified polyneuropathies: Secondary | ICD-10-CM

## 2023-09-01 DIAGNOSIS — Z23 Encounter for immunization: Secondary | ICD-10-CM

## 2023-09-01 DIAGNOSIS — R7303 Prediabetes: Secondary | ICD-10-CM | POA: Diagnosis not present

## 2023-09-01 DIAGNOSIS — I10 Essential (primary) hypertension: Secondary | ICD-10-CM

## 2023-09-01 DIAGNOSIS — E782 Mixed hyperlipidemia: Secondary | ICD-10-CM

## 2023-09-01 LAB — COMPREHENSIVE METABOLIC PANEL
ALT: 29 U/L (ref 0–53)
AST: 31 U/L (ref 0–37)
Albumin: 4.2 g/dL (ref 3.5–5.2)
Alkaline Phosphatase: 61 U/L (ref 39–117)
BUN: 26 mg/dL — ABNORMAL HIGH (ref 6–23)
CO2: 28 meq/L (ref 19–32)
Calcium: 9.5 mg/dL (ref 8.4–10.5)
Chloride: 103 meq/L (ref 96–112)
Creatinine, Ser: 0.99 mg/dL (ref 0.40–1.50)
GFR: 79.2 mL/min (ref >60.00)
Glucose, Bld: 108 mg/dL — ABNORMAL HIGH (ref 70–99)
Potassium: 4.5 meq/L (ref 3.5–5.1)
Sodium: 140 meq/L (ref 135–145)
Total Bilirubin: 1 mg/dL (ref 0.2–1.2)
Total Protein: 6.9 g/dL (ref 6.0–8.3)

## 2023-09-01 LAB — LIPID PANEL
Cholesterol: 122 mg/dL (ref 0–200)
HDL: 41.8 mg/dL (ref >39.00)
LDL Cholesterol: 65 mg/dL (ref 0–99)
NonHDL: 79.76
Total CHOL/HDL Ratio: 3
Triglycerides: 73 mg/dL (ref 0.0–149.0)
VLDL: 14.6 mg/dL (ref 0.0–40.0)

## 2023-09-01 LAB — HEMOGLOBIN A1C: Hgb A1c MFr Bld: 5.7 % (ref 4.6–6.5)

## 2023-09-01 LAB — VITAMIN D 25 HYDROXY (VIT D DEFICIENCY, FRACTURES): VITD: 71.05 ng/mL (ref 30.00–100.00)

## 2023-09-01 LAB — CK: Total CK: 370 U/L — ABNORMAL HIGH (ref 7–232)

## 2023-09-01 MED ORDER — GABAPENTIN 100 MG PO CAPS: 100.0000 mg | ORAL_CAPSULE | Freq: Every day | ORAL | 2 refills | Status: DC

## 2023-09-01 MED ORDER — AMLODIPINE BESYLATE 2.5 MG PO TABS: 2.5000 mg | ORAL_TABLET | Freq: Every day | ORAL | 1 refills | Status: DC

## 2023-09-01 MED ORDER — OMEPRAZOLE 20 MG PO CPDR: 20.0000 mg | DELAYED_RELEASE_CAPSULE | Freq: Every day | ORAL | 1 refills | Status: DC

## 2023-09-01 NOTE — Progress Notes (Unsigned)
Subjective:  Patient ID: Thomas Harding, male    DOB: [birth year]  Age: 67 y.o. MRN: [id number]  CC:  Chief Complaint  Patient presents with   Medical Management of Chronic Issues    Pt here for 6 month check in on chronic conditions     HPI Thomas Harding presents for  Follow-up of chronic issues  Lower extremity neuropathy See prior visits.  Evaluated by neurology earlier this year.  SPEP, multiple myeloma panel with IgM spike.  Chronic foot drop.  Referred to hematology to discuss prior results.  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  Note reviewed.  Discussed that that increase can be seen with inflammatory conditions or infection.  Without monoclonal component, no need for routine follow-up.  No follow-up needed if no M protein on repeat testing. Repeat testing reassuring.  Rheumatology eval September 4.  Dr. Dimple Casey.  No peripheral joint synovitis appreciated.  Other testing performed.  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 No current meds for neuropathy. No follow up with rheumatology.  Good days and bad days with foot symptoms. Notes more pain at night, not as much during day. Usually numb. Left greater than right. No recent falls.  300mg  gabapentin was too sedating in past.   Vitamin D deficiency Followed by healthy weight and wellness.  Treated with 50,000 units weekly Last vitamin D Lab Results  Component Value Date   VD25OH 33.57 02/13/2023   Prediabetes: As above, followed by healthy weight and wellness. Lab Results  Component Value Date   HGBA1C 6.0 02/13/2023   Wt Readings from Last 3 Encounters:  09/01/23 204 lb 3.2 oz (92.6 kg)  08/25/23 196 lb (88.9 kg)  08/04/23 198 lb (89.8 kg)   Hypertension: With history of PVCs treated with Toprol 50 mg daily.  Evaluated by cardiology for symptomatic PVCs previously, prior catheter ablation, Dr. Ladona Ridgel  electrophysiologist. Uses cpap for OSA (only off if congested). No recent PVC's. Amlodipine dosage decreased a month ago. Now 1/2 of 5mg .  No dyspnea, able to walk up hill recently without symptoms.  Home readings: 117/70 range.  BP Readings from Last 3 Encounters:  09/01/23 124/76  08/25/23 117/67  08/04/23 117/68   Lab Results  Component Value Date   CREATININE 1.14 03/14/2023   GERD Treated with omeprazole 20 mg daily - working well.   Hyperlipidemia: Crestor 20 mg daily.  Some soreness in L hip at times. History of lumbar spine disease. Cramps in calves at times.  Lab Results  Component Value Date   CHOL 119 02/13/2023   HDL 39.80 02/13/2023   LDLCALC 48 02/13/2023   LDLDIRECT 95.0 02/25/2022   TRIG 157.0 (H) 02/13/2023   CHOLHDL 3 02/13/2023   Lab Results  Component Value Date   ALT 20 03/14/2023   AST 24 03/14/2023   ALKPHOS 64 03/14/2023   BILITOT 1.5 (H) 03/14/2023   HM:  Flu vaccine today, colonoscopy earlier next yr.    History Patient Active Problem List   Diagnosis Date Noted   Abnormal laboratory test result 06/18/2023   Peripheral edema 06/18/2023   BMI 29.0-29.9,adult 04/24/2023   Mixed hyperlipidemia 02/16/2023   Prediabetes 02/16/2023   Left foot drop 02/16/2023   Peripheral neuropathy 02/16/2023   Vitamin D deficiency 02/16/2023   Gastroesophageal reflux disease 10/01/2022   History of colonic polyps 10/01/2022   PVC's (premature ventricular contractions) 09/12/2022  Atypical chest pain 04/26/2022   Apnea spell 04/26/2022   Fatigue 04/26/2022   Morbid obesity (HCC) 04/26/2022   Generalized obesity 04/26/2022   Elevated coronary artery calcium score 04/25/2022   Abnormal stress test 04/25/2022   Claudication (HCC) 01/31/2022   Paroxysmal atrial tachycardia (HCC) 08/02/2021   Precordial pain 06/07/2021   Bradycardia 06/22/2020   PVC (premature ventricular contraction) 06/22/2020   Spinal stenosis of lumbar region 04/20/2020   Dyspnea  11/22/2016   Past Medical History:  Diagnosis Date   High cholesterol    Hyperlipidemia    Hypertension    Joint pain    Leg pain    Neuropathy    OSA (obstructive sleep apnea)    PVC's (premature ventricular contractions)    Sleep apnea    SOB (shortness of breath)    Squamous cell carcinoma of skin 01/16/2017   in situ- right crown    Vitamin D deficiency    Past Surgical History:  Procedure Laterality Date   LEFT HEART CATH AND CORONARY ANGIOGRAPHY N/A 05/14/2022   Procedure: LEFT HEART CATH AND CORONARY ANGIOGRAPHY;  Surgeon: Elder Negus, MD;  Location: [facility];  Service: Cardiovascular;  Laterality: N/A;   LUMBAR LAMINECTOMY/DECOMPRESSION MICRODISCECTOMY Left 05/18/2020   Procedure: Microlumbar decompression Lumbar Four- Lumbar Five, Lumbar Five- Sacral One left;  Surgeon: Jene Every, MD;  Location: [facility] OR;  Service: Orthopedics;  Laterality: Left;  Microlumbar decompression Lumbar Five- Sacral One left   PVC ABLATION N/A 09/11/2022   Procedure: PVC ABLATION;  Surgeon: Marinus Maw, MD;  Location: [facility];  Service: Cardiovascular;  Laterality: N/A;   ROTATOR CUFF REPAIR Right    thumb surgery Left    TONSILLECTOMY     VASECTOMY     Allergies  Allergen Reactions   Bee Venom Anaphylaxis   Penicillins Hives and Swelling   Prior to Admission medications   Medication Sig Start Date End Date Taking? Authorizing Provider  acetaminophen (TYLENOL) 500 MG tablet Take 1,000 mg by mouth daily as needed for moderate pain.   Yes [provider]  Alpha-Lipoic Acid 600 MG CAPS Take 1,200 mg by mouth daily.   Yes [provider]  amLODipine (NORVASC) 5 MG tablet TAKE 1 TABLET BY MOUTH DAILY Patient taking differently: Take 5 mg by mouth daily. Taking 2.5mg  daily 08/13/23  Yes Marinus Maw, MD  Cholecalciferol (VITAMIN D3 PO) Take 4,000 Units by mouth daily.   Yes [provider]  metoprolol succinate (TOPROL-XL) 50 MG 24  hr tablet TAKE 1 TABLET BY MOUTH DAILY WITH OR IMMEDIATELY FOLLOWING A MEAL 06/05/23  Yes Patwardhan, Anabel Bene, MD  Misc Natural Products (TURMERIC CURCUMIN) CAPS Take by mouth. 1500 MG ONCE PER DAY   Yes [provider]  omeprazole (PRILOSEC) 20 MG capsule TAKE 1 CAPSULE BY MOUTH DAILY 07/16/23  Yes Shade Flood, MD  OVER THE COUNTER MEDICATION Take 2 capsules by mouth daily. H.A. Joint formula   Yes [provider]  rosuvastatin (CRESTOR) 20 MG tablet Take 1 tablet (20 mg total) by mouth daily. 05/13/23  Yes Patwardhan, Manish J, MD  vitamin B-12 (CYANOCOBALAMIN) 500 MCG tablet Take 500 mcg by mouth daily.   Yes [provider]  Vitamin D, Ergocalciferol, (DRISDOL) 1.25 MG (50000 UNIT) CAPS capsule Take 1 capsule (50,000 Units total) by mouth every 7 (seven) days. 08/25/23  Yes Langston Reusing, MD   Social History   Socioeconomic History   Marital status: Married  Spouse name: Not on file   Number of children: 2   Years of education: Not on file   Highest education level: Not on file  Occupational History   Occupation: Product/process development scientist   Occupation: Research scientist (medical)  Tobacco Use   Smoking status: Never    Passive exposure: Never   Smokeless tobacco: Never  Vaping Use   Vaping status: Never Used  Substance and Sexual Activity   Alcohol use: Yes    Comment: occ   Drug use: No   Sexual activity: Yes  Other Topics Concern   Not on file  Social History Narrative   Not on file   Social Determinants of Health   Financial Resource Strain: Low Risk  (11/20/2022)   Overall Financial Resource Strain (CARDIA)    Difficulty of Paying Living Expenses: Not hard at all  Food Insecurity: No Food Insecurity (03/14/2023)   Hunger Vital Sign    Worried About Running Out of Food in the Last Year: Never true    Ran Out of Food in the Last Year: Never true  Transportation Needs: No Transportation Needs (03/14/2023)   PRAPARE - Scientist, research (physical sciences) (Medical): No    Lack of Transportation (Non-Medical): No  Physical Activity: Patient Declined (11/20/2022)   Exercise Vital Sign    Days of Exercise per Week: Patient declined    Minutes of Exercise per Session: Patient declined  Stress: No Stress Concern Present (11/20/2022)   [employer] of Occupational Health - Occupational Stress Questionnaire    Feeling of Stress : Not at all  Social Connections: Socially Integrated (11/20/2022)   Social Connection and Isolation Panel [NHANES]    Frequency of Communication with Friends and Family: More than three times a week    Frequency of Social Gatherings with Friends and Family: More than three times a week    Attends Religious Services: More than 4 times per year    Active Member of [employer] or Organizations: Yes    Attends Engineer, structural: More than 4 times per year    Marital Status: Married  Catering manager Violence: Not At Risk (03/14/2023)   Humiliation, Afraid, Rape, and Kick questionnaire    Fear of Current or Ex-Partner: No    Emotionally Abused: No    Physically Abused: No    Sexually Abused: No    Review of Systems  Constitutional:  Negative for fatigue and unexpected weight change.  Eyes:  Negative for visual disturbance.  Respiratory:  Negative for cough, chest tightness and shortness of breath.   Cardiovascular:  Negative for chest pain, palpitations and leg swelling.  Gastrointestinal:  Negative for abdominal pain and blood in stool.  Neurological:  Negative for dizziness, light-headedness and headaches.     Objective:   Vitals:   09/01/23 0823  BP: 124/76  Pulse: 82  Temp: 98.2 F (36.8 C)  TempSrc: Temporal  SpO2: 94%  Weight: 204 lb 3.2 oz (92.6 kg)  Height: 5\' 11"  (1.803 m)     Physical Exam Vitals reviewed.  Constitutional:      Appearance: He is well-developed.  HENT:     Head: Normocephalic and atraumatic.  Neck:     Vascular: No carotid bruit or JVD.  Cardiovascular:      Rate and Rhythm: Normal rate and regular rhythm.     Heart sounds: Normal heart sounds. No murmur heard. Pulmonary:     Effort: Pulmonary effort is normal.     Breath sounds:  Normal breath sounds. No rales.  Musculoskeletal:     Right lower leg: No edema.     Left lower leg: No edema.  Skin:    General: Skin is warm and dry.  Neurological:     Mental Status: He is alert and oriented to person, place, and time.  Psychiatric:        Mood and Affect: Mood normal.        Assessment & Plan:  Thomas Harding is a 67 y.o. male . Mixed hyperlipidemia - Plan: Comprehensive metabolic panel, Lipid panel  Hyperglycemia - Plan: Comprehensive metabolic panel, Hemoglobin A1c  Need for influenza vaccination - Plan: Flu Vaccine Trivalent High Dose (Fluad)  Other polyneuropathy - Plan: gabapentin (NEURONTIN) 100 MG capsule  Prediabetes  Vitamin D deficiency - Plan: Vitamin D (25 hydroxy)  Essential hypertension  Muscle cramp - Plan: CK  Gastroesophageal reflux disease, unspecified whether esophagitis present - Plan: omeprazole (PRILOSEC) 20 MG capsule   Meds ordered this encounter  Medications   gabapentin (NEURONTIN) 100 MG capsule    Sig: Take 1 capsule (100 mg total) by mouth at bedtime.    Dispense:  60 capsule    Refill:  2   amLODipine (NORVASC) 2.5 MG tablet    Sig: Take 1 tablet (2.5 mg total) by mouth daily.    Dispense:  90 tablet    Refill:  1   omeprazole (PRILOSEC) 20 MG capsule    Sig: Take 1 capsule (20 mg total) by mouth daily.    Dispense:  90 capsule    Refill:  1   Patient Instructions  I would consider following back up with neuro, but ok to try low dose gabapentin (100-200mg ) in the evening to see if that helps for now.   I will check muscle test, but you have option to stop the crestor for 1-2 weeks and see if that improves cramps. If it does improve, let me know and we can discuss changes. If no change, return to daily doing and we can look at  other causes. Keep me posted.   No other med changes for today. Take care!  Return to the clinic or go to the nearest emergency room if any of your symptoms worsen or new symptoms occur.       Signed,   Meredith Staggers, MD [facility], [facility] 09/01/23 9:13 AM

## 2023-09-01 NOTE — Patient Instructions (Addendum)
I would consider following back up with neuro, but ok to try low dose gabapentin (100-200mg ) in the evening to see if that helps for now.   I will check muscle test, but you have option to stop the crestor for 1-2 weeks and see if that improves cramps. If it does improve, let me know and we can discuss changes. If no change, return to daily doing and we can look at other causes. Keep me posted.   No other med changes for today. Take care!  Return to the clinic or go to the nearest emergency room if any of your symptoms worsen or new symptoms occur.

## 2023-09-02 ENCOUNTER — Encounter: Payer: Self-pay | Admitting: Family Medicine

## 2023-09-03 DIAGNOSIS — R748 Abnormal levels of other serum enzymes: Secondary | ICD-10-CM

## 2023-09-17 ENCOUNTER — Other Ambulatory Visit (INDEPENDENT_AMBULATORY_CARE_PROVIDER_SITE_OTHER): Payer: Medicare Other

## 2023-09-17 DIAGNOSIS — G6289 Other specified polyneuropathies: Secondary | ICD-10-CM | POA: Diagnosis not present

## 2023-09-17 LAB — VITAMIN B12: Vitamin B-12: >1537 pg/mL — ABNORMAL HIGH (ref 211–911)

## 2023-09-17 NOTE — Telephone Encounter (Signed)
Pt has questions about B-12 supplement

## 2023-09-18 NOTE — Telephone Encounter (Signed)
See MyChart message to patient.  CPK was ordered for lab visit.  Please schedule lab visit.

## 2023-09-29 ENCOUNTER — Ambulatory Visit (INDEPENDENT_AMBULATORY_CARE_PROVIDER_SITE_OTHER): Payer: Medicare Other | Admitting: Family Medicine

## 2023-09-29 ENCOUNTER — Encounter (INDEPENDENT_AMBULATORY_CARE_PROVIDER_SITE_OTHER): Payer: Self-pay | Admitting: Family Medicine

## 2023-09-29 VITALS — BP 105/60 | HR 58 | Temp 98.2°F | Ht 71.0 in | Wt 197.0 lb

## 2023-09-29 DIAGNOSIS — E669 Obesity, unspecified: Secondary | ICD-10-CM | POA: Diagnosis not present

## 2023-09-29 DIAGNOSIS — Z6832 Body mass index (BMI) 32.0-32.9, adult: Secondary | ICD-10-CM

## 2023-09-29 DIAGNOSIS — Z6827 Body mass index (BMI) 27.0-27.9, adult: Secondary | ICD-10-CM | POA: Diagnosis not present

## 2023-09-29 DIAGNOSIS — E782 Mixed hyperlipidemia: Secondary | ICD-10-CM

## 2023-09-29 DIAGNOSIS — R7303 Prediabetes: Secondary | ICD-10-CM | POA: Diagnosis not present

## 2023-09-29 NOTE — Progress Notes (Signed)
   SUBJECTIVE:  Chief Complaint: Obesity  Interim History: Patient had an alright Thanksgiving.  After Thanksgiving and now he has been eating out more and less controlled with food intake.  Over the next 3-4 weeks he doesn't have much planned- no much celebration except Christmas Eve and Christmas Day.  The only thing that starts this time of year is incorporating pasta.   Thomas Harding is here to discuss his progress with his obesity treatment plan. He is on the Category 3 Plan and states he is following his eating plan approximately 85 % of the time. He states he is walking 30 minutes 4 times per week.   OBJECTIVE: Visit Diagnoses: Problem List Items Addressed This Visit       Other   Mixed hyperlipidemia - Primary   Recent labs done by PCP.  He stopped Crestor due to leg cramps but is still experiencing leg cramps off the crestor.  Recent triglyceride level now very well controlled.  HDL minimal increase to 41 from 39.  LDL still at goal.  Patient to restart Crestor.        Prediabetes   Labs from PCP reviewed- Patient recent A1c 5.7 which is decreased from 6.1.  No change to food plan.  Patient given high protein pasta options to incorporate to plan.        Vitals Temp: 98.2 F (36.8 C) BP: 105/60 Pulse Rate: (!) 58 SpO2: 96 %   Anthropometric Measurements Height: 5\' 11"  (1.803 m) Weight: 197 lb (89.4 kg) BMI (Calculated): 27.49 Weight at Last Visit: 196 lb Weight Lost Since Last Visit: 0 Weight Gained Since Last Visit: 1 Starting Weight: 228 lb Peak Weight: 31 lb   Body Composition  Body Fat %: 30.2 % Fat Mass (lbs): 59.6 lbs Muscle Mass (lbs): 131.2 lbs Total Body Water (lbs): 101.4 lbs Visceral Fat Rating : 16   Other Clinical Data Today's Visit #: 10 Starting Date: 03/13/23     ASSESSMENT AND PLAN:  Diet: Thomas Harding is currently in the action stage of change. As such, his goal is to continue with weight loss efforts. He has agreed to Category 3  Plan.  Exercise: Thomas Harding has been instructed that some exercise is better than none and to continue exercising as is for weight loss and overall health benefits.   Behavior Modification:  We discussed the following Behavioral Modification Strategies today: increasing lean protein intake, increasing vegetables, meal planning and cooking strategies, better snacking choices, and holiday eating strategies.   No follow-ups on file.Marland Kitchen He was informed of the importance of frequent follow up visits to maximize his success with intensive lifestyle modifications for his multiple health conditions.  Attestation Statements:   Reviewed by clinician on day of visit: allergies, medications, problem list, medical history, surgical history, family history, social history, and previous encounter notes.      Reuben Likes, MD

## 2023-09-29 NOTE — Assessment & Plan Note (Addendum)
Labs from PCP reviewed- Patient recent A1c 5.7 which is decreased from 6.1.  No change to food plan.  Patient given high protein pasta options to incorporate to plan.

## 2023-09-29 NOTE — Assessment & Plan Note (Signed)
Recent labs done by PCP.  He stopped Crestor due to leg cramps but is still experiencing leg cramps off the crestor.  Recent triglyceride level now very well controlled.  HDL minimal increase to 41 from 39.  LDL still at goal.  Patient to restart Crestor.

## 2023-10-06 ENCOUNTER — Other Ambulatory Visit: Payer: Self-pay | Admitting: Cardiology

## 2023-10-09 ENCOUNTER — Ambulatory Visit: Payer: Medicare Other | Admitting: Family Medicine

## 2023-10-09 ENCOUNTER — Encounter: Payer: Self-pay | Admitting: Family Medicine

## 2023-10-09 VITALS — BP 134/72 | HR 64 | Temp 97.8°F | Ht 71.0 in | Wt 209.0 lb

## 2023-10-09 DIAGNOSIS — J019 Acute sinusitis, unspecified: Secondary | ICD-10-CM

## 2023-10-09 DIAGNOSIS — E782 Mixed hyperlipidemia: Secondary | ICD-10-CM | POA: Diagnosis not present

## 2023-10-09 DIAGNOSIS — R748 Abnormal levels of other serum enzymes: Secondary | ICD-10-CM | POA: Diagnosis not present

## 2023-10-09 LAB — CK: Total CK: 117 U/L (ref 7–232)

## 2023-10-09 MED ORDER — DOXYCYCLINE HYCLATE 100 MG PO TABS: 100.0000 mg | ORAL_TABLET | Freq: Two times a day (BID) | ORAL | 0 refills | Status: DC

## 2023-10-09 NOTE — Patient Instructions (Addendum)
Doxycycline for probable sinus infection. Saline nasal spray, fluids, rest and follow up if not improved. Mucinex can be used for cough if needed. Let me know if other meds for cough if needed. Return to the clinic or go to the nearest emergency room if any of your symptoms worsen or new symptoms occur.   I will recheck the muscle enzyme test today.  You have the option to restart the Crestor initially 1 or 2 doses per week along with co-Q10 supplement over-the-counter which may help with some muscle aches.  If that this is tolerated, can increase dose to a few more days per week to find the right balance between tolerating that medication and any return of muscle aches.  If muscle aches to return or become more frequent, return to previous dose.  Either way we can recheck your cholesterol levels in about 6 weeks with a lab only visit.    Sinus Infection, Adult A sinus infection, also called sinusitis, is inflammation of your sinuses. Sinuses are hollow spaces in the bones around your face. Your sinuses are located: Around your eyes. In the middle of your forehead. Behind your nose. In your cheekbones. Mucus normally drains out of your sinuses. When your nasal tissues become inflamed or swollen, mucus can become trapped or blocked. This allows bacteria, viruses, and fungi to grow, which leads to infection. Most infections of the sinuses are caused by a virus. A sinus infection can develop quickly. It can last for up to 4 weeks (acute) or for more than 12 weeks (chronic). A sinus infection often develops after a cold. What are the causes? This condition is caused by anything that creates swelling in the sinuses or stops mucus from draining. This includes: Allergies. Asthma. Infection from bacteria or viruses. Deformities or blockages in your nose or sinuses. Abnormal growths in the nose (nasal polyps). Pollutants, such as chemicals or irritants in the air. Infection from fungi. This is  rare. What increases the risk? You are more likely to develop this condition if you: Have a weak body defense system (immune system). Do a lot of swimming or diving. Overuse nasal sprays. Smoke. What are the signs or symptoms? The main symptoms of this condition are pain and a feeling of pressure around the affected sinuses. Other symptoms include: Stuffy nose or congestion that makes it difficult to breathe through your nose. Thick yellow or greenish drainage from your nose. Tenderness, swelling, and warmth over the affected sinuses. A cough that may get worse at night. Decreased sense of smell and taste. Extra mucus that collects in the throat or the back of the nose (postnasal drip) causing a sore throat or bad breath. Tiredness (fatigue). Fever. How is this diagnosed? This condition is diagnosed based on: Your symptoms. Your medical history. A physical exam. Tests to find out if your condition is acute or chronic. This may include: Checking your nose for nasal polyps. Viewing your sinuses using a device that has a light (endoscope). Testing for allergies or bacteria. Imaging tests, such as an MRI or CT scan. In rare cases, a bone biopsy may be done to rule out more serious types of fungal sinus disease. How is this treated? Treatment for a sinus infection depends on the cause and whether your condition is chronic or acute. If caused by a virus, your symptoms should go away on their own within 10 days. You may be given medicines to relieve symptoms. They include: Medicines that shrink swollen nasal passages (decongestants). A  spray that eases inflammation of the nostrils (topical intranasal corticosteroids). Rinses that help get rid of thick mucus in your nose (nasal saline washes). Medicines that treat allergies (antihistamines). Over-the-counter pain relievers. If caused by bacteria, your health care provider may recommend waiting to see if your symptoms improve. Most  bacterial infections will get better without antibiotic medicine. You may be given antibiotics if you have: A severe infection. A weak immune system. If caused by narrow nasal passages or nasal polyps, surgery may be needed. Follow these instructions at home: Medicines Take, use, or apply over-the-counter and prescription medicines only as told by your health care provider. These may include nasal sprays. If you were prescribed an antibiotic medicine, take it as told by your health care provider. Do not stop taking the antibiotic even if you start to feel better. Hydrate and humidify  Drink enough fluid to keep your urine pale yellow. Staying hydrated will help to thin your mucus. Use a cool mist humidifier to keep the humidity level in your home above 50%. Inhale steam for 10-15 minutes, 3-4 times a day, or as told by your health care provider. You can do this in the bathroom while a hot shower is running. Limit your exposure to cool or dry air. Rest Rest as much as possible. Sleep with your head raised (elevated). Make sure you get enough sleep each night. General instructions  Apply a warm, moist washcloth to your face 3-4 times a day or as told by your health care provider. This will help with discomfort. Use nasal saline washes as often as told by your health care provider. Wash your hands often with soap and water to reduce your exposure to germs. If soap and water are not available, use hand sanitizer. Do not smoke. Avoid being around people who are smoking (secondhand smoke). Keep all follow-up visits. This is important. Contact a health care provider if: You have a fever. Your symptoms get worse. Your symptoms do not improve within 10 days. Get help right away if: You have a severe headache. You have persistent vomiting. You have severe pain or swelling around your face or eyes. You have vision problems. You develop confusion. Your neck is stiff. You have trouble  breathing. These symptoms may be an emergency. Get help right away. Call 911. Do not wait to see if the symptoms will go away. Do not drive yourself to the hospital. Summary A sinus infection is soreness and inflammation of your sinuses. Sinuses are hollow spaces in the bones around your face. This condition is caused by nasal tissues that become inflamed or swollen. The swelling traps or blocks the flow of mucus. This allows bacteria, viruses, and fungi to grow, which leads to infection. If you were prescribed an antibiotic medicine, take it as told by your health care provider. Do not stop taking the antibiotic even if you start to feel better. Keep all follow-up visits. This is important. This information is not intended to replace advice given to you by your health care provider. Make sure you discuss any questions you have with your health care provider. Document Revised: 09/04/2021 Document Reviewed: 09/04/2021 Elsevier Patient Education  2024 [employer].

## 2023-10-09 NOTE — Progress Notes (Signed)
Subjective:  Patient ID: Thomas Harding, male    DOB: October 28, 1955  Age: 67 y.o. MRN: [id number]  CC:  Chief Complaint  Patient presents with   Cough    1 month ago started with congestions, discolored phlem, dry cough, dificulty sleeping at night due to congestion which makes it difficult to breath    Medical Management of Chronic Issues    Pt still not taking statin, notes leg cramping has improved some, needs retest CK     HPI Thomas Harding presents for  Cough, sinus congestion Past 1 month with discolored phlegm, dry cough, difficulty sleeping at night with some congestion as above.  Cough is minimal compared to head congestion. Discolored nasal congestion past few weeks. No fever, face pain, tooth pain. Slight HA.  Tx: tylenol 2 per day.   Hyperlipidemia: Hyperlipidemia and plan discussed November 18.  CK level was slightly elevated at that time at 370 compared to 144 previously.  He did experience some cramps in his calves at times.  Option of temporary hold of statin for a few weeks to see if arthralgias/myalgias changes.  He has been off statin since last visit.  Plan to repeat CPK today Additionally we discussed neuropathy and started on low-dose gabapentin at bedtime, initially option of 100 mg.  Prior side effects with 300 mg dose and option of neurology follow-up.  Cramps improved off crestor - still some symptoms with certain activities - kneeling work. Sometimes at night.  Lab Results  Component Value Date   CHOL 122 09/01/2023   HDL 41.80 09/01/2023   LDLCALC 65 09/01/2023   LDLDIRECT 95.0 02/25/2022   TRIG 73.0 09/01/2023   CHOLHDL 3 09/01/2023   Lab Results  Component Value Date   ALT 29 09/01/2023   AST 31 09/01/2023   ALKPHOS 61 09/01/2023   BILITOT 1.0 09/01/2023    History Patient Active Problem List   Diagnosis Date Noted   Abnormal laboratory test result 06/18/2023   Peripheral edema 06/18/2023   BMI 29.0-29.9,adult 04/24/2023   Mixed  hyperlipidemia 02/16/2023   Prediabetes 02/16/2023   Left foot drop 02/16/2023   Peripheral neuropathy 02/16/2023   Vitamin D deficiency 02/16/2023   Gastroesophageal reflux disease 10/01/2022   History of colonic polyps 10/01/2022   PVC's (premature ventricular contractions) 09/12/2022   Atypical chest pain 04/26/2022   Apnea spell 04/26/2022   Fatigue 04/26/2022   Morbid obesity (HCC) 04/26/2022   Generalized obesity 04/26/2022   Elevated coronary artery calcium score 04/25/2022   Abnormal stress test 04/25/2022   Claudication (HCC) 01/31/2022   Paroxysmal atrial tachycardia (HCC) 08/02/2021   Precordial pain 06/07/2021   Bradycardia 06/22/2020   PVC (premature ventricular contraction) 06/22/2020   Spinal stenosis of lumbar region 04/20/2020   Dyspnea 11/22/2016   Past Medical History:  Diagnosis Date   High cholesterol    Hyperlipidemia    Hypertension    Joint pain    Leg pain    Neuropathy    OSA (obstructive sleep apnea)    PVC's (premature ventricular contractions)    Sleep apnea    SOB (shortness of breath)    Squamous cell carcinoma of skin 01/16/2017   in situ- right crown    Vitamin D deficiency    Past Surgical History:  Procedure Laterality Date   LEFT HEART CATH AND CORONARY ANGIOGRAPHY N/A 05/14/2022   Procedure: LEFT HEART CATH AND CORONARY ANGIOGRAPHY;  Surgeon: Elder Negus, MD;  Location: [facility];  Service: Cardiovascular;  Laterality: N/A;   LUMBAR LAMINECTOMY/DECOMPRESSION MICRODISCECTOMY Left 05/18/2020   Procedure: Microlumbar decompression Lumbar Four- Lumbar Five, Lumbar Five- Sacral One left;  Surgeon: Jene Every, MD;  Location: [facility] OR;  Service: Orthopedics;  Laterality: Left;  Microlumbar decompression Lumbar Five- Sacral One left   PVC ABLATION N/A 09/11/2022   Procedure: PVC ABLATION;  Surgeon: Marinus Maw, MD;  Location: [facility];  Service: Cardiovascular;  Laterality: N/A;   ROTATOR CUFF REPAIR Right     thumb surgery Left    TONSILLECTOMY     VASECTOMY     Allergies  Allergen Reactions   Bee Venom Anaphylaxis   Penicillins Hives and Swelling   Prior to Admission medications   Medication Sig Start Date End Date Taking? Authorizing Provider  acetaminophen (TYLENOL) 500 MG tablet Take 1,000 mg by mouth daily as needed for moderate pain.   Yes [provider]  Alpha-Lipoic Acid 600 MG CAPS Take 1,200 mg by mouth daily.   Yes [provider]  amLODipine (NORVASC) 2.5 MG tablet Take 1 tablet (2.5 mg total) by mouth daily. 09/01/23  Yes Shade Flood, MD  Cholecalciferol (VITAMIN D3 PO) Take 4,000 Units by mouth daily.   Yes [provider]  gabapentin (NEURONTIN) 100 MG capsule Take 1 capsule (100 mg total) by mouth at bedtime. 09/01/23  Yes Shade Flood, MD  metoprolol succinate (TOPROL-XL) 50 MG 24 hr tablet TAKE 1 TABLET BY MOUTH DAILY WITH OR IMMEDIATELY FOLLOWING A MEAL. Please call.  [phone] to schedule an appointment with Dr. Rosemary Holms for future refills. Thank you. 1st attempt. 10/06/23  Yes Patwardhan, Anabel Bene, MD  Misc Natural Products (TURMERIC CURCUMIN) CAPS Take by mouth. 1500 MG ONCE PER DAY   Yes [provider]  omeprazole (PRILOSEC) 20 MG capsule Take 1 capsule (20 mg total) by mouth daily. 09/01/23  Yes Shade Flood, MD  OVER THE COUNTER MEDICATION Take 2 capsules by mouth daily. H.A. Joint formula   Yes [provider]  rosuvastatin (CRESTOR) 20 MG tablet Take 1 tablet (20 mg total) by mouth daily. 05/13/23  Yes Patwardhan, Manish J, MD  vitamin B-12 (CYANOCOBALAMIN) 500 MCG tablet Take 500 mcg by mouth daily.   Yes [provider]   Social History   Socioeconomic History   Marital status: Married    Spouse name: Not on file   Number of children: 2   Years of education: Not on file   Highest education level: Not on file  Occupational History   Occupation: Product/process development scientist   Occupation: Advertising account executive  Tobacco Use   Smoking status: Never    Passive exposure: Never   Smokeless tobacco: Never  Vaping Use   Vaping status: Never Used  Substance and Sexual Activity   Alcohol use: Yes    Comment: occ   Drug use: No   Sexual activity: Yes  Other Topics Concern   Not on file  Social History Narrative   Not on file   Social Drivers of Health   Financial Resource Strain: Low Risk  (11/20/2022)   Overall Financial Resource Strain (CARDIA)    Difficulty of Paying Living Expenses: Not hard at all  Food Insecurity: No Food Insecurity (03/14/2023)   Hunger Vital Sign    Worried About Running Out of Food in the Last Year: Never true    Ran Out of Food in the Last Year: Never true  Transportation Needs: No Transportation Needs (03/14/2023)  PRAPARE - Administrator, Civil Service (Medical): No    Lack of Transportation (Non-Medical): No  Physical Activity: Patient Declined (11/20/2022)   Exercise Vital Sign    Days of Exercise per Week: Patient declined    Minutes of Exercise per Session: Patient declined  Stress: No Stress Concern Present (11/20/2022)   [employer] of Occupational Health - Occupational Stress Questionnaire    Feeling of Stress : Not at all  Social Connections: Socially Integrated (11/20/2022)   Social Connection and Isolation Panel [NHANES]    Frequency of Communication with Friends and Family: More than three times a week    Frequency of Social Gatherings with Friends and Family: More than three times a week    Attends Religious Services: More than 4 times per year    Active Member of [employer] or Organizations: Yes    Attends Banker Meetings: More than 4 times per year    Marital Status: Married  Catering manager Violence: Not At Risk (03/14/2023)   Humiliation, Afraid, Rape, and Kick questionnaire    Fear of Current or Ex-Partner: No    Emotionally Abused: No    Physically Abused: No    Sexually Abused: No    Review of  Systems Per HPI.   Objective:   Vitals:   10/09/23 0951  BP: 134/72  Pulse: 64  Temp: 97.8 F (36.6 C)  TempSrc: Temporal  SpO2: 100%  Weight: 209 lb (94.8 kg)  Height: 5\' 11"  (1.803 m)     Physical Exam Vitals reviewed.  Constitutional:      Appearance: He is well-developed.  HENT:     Head: Normocephalic and atraumatic.     Right Ear: Tympanic membrane, ear canal and external ear normal.     Left Ear: Tympanic membrane, ear canal and external ear normal.     Nose: No rhinorrhea.     Comments: No frontal or maxillary sinus tenderness.    Mouth/Throat:     Pharynx: No oropharyngeal exudate or posterior oropharyngeal erythema.  Eyes:     Conjunctiva/sclera: Conjunctivae normal.     Pupils: Pupils are equal, round, and reactive to light.  Neck:     Vascular: No carotid bruit or JVD.  Cardiovascular:     Rate and Rhythm: Normal rate and regular rhythm.     Heart sounds: Normal heart sounds. No murmur heard. Pulmonary:     Effort: Pulmonary effort is normal.     Breath sounds: Normal breath sounds. No wheezing, rhonchi or rales.  Abdominal:     Palpations: Abdomen is soft.     Tenderness: There is no abdominal tenderness.  Musculoskeletal:     Cervical back: Neck supple.     Right lower leg: No edema.     Left lower leg: No edema.  Lymphadenopathy:     Cervical: No cervical adenopathy.  Skin:    General: Skin is warm and dry.     Findings: No rash.  Neurological:     Mental Status: He is alert and oriented to person, place, and time.  Psychiatric:        Mood and Affect: Mood normal.        Behavior: Behavior normal.        Assessment & Plan:  Thomas Harding is a 67 y.o. male . Mixed hyperlipidemia - Plan: CK, Lipid panel, Comprehensive metabolic panel, CK Elevated CPK - Plan: CK, CK  -Repeat CPK, some improvement of arthralgias/myalgias off statin but intermittent  cramps indicate not 100 percent from statin.  Option to restart Crestor initially once  per week or twice per week with co-Q10 supplement then increase frequency as tolerated.  6-week lab visit for repeat lipids and adjust plan accordingly at that time.  RTC precautions if new or worsening symptoms.  Acute sinusitis, recurrence not specified, unspecified location - Plan: doxycycline (VIBRA-TABS) 100 MG tablet  -Possible ethmoid/sphenoid and is nontender on exam.  Persistent discolored nasal discharge and pressure, slight headaches, likely bacterial sinusitis, subacute.  He does note that he has had some congestion at times previously and some difficulty with smell.  Option of saline nasal spray, trial over-the-counter Flonase with RTC precautions.   Meds ordered this encounter  Medications   doxycycline (VIBRA-TABS) 100 MG tablet    Sig: Take 1 tablet (100 mg total) by mouth 2 (two) times daily.    Dispense:  20 tablet    Refill:  0   Patient Instructions  Doxycycline for probable sinus infection. Saline nasal spray, fluids, rest and follow up if not improved. Mucinex can be used for cough if needed. Let me know if other meds for cough if needed. Return to the clinic or go to the nearest emergency room if any of your symptoms worsen or new symptoms occur.   I will recheck the muscle enzyme test today.  You have the option to restart the Crestor initially 1 or 2 doses per week along with co-Q10 supplement over-the-counter which may help with some muscle aches.  If that this is tolerated, can increase dose to a few more days per week to find the right balance between tolerating that medication and any return of muscle aches.  If muscle aches to return or become more frequent, return to previous dose.  Either way we can recheck your cholesterol levels in about 6 weeks with a lab only visit.    Sinus Infection, Adult A sinus infection, also called sinusitis, is inflammation of your sinuses. Sinuses are hollow spaces in the bones around your face. Your sinuses are located: Around your  eyes. In the middle of your forehead. Behind your nose. In your cheekbones. Mucus normally drains out of your sinuses. When your nasal tissues become inflamed or swollen, mucus can become trapped or blocked. This allows bacteria, viruses, and fungi to grow, which leads to infection. Most infections of the sinuses are caused by a virus. A sinus infection can develop quickly. It can last for up to 4 weeks (acute) or for more than 12 weeks (chronic). A sinus infection often develops after a cold. What are the causes? This condition is caused by anything that creates swelling in the sinuses or stops mucus from draining. This includes: Allergies. Asthma. Infection from bacteria or viruses. Deformities or blockages in your nose or sinuses. Abnormal growths in the nose (nasal polyps). Pollutants, such as chemicals or irritants in the air. Infection from fungi. This is rare. What increases the risk? You are more likely to develop this condition if you: Have a weak body defense system (immune system). Do a lot of swimming or diving. Overuse nasal sprays. Smoke. What are the signs or symptoms? The main symptoms of this condition are pain and a feeling of pressure around the affected sinuses. Other symptoms include: Stuffy nose or congestion that makes it difficult to breathe through your nose. Thick yellow or greenish drainage from your nose. Tenderness, swelling, and warmth over the affected sinuses. A cough that may get worse at night. Decreased sense  of smell and taste. Extra mucus that collects in the throat or the back of the nose (postnasal drip) causing a sore throat or bad breath. Tiredness (fatigue). Fever. How is this diagnosed? This condition is diagnosed based on: Your symptoms. Your medical history. A physical exam. Tests to find out if your condition is acute or chronic. This may include: Checking your nose for nasal polyps. Viewing your sinuses using a device that has a  light (endoscope). Testing for allergies or bacteria. Imaging tests, such as an MRI or CT scan. In rare cases, a bone biopsy may be done to rule out more serious types of fungal sinus disease. How is this treated? Treatment for a sinus infection depends on the cause and whether your condition is chronic or acute. If caused by a virus, your symptoms should go away on their own within 10 days. You may be given medicines to relieve symptoms. They include: Medicines that shrink swollen nasal passages (decongestants). A spray that eases inflammation of the nostrils (topical intranasal corticosteroids). Rinses that help get rid of thick mucus in your nose (nasal saline washes). Medicines that treat allergies (antihistamines). Over-the-counter pain relievers. If caused by bacteria, your health care provider may recommend waiting to see if your symptoms improve. Most bacterial infections will get better without antibiotic medicine. You may be given antibiotics if you have: A severe infection. A weak immune system. If caused by narrow nasal passages or nasal polyps, surgery may be needed. Follow these instructions at home: Medicines Take, use, or apply over-the-counter and prescription medicines only as told by your health care provider. These may include nasal sprays. If you were prescribed an antibiotic medicine, take it as told by your health care provider. Do not stop taking the antibiotic even if you start to feel better. Hydrate and humidify  Drink enough fluid to keep your urine pale yellow. Staying hydrated will help to thin your mucus. Use a cool mist humidifier to keep the humidity level in your home above 50%. Inhale steam for 10-15 minutes, 3-4 times a day, or as told by your health care provider. You can do this in the bathroom while a hot shower is running. Limit your exposure to cool or dry air. Rest Rest as much as possible. Sleep with your head raised (elevated). Make sure you  get enough sleep each night. General instructions  Apply a warm, moist washcloth to your face 3-4 times a day or as told by your health care provider. This will help with discomfort. Use nasal saline washes as often as told by your health care provider. Wash your hands often with soap and water to reduce your exposure to germs. If soap and water are not available, use hand sanitizer. Do not smoke. Avoid being around people who are smoking (secondhand smoke). Keep all follow-up visits. This is important. Contact a health care provider if: You have a fever. Your symptoms get worse. Your symptoms do not improve within 10 days. Get help right away if: You have a severe headache. You have persistent vomiting. You have severe pain or swelling around your face or eyes. You have vision problems. You develop confusion. Your neck is stiff. You have trouble breathing. These symptoms may be an emergency. Get help right away. Call 911. Do not wait to see if the symptoms will go away. Do not drive yourself to the hospital. Summary A sinus infection is soreness and inflammation of your sinuses. Sinuses are hollow spaces in the bones around your  face. This condition is caused by nasal tissues that become inflamed or swollen. The swelling traps or blocks the flow of mucus. This allows bacteria, viruses, and fungi to grow, which leads to infection. If you were prescribed an antibiotic medicine, take it as told by your health care provider. Do not stop taking the antibiotic even if you start to feel better. Keep all follow-up visits. This is important. This information is not intended to replace advice given to you by your health care provider. Make sure you discuss any questions you have with your health care provider. Document Revised: 09/04/2021 Document Reviewed: 09/04/2021 Elsevier Patient Education  2024 Elsevier Inc.      Signed,   Meredith Staggers, MD [facility], [facility] 10/09/23 10:37 AM

## 2023-10-19 DIAGNOSIS — I493 Ventricular premature depolarization: Secondary | ICD-10-CM | POA: Diagnosis not present

## 2023-10-21 ENCOUNTER — Ambulatory Visit: Payer: Medicare Other | Attending: Internal Medicine

## 2023-10-21 ENCOUNTER — Other Ambulatory Visit: Payer: Self-pay

## 2023-10-21 DIAGNOSIS — I493 Ventricular premature depolarization: Secondary | ICD-10-CM

## 2023-10-21 NOTE — Progress Notes (Unsigned)
 Enrolled patient for a 3 day Zio XT monitor to be mailed to patients home

## 2023-10-22 ENCOUNTER — Other Ambulatory Visit (INDEPENDENT_AMBULATORY_CARE_PROVIDER_SITE_OTHER): Payer: Self-pay | Admitting: Family Medicine

## 2023-10-22 DIAGNOSIS — E559 Vitamin D deficiency, unspecified: Secondary | ICD-10-CM

## 2023-10-29 ENCOUNTER — Other Ambulatory Visit: Payer: Self-pay | Admitting: Cardiology

## 2023-10-31 DIAGNOSIS — I493 Ventricular premature depolarization: Secondary | ICD-10-CM | POA: Diagnosis not present

## 2023-11-01 ENCOUNTER — Other Ambulatory Visit: Payer: Self-pay | Admitting: Cardiology

## 2023-11-03 MED ORDER — METOPROLOL SUCCINATE ER 50 MG PO TB24: ORAL_TABLET | ORAL | 0 refills | Status: DC

## 2023-11-03 NOTE — Addendum Note (Signed)
Addended by: Elwin Mocha on: 11/03/2023 12:41 PM   Modules accepted: Orders

## 2023-11-06 ENCOUNTER — Encounter (INDEPENDENT_AMBULATORY_CARE_PROVIDER_SITE_OTHER): Payer: Self-pay | Admitting: Family Medicine

## 2023-11-06 ENCOUNTER — Ambulatory Visit (INDEPENDENT_AMBULATORY_CARE_PROVIDER_SITE_OTHER): Payer: Medicare Other | Admitting: Family Medicine

## 2023-11-06 VITALS — BP 120/64 | HR 53 | Temp 97.8°F | Ht 71.0 in | Wt 203.0 lb

## 2023-11-06 DIAGNOSIS — Z6831 Body mass index (BMI) 31.0-31.9, adult: Secondary | ICD-10-CM

## 2023-11-06 DIAGNOSIS — I493 Ventricular premature depolarization: Secondary | ICD-10-CM

## 2023-11-06 DIAGNOSIS — Z6828 Body mass index (BMI) 28.0-28.9, adult: Secondary | ICD-10-CM

## 2023-11-06 DIAGNOSIS — E66811 Obesity, class 1: Secondary | ICD-10-CM | POA: Diagnosis not present

## 2023-11-06 NOTE — Assessment & Plan Note (Signed)
Recent assessment showing significant improvement in PVC burden upon review.  He is feeling good overall.  Realizes that he has been slightly more indulgent in his food intake over the holidays.

## 2023-11-06 NOTE — Progress Notes (Signed)
SUBJECTIVE:  Chief Complaint: Obesity  Interim History: Patient had a very busy holiday season.  He mentions it was tiring with events and activities.  Foodwise he has been doing quite a bit of eating out.  Breakfast and lunch has been clockwork.  Dinner is often eating out.  Around 3-4pm he is needing an energy bar.  Dinners have varied from spaghetti, enchiladas, stir fry.  Next 6 weeks looks fairly normal in terms of routine for him.   Chistian is here to discuss his progress with his obesity treatment plan. He is on the Category 3 Plan and states he is following his eating plan approximately 50 % of the time. He states he is exercising 45 minutes 4 times per week.   OBJECTIVE: Visit Diagnoses: Problem List Items Addressed This Visit       Cardiovascular and Mediastinum   PVC's (premature ventricular contractions) - Primary   Recent assessment showing significant improvement in PVC burden upon review.  He is feeling good overall.  Realizes that he has been slightly more indulgent in his food intake over the holidays.        Other   Class 1 obesity with body mass index (BMI) of 31.0 to 31.9 in adult   Patient started program Mar 13, 2023.  Weight at that time was 228 pounds with a BMI of 31.  He has stayed consistent on his meal plan since that time.  Current weight is 203 pounds.  He does report being slightly more indulgent with his food intake over the holidays and less strict and getting all of his protein in.  He has been consistent with his physical activity. we will follow-up in 6 weeks for patient's obesity treatment and management.      Other Visit Diagnoses       Obesity with starting BMI of 32.8         BMI 28.0-28.9,adult           No data recorded No data recorded No data recorded No data recorded    11/06/2023    2:00 PM 10/09/2023    9:51 AM 09/29/2023    2:00 PM  Vitals with BMI  Height 5\' 11"  5\' 11"  5\' 11"   Weight 203 lbs 209 lbs 197 lbs  BMI 28.33  29.16 27.49  Systolic 120 134 161  Diastolic 64 72 60  Pulse 53 64 58     ASSESSMENT AND PLAN:  Diet: Aziah is currently in the action stage of change. As such, his goal is to continue with weight loss efforts. He has agreed to Category 3 Plan.  Exercise: Osiris has been instructed to work up to a goal of 150 minutes of combined cardio and strengthening exercise per week and that some exercise is better than none for weight loss and overall health benefits.  Discussed various resistance training exercise combinations to help with increasing intensity  without increasing time.    Behavior Modification:  We discussed the following Behavioral Modification Strategies today: increasing lean protein intake, increasing vegetables, meal planning and cooking strategies, and better snacking choices. No medications at this time.  No follow-ups on file.Marland Kitchen He was informed of the importance of frequent follow up visits to maximize his success with intensive lifestyle modifications for his multiple health conditions.  Attestation Statements:   Reviewed by clinician on day of visit: allergies, medications, problem list, medical history, surgical history, family history, social history, and previous encounter notes.      [place]  Lawson Radar, MD

## 2023-11-12 NOTE — Assessment & Plan Note (Signed)
Patient started program Mar 13, 2023.  Weight at that time was 228 pounds with a BMI of 31.  He has stayed consistent on his meal plan since that time.  Current weight is 203 pounds.  He does report being slightly more indulgent with his food intake over the holidays and less strict and getting all of his protein in.  He has been consistent with his physical activity. we will follow-up in 6 weeks for patient's obesity treatment and management.

## 2023-11-16 ENCOUNTER — Other Ambulatory Visit: Payer: Self-pay | Admitting: Cardiology

## 2023-11-20 ENCOUNTER — Other Ambulatory Visit (INDEPENDENT_AMBULATORY_CARE_PROVIDER_SITE_OTHER): Payer: Medicare Other

## 2023-11-20 DIAGNOSIS — E782 Mixed hyperlipidemia: Secondary | ICD-10-CM | POA: Diagnosis not present

## 2023-11-20 LAB — LIPID PANEL
Cholesterol: 123 mg/dL (ref 0–200)
HDL: 37.2 mg/dL — ABNORMAL LOW (ref >39.00)
LDL Cholesterol: 58 mg/dL (ref 0–99)
NonHDL: 85.65
Total CHOL/HDL Ratio: 3
Triglycerides: 137 mg/dL (ref 0.0–149.0)
VLDL: 27.4 mg/dL (ref 0.0–40.0)

## 2023-11-20 LAB — COMPREHENSIVE METABOLIC PANEL
ALT: 20 U/L (ref 0–53)
AST: 21 U/L (ref 0–37)
Albumin: 4 g/dL (ref 3.5–5.2)
Alkaline Phosphatase: 70 U/L (ref 39–117)
BUN: 35 mg/dL — ABNORMAL HIGH (ref 6–23)
CO2: 29 meq/L (ref 19–32)
Calcium: 9.1 mg/dL (ref 8.4–10.5)
Chloride: 103 meq/L (ref 96–112)
Creatinine, Ser: 0.78 mg/dL (ref 0.40–1.50)
GFR: 92.58 mL/min (ref >60.00)
Glucose, Bld: 96 mg/dL (ref 70–99)
Potassium: 4 meq/L (ref 3.5–5.1)
Sodium: 141 meq/L (ref 135–145)
Total Bilirubin: 0.6 mg/dL (ref 0.2–1.2)
Total Protein: 6.9 g/dL (ref 6.0–8.3)

## 2023-11-23 ENCOUNTER — Encounter: Payer: Self-pay | Admitting: Family Medicine

## 2023-11-23 NOTE — Progress Notes (Signed)
  Electrophysiology Office Note:   Date:  11/25/2023  ID:  Thomas Harding, DOB [birth year], MRN [id number]  Primary Cardiologist: None Primary Heart Failure: None Electrophysiologist: Lewayne Bunting, MD      History of Present Illness:   Thomas Harding is a 68 y.o. 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   Since last being seen in our clinic the patient reports doing very well. He has been working on weight loss and has lost a significant amt of weight - approx 20+ lbs. He remains active, continues to work as a Proofreader.  He feels his energy level has returned to normal. Uses a [employer] if he feels he has PVC's  He denies chest pain, palpitations, dyspnea, PND, orthopnea, nausea, vomiting, dizziness, syncope, edema, weight gain, or early satiety.   Review of systems complete and found to be negative unless listed in HPI.   EP Information / Studies Reviewed:    EKG is not ordered today. EKG from 1/ reviewed which showed NSR 63 bpm with occ PVC      Studies:  ECHO 06/2020 > LVEF 64% LHC 05/2022 > no angiographic evidence of CAD Cardiac Monitor 05/2022 > 24% PVC burden   EPS 09/11/22 > RFA of frequent PVC's originating from left coronary cusp Cardiac Monitor 08/2023 > PVC burden reduced to 13% Cardiac Monitor 10/2023 > rare PVC's <1%   Arrhythmia / AAD PVC's > s/p ablation of left coronary cusp, initially 24% burden, decreased post ablation to ~ 13%      Physical Exam:   VS:  BP 132/72   Pulse 60   Ht 5\' 11"  (1.803 m)   Wt 213 lb (96.6 kg)   SpO2 97%   BMI 29.71 kg/m    Wt Readings from Last 3 Encounters:  11/25/23 213 lb (96.6 kg)  11/06/23 203 lb (92.1 kg)  10/09/23 209 lb (94.8 kg)     GEN: Well nourished, well developed in no acute distress NECK: No JVD; No carotid bruits CARDIAC: Regular rate and rhythm, no murmurs, rubs,  gallops RESPIRATORY:  Clear to auscultation without rales, wheezing or rhonchi  ABDOMEN: Soft, non-tender, non-distended EXTREMITIES:  No edema; No deformity   ASSESSMENT AND PLAN:    PVC's  S/p ablation, recent monitor with reduced burden <1%.  ECHO 2021 with normal LV function. -continue Toprol XL 50 mg daily  -asymptomatic   -recent monitor with <1% burden reassuring  -monitors with [employer]   Hypertension  Negative LHC, previously normal LVEF  -well controlled on current regimen   -follows with Dr. Rosemary Holms, arrange for follow up / re-connect with Cardiology  OSA  -CPAP compliance encouraged, pt uses daily unless sinus issues   Follow up with EP APP  9 months    Signed, Canary Brim, NP-C, AGACNP-BC [facility] - Electrophysiology  11/25/2023, 10:57 AM

## 2023-11-24 ENCOUNTER — Ambulatory Visit: Payer: Medicare Other | Admitting: Student

## 2023-11-25 ENCOUNTER — Encounter: Payer: Self-pay | Admitting: Pulmonary Disease

## 2023-11-25 ENCOUNTER — Ambulatory Visit: Payer: Medicare Other | Attending: Pulmonary Disease | Admitting: Pulmonary Disease

## 2023-11-25 VITALS — BP 132/72 | HR 60 | Ht 71.0 in | Wt 213.0 lb

## 2023-11-25 DIAGNOSIS — I493 Ventricular premature depolarization: Secondary | ICD-10-CM

## 2023-11-25 NOTE — Patient Instructions (Addendum)
Medication Instructions:  Your physician recommends that you continue on your current medications as directed. Please refer to the Current Medication list given to you today.  *If you need a refill on your cardiac medications before your next appointment, please call your pharmacy*  Lab Work: None ordered If you have labs (blood work) drawn today and your tests are completely normal, you will receive your results only by: MyChart Message (if you have MyChart) OR A paper copy in the mail If you have any lab test that is abnormal or we need to change your treatment, we will call you to review the results.  Follow-Up: At [facility], [place], you and your health needs are our priority.  As part of our continuing mission to provide you with exceptional heart care, we have created designated Provider Care Teams.  These Care Teams include your primary Cardiologist (physician) and Advanced Practice Providers (APPs -  Physician Assistants and Nurse Practitioners) who all work together to provide you with the care you need, when you need it.  Your next appointment:   9 month(s)  Provider:   Canary Brim, NP    Schedule next available with Dr Rosemary Holms

## 2023-11-30 ENCOUNTER — Other Ambulatory Visit: Payer: Self-pay | Admitting: Cardiology

## 2023-12-09 DIAGNOSIS — Z860100 Personal history of colon polyps, unspecified: Secondary | ICD-10-CM | POA: Diagnosis not present

## 2023-12-09 DIAGNOSIS — K219 Gastro-esophageal reflux disease without esophagitis: Secondary | ICD-10-CM | POA: Diagnosis not present

## 2023-12-09 DIAGNOSIS — Z1211 Encounter for screening for malignant neoplasm of colon: Secondary | ICD-10-CM | POA: Diagnosis not present

## 2023-12-09 DIAGNOSIS — E782 Mixed hyperlipidemia: Secondary | ICD-10-CM | POA: Diagnosis not present

## 2023-12-09 DIAGNOSIS — I10 Essential (primary) hypertension: Secondary | ICD-10-CM | POA: Diagnosis not present

## 2023-12-16 DIAGNOSIS — M25512 Pain in left shoulder: Secondary | ICD-10-CM | POA: Diagnosis not present

## 2023-12-16 DIAGNOSIS — M5417 Radiculopathy, lumbosacral region: Secondary | ICD-10-CM | POA: Diagnosis not present

## 2023-12-16 DIAGNOSIS — M25562 Pain in left knee: Secondary | ICD-10-CM | POA: Diagnosis not present

## 2023-12-16 DIAGNOSIS — M5459 Other low back pain: Secondary | ICD-10-CM | POA: Diagnosis not present

## 2023-12-27 ENCOUNTER — Other Ambulatory Visit: Payer: Self-pay | Admitting: Cardiology

## 2024-01-01 ENCOUNTER — Ambulatory Visit (INDEPENDENT_AMBULATORY_CARE_PROVIDER_SITE_OTHER): Payer: Medicare Other | Admitting: Family Medicine

## 2024-01-01 ENCOUNTER — Encounter (INDEPENDENT_AMBULATORY_CARE_PROVIDER_SITE_OTHER): Payer: Self-pay | Admitting: Family Medicine

## 2024-01-01 VITALS — BP 137/77 | HR 60 | Temp 97.8°F | Ht 71.0 in | Wt 203.0 lb

## 2024-01-01 DIAGNOSIS — E66811 Obesity, class 1: Secondary | ICD-10-CM | POA: Diagnosis not present

## 2024-01-01 DIAGNOSIS — G4730 Sleep apnea, unspecified: Secondary | ICD-10-CM

## 2024-01-01 DIAGNOSIS — R7303 Prediabetes: Secondary | ICD-10-CM | POA: Diagnosis not present

## 2024-01-01 DIAGNOSIS — Z6828 Body mass index (BMI) 28.0-28.9, adult: Secondary | ICD-10-CM

## 2024-01-01 DIAGNOSIS — G4733 Obstructive sleep apnea (adult) (pediatric): Secondary | ICD-10-CM

## 2024-01-01 DIAGNOSIS — I493 Ventricular premature depolarization: Secondary | ICD-10-CM

## 2024-01-01 DIAGNOSIS — Z6831 Body mass index (BMI) 31.0-31.9, adult: Secondary | ICD-10-CM

## 2024-01-01 NOTE — Assessment & Plan Note (Signed)
 study demonstrates moderate to severe obstructive sleep apnea, with a total AHI of 26.5/hour, REM AHI of 45.3/hour, supine AHI of 8.6/hour and O2 nadir of 82%.  Patient has been consistently wearing CPAP since that test.  Follow up on symptoms at next appointment.

## 2024-01-01 NOTE — Assessment & Plan Note (Signed)
 Last A1c 5.7 on last lab check.  He has eaten out a bit more recently than he did previously.  Will repeat labs with PCP at next appt.

## 2024-01-01 NOTE — Progress Notes (Signed)
   SUBJECTIVE:  Chief Complaint: Obesity  Interim History: Patient here for 6 week follow up.   Thomas Harding is here to discuss his progress with his obesity treatment plan. He is on the Category 3 Plan and states he is following his eating plan approximately 80 % of the time. He states he is exercising 30 minutes for 6 days a week.  OBJECTIVE: Visit Diagnoses: Problem List Items Addressed This Visit       Cardiovascular and Mediastinum   PVC's (premature ventricular contractions)   Pt seeing cardiology in next few weeks and he mentions he is hopeful to get off metoprolol        Respiratory   Sleep apnea - Primary   study demonstrates moderate to severe obstructive sleep apnea, with a total AHI of 26.5/hour, REM AHI of 45.3/hour, supine AHI of 8.6/hour and O2 nadir of 82%.  Patient has been consistently wearing CPAP since that test.  Follow up on symptoms at next appointment.         Other   Class 1 obesity with body mass index (BMI) of 31.0 to 31.9 in adult   Patient to stay committed to Category 3 with goal to add more consistent resistance training in.      Prediabetes   Last A1c 5.7 on last lab check.  He has eaten out a bit more recently than he did previously.  Will repeat labs with PCP at next appt.      Other Visit Diagnoses       BMI 28.0-28.9,adult           Vitals Temp: 97.8 F (36.6 C) BP: 137/77 Pulse Rate: 60 SpO2: 97 %   Anthropometric Measurements Height: 5\' 11"  (1.803 m) Weight: 203 lb (92.1 kg) BMI (Calculated): 28.33 Weight at Last Visit: 203 lb Weight Lost Since Last Visit: 0 Weight Gained Since Last Visit: 0 Starting Weight: 228 lb Total Weight Loss (lbs): 25 lb (11.3 kg)   Body Composition  Body Fat %: 31.4 % Fat Mass (lbs): 63.8 lbs Muscle Mass (lbs): 132.6 lbs Total Body Water (lbs): 103.8 lbs Visceral Fat Rating : 17   Other Clinical Data Today's Visit #: 12 Starting Date: 03/13/23     ASSESSMENT AND  PLAN:  Diet: Thomas Harding is currently in the action stage of change. As such, his goal is to continue with weight loss efforts and has agreed to the Category 3 Plan.   Exercise:  Older adults with chronic conditions should understand whether and how their conditions affect their ability to do regular physical activity safely.  Behavior Modification:  We discussed the following Behavioral Modification Strategies today: increasing lean protein intake, increasing vegetables, decreasing eating out, avoiding temptations, and planning for success.   Return in about 6 weeks (around 02/12/2024).Marland Kitchen He was informed of the importance of frequent follow up visits to maximize his success with intensive lifestyle modifications for his multiple health conditions.  Attestation Statements:   Reviewed by clinician on day of visit: allergies, medications, problem list, medical history, surgical history, family history, social history, and previous encounter notes.     Reuben Likes, MD

## 2024-01-01 NOTE — Assessment & Plan Note (Signed)
 Patient to stay committed to Category 3 with goal to add more consistent resistance training in.

## 2024-01-01 NOTE — Assessment & Plan Note (Signed)
 Pt seeing cardiology in next few weeks and he mentions he is hopeful to get off metoprolol

## 2024-01-06 ENCOUNTER — Encounter: Payer: Self-pay | Admitting: Cardiology

## 2024-01-06 ENCOUNTER — Ambulatory Visit: Payer: Medicare Other | Attending: Cardiology | Admitting: Cardiology

## 2024-01-06 VITALS — BP 118/60 | HR 52 | Ht 71.0 in | Wt 213.2 lb

## 2024-01-06 DIAGNOSIS — R931 Abnormal findings on diagnostic imaging of heart and coronary circulation: Secondary | ICD-10-CM | POA: Diagnosis not present

## 2024-01-06 DIAGNOSIS — I493 Ventricular premature depolarization: Secondary | ICD-10-CM

## 2024-01-06 MED ORDER — DILTIAZEM HCL ER COATED BEADS 120 MG PO CP24: 120.0000 mg | ORAL_CAPSULE | Freq: Every day | ORAL | 3 refills | Status: AC

## 2024-01-06 NOTE — Patient Instructions (Signed)
 Medication Instructions:  STOP Amlodipine STOP Metoprolol   START Cardizem CD 120 MG Take one tablet by mouth daily    *If you need a refill on your cardiac medications before your next appointment, please call your pharmacy*   Follow-Up: At [facility], you and your health needs are our priority.  As part of our continuing mission to provide you with exceptional heart care, we have created designated Provider Care Teams.  These Care Teams include your primary Cardiologist (physician) and Advanced Practice Providers (APPs -  Physician Assistants and Nurse Practitioners) who all work together to provide you with the care you need, when you need it.   Your next appointment:   6 month(s)  Provider:   Elder Negus, MD     Other Instructions   1st Floor: - Lobby - Registration  - Pharmacy  - Lab - Cafe  2nd Floor: - PV Lab - Diagnostic Testing (echo, CT, nuclear med)  3rd Floor: - Vacant  4th Floor: - TCTS (cardiothoracic surgery) - AFib Clinic - Structural Heart Clinic - Vascular Surgery  - Vascular Ultrasound  5th Floor: - HeartCare Cardiology (general and EP) - Clinical Pharmacy for coumadin, hypertension, lipid, weight-loss medications, and med management appointments    Valet parking services will be available as well.

## 2024-01-06 NOTE — Progress Notes (Signed)
 Cardiology Office Note:  .   Date:  01/06/2024  ID:  Thomas Harding, DOB November 20, 1955, MRN [id number] PCP: Shade Flood, MD  [facility] Providers Cardiologist:  Truett Mainland, MD PCP: Shade Flood, MD  Chief Complaint  Patient presents with   PVC's      History of Present Illness: .    Thomas Harding is a 69 y.o. male with symptomatic PVCs s/p ablation, elevated calcium score  Patient is doing well.  He had PVCs post ablation, but have subsequently settled down to <1% on recent monitor in 10/2023.  He has noticed tiredness and fatigue symptoms by 4 PM, question whether this is due to metoprolol.     Vitals:   01/06/24 0806  BP: 118/60  Pulse: (!) 52  SpO2: 96%     ROS:  Review of Systems  Constitutional: Positive for malaise/fatigue.  Cardiovascular:  Negative for chest pain, dyspnea on exertion, leg swelling, palpitations and syncope.        Studies Reviewed: Marland Kitchen        EKG 01/06/2024: Sinus bradycardia When compared with ECG of 11-Sep-2022 14:02, QT has shortened    Independently interpreted 11/2023: Chol 123, TG 137, HDL 37, LDL 58 HbA1C 5.7% Hb 14.0 Cr 0.78 TSH 1.7  Zio monitor 10/2023: NSR with sinus brady (47/min) and sinus tachycardia (143/min), ave 64/min. Rare and brief episodes of NS SVT (longest 7 beats, fastest 148/min) No VT or sustained SVT or atrial fib. Rare (<1%) PVC's or PAC's. No prolonged pauses No symptoms.  Patch Wear Time:  6 days and 23 hours (2025-01-05T12:11:00-0500 to 2025-01-12T11:43:11-498)  CT cardiac scoring 03/2022: Left Main: 0 LAD: 54 LCx: 2.21 RCA: 13.1   Total Agatston Score: 69.3 MESA database percentile: 49  Scattered aortic atherosclerosis    Physical Exam:   Physical Exam Vitals and nursing note reviewed.  Constitutional:      General: He is not in acute distress. Neck:     Vascular: No JVD.  Cardiovascular:     Rate and Rhythm: Normal rate and regular rhythm.     Heart  sounds: Normal heart sounds. No murmur heard. Pulmonary:     Effort: Pulmonary effort is normal.     Breath sounds: Normal breath sounds. No wheezing or rales.  Musculoskeletal:     Right lower leg: No edema.     Left lower leg: No edema.      VISIT DIAGNOSES:   ICD-10-CM   1. PVC's (premature ventricular contractions)  I49.3 EKG 12-Lead    2. Elevated coronary artery calcium score  R93.1        ASSESSMENT AND PLAN: .    Thomas Harding is a 68 y.o. male with symptomatic PVCs s/p ablation, elevated calcium score  PVCs: <1% on monitor in 10/2023, s/p ablation in 2023. Short episodes of NSVT noted. Given his fatigue symptoms on metoprolol, I will stop his metoprolol succinate 50 mg daily, and start diltiazem CD 120 mg daily. With initiation of diltiazem, I am stopping his amlodipine 2.5 mg daily.  I think his blood pressure will be well-controlled with diltiazem 120 mg daily alone.  Elevated coronary calcium score: Lipids very well-controlled on Crestor 20 mg daily.   Meds ordered this encounter  Medications   diltiazem (CARDIZEM CD) 120 MG 24 hr capsule    Sig: Take 1 capsule (120 mg total) by mouth daily.    Dispense:  90 capsule    Refill:  3  F/u in 6 months  Signed, Elder Negus, MD

## 2024-01-29 ENCOUNTER — Ambulatory Visit: Payer: Medicare Other | Admitting: Neurology

## 2024-02-16 ENCOUNTER — Encounter (INDEPENDENT_AMBULATORY_CARE_PROVIDER_SITE_OTHER): Payer: Self-pay | Admitting: Family Medicine

## 2024-02-16 ENCOUNTER — Ambulatory Visit (INDEPENDENT_AMBULATORY_CARE_PROVIDER_SITE_OTHER): Admitting: Family Medicine

## 2024-02-16 VITALS — BP 124/70 | HR 59 | Temp 98.0°F | Ht 71.0 in | Wt 200.0 lb

## 2024-02-16 DIAGNOSIS — E65 Localized adiposity: Secondary | ICD-10-CM | POA: Insufficient documentation

## 2024-02-16 DIAGNOSIS — Z6827 Body mass index (BMI) 27.0-27.9, adult: Secondary | ICD-10-CM | POA: Diagnosis not present

## 2024-02-16 DIAGNOSIS — E66811 Obesity, class 1: Secondary | ICD-10-CM

## 2024-02-16 DIAGNOSIS — E669 Obesity, unspecified: Secondary | ICD-10-CM | POA: Diagnosis not present

## 2024-02-16 NOTE — Assessment & Plan Note (Signed)
 Visceral fat rating at 17.  Goal is less than 10.  Patient is working on Psychologist, occupational to engage muscle mass to ultimately decrease fat mass.

## 2024-02-16 NOTE — Progress Notes (Signed)
   SUBJECTIVE:  Chief Complaint: Obesity  Interim History: Patient had a weekend long wedding this past weekend and mentions that he prior to that followed the meal plan fairly strictly.  He has done some upper resistance training in attempt to improve his golf game.  He also has been adding in about an inch in his thighs and  half inch in his calves. Over the next few weeks he is thinking food intake will get easier.  Patient does not anticipate any upcoming issues with the food choices over the next few weeks.  Jessica is here to discuss his progress with his obesity treatment plan. He is on the Category 3 Plan and states he is following his eating plan approximately 90 % of the time. He states he is exercising for 45 minutes at the gym and  playing golf 60 minutes 3-5 times per week.   OBJECTIVE: Visit Diagnoses: Problem List Items Addressed This Visit       Other   Central adiposity - Primary   Visceral fat rating at 17.  Goal is less than 10.  Patient is working on Psychologist, occupational to engage muscle mass to ultimately decrease fat mass.      Other Visit Diagnoses       Obesity with starting BMI of 32.8         BMI 27.0-27.9,adult           Vitals Temp: 98 F (36.7 C) BP: 124/70 Pulse Rate: (!) 59 SpO2: 98 %   Anthropometric Measurements Height: 5\' 11"  (1.803 m) Weight: 200 lb (90.7 kg) BMI (Calculated): 27.91 Weight at Last Visit: 203lb Weight Lost Since Last Visit: 3lb Starting Weight: 228lb Total Weight Loss (lbs): 28 lb (12.7 kg)   Body Composition  Body Fat %: 31.2 % Fat Mass (lbs): 62.6 lbs Muscle Mass (lbs): 131 lbs Total Body Water (lbs): 102.4 lbs Visceral Fat Rating : 17   Other Clinical Data Fasting: no Labs: no Today's Visit #: 13 Starting Date: 03/13/23     ASSESSMENT AND PLAN:  Diet: Qusai is currently in the action stage of change. As such, his goal is to continue with weight loss efforts and has agreed to the  Category 3 Plan.   Exercise:  Older adults should follow the adult guidelines. When older adults cannot meet the adult guidelines, they should be as physically active as their abilities and conditions will allow. and Older adults should determine their level of effort for physical activity relative to their level of fitness.  Behavior Modification:  We discussed the following Behavioral Modification Strategies today: increasing lean protein intake, decreasing simple carbohydrates, meal planning and cooking strategies, keeping healthy foods in the home, avoiding temptations, and planning for success.   Return in about 6 weeks (around 03/29/2024).   He was informed of the importance of frequent follow up visits to maximize his success with intensive lifestyle modifications for his multiple health conditions.  Attestation Statements:   Reviewed by clinician on day of visit: allergies, medications, problem list, medical history, surgical history, family history, social history, and previous encounter notes.     Donaciano Frizzle, MD

## 2024-02-18 DIAGNOSIS — Z1211 Encounter for screening for malignant neoplasm of colon: Secondary | ICD-10-CM | POA: Diagnosis not present

## 2024-02-18 DIAGNOSIS — K5730 Diverticulosis of large intestine without perforation or abscess without bleeding: Secondary | ICD-10-CM | POA: Diagnosis not present

## 2024-02-18 DIAGNOSIS — Z860100 Personal history of colon polyps, unspecified: Secondary | ICD-10-CM | POA: Diagnosis not present

## 2024-02-18 DIAGNOSIS — K648 Other hemorrhoids: Secondary | ICD-10-CM | POA: Diagnosis not present

## 2024-02-18 DIAGNOSIS — Z860101 Personal history of adenomatous and serrated colon polyps: Secondary | ICD-10-CM | POA: Diagnosis not present

## 2024-02-19 LAB — HEMOGLOBIN A1C
Hemoglobin A1C: 5.3
Hemoglobin A1C: 5.7

## 2024-02-26 ENCOUNTER — Telehealth: Payer: Self-pay

## 2024-02-26 ENCOUNTER — Other Ambulatory Visit: Payer: Self-pay | Admitting: Family Medicine

## 2024-02-26 DIAGNOSIS — G6289 Other specified polyneuropathies: Secondary | ICD-10-CM

## 2024-02-26 NOTE — Telephone Encounter (Signed)
 Copied from CRM [id number]. Topic: Clinical - Medication Question >> Feb 26, 2024  1:15 PM Baldo Levan wrote: Reason for CRM: Patient is calling in to call back the nurse and confirmed he is not taking the Amlodipine  anymore as that does not need to be refilled.

## 2024-02-26 NOTE — Telephone Encounter (Signed)
 Please advise, reviewed last few notes and could not determine when this was stopped in addition to multiple prescribers for this medication.

## 2024-02-26 NOTE — Telephone Encounter (Signed)
 Chart reviewed, note from cardiology on 01/06/2024, he was started on diltiazem  CD 120 mg at that time and therefore  asked to stop amlodipine  (both are calcium  channel blockers, just different formulations) and stopped metoprolol .  It looks like both amlodipine  and metoprolol  are not on his active medication list anymore.

## 2024-02-27 NOTE — Telephone Encounter (Signed)
 Patient called back and confirmed he is not taking the amlodipine  so the refill request from [facility] should not be filled.

## 2024-02-27 NOTE — Telephone Encounter (Signed)
Called pt left vm to call office

## 2024-03-02 DIAGNOSIS — K5730 Diverticulosis of large intestine without perforation or abscess without bleeding: Secondary | ICD-10-CM | POA: Insufficient documentation

## 2024-03-02 DIAGNOSIS — I10 Essential (primary) hypertension: Secondary | ICD-10-CM | POA: Insufficient documentation

## 2024-03-03 ENCOUNTER — Encounter: Payer: Self-pay | Admitting: Family Medicine

## 2024-03-03 ENCOUNTER — Ambulatory Visit (INDEPENDENT_AMBULATORY_CARE_PROVIDER_SITE_OTHER): Payer: Medicare Other | Admitting: Family Medicine

## 2024-03-03 VITALS — BP 122/70 | HR 59 | Temp 98.4°F | Ht 70.25 in | Wt 207.2 lb

## 2024-03-03 DIAGNOSIS — Z6832 Body mass index (BMI) 32.0-32.9, adult: Secondary | ICD-10-CM | POA: Diagnosis not present

## 2024-03-03 DIAGNOSIS — Z0000 Encounter for general adult medical examination without abnormal findings: Secondary | ICD-10-CM

## 2024-03-03 DIAGNOSIS — Z125 Encounter for screening for malignant neoplasm of prostate: Secondary | ICD-10-CM

## 2024-03-03 DIAGNOSIS — G6289 Other specified polyneuropathies: Secondary | ICD-10-CM | POA: Diagnosis not present

## 2024-03-03 DIAGNOSIS — I493 Ventricular premature depolarization: Secondary | ICD-10-CM

## 2024-03-03 DIAGNOSIS — K219 Gastro-esophageal reflux disease without esophagitis: Secondary | ICD-10-CM | POA: Diagnosis not present

## 2024-03-03 DIAGNOSIS — E66811 Obesity, class 1: Secondary | ICD-10-CM | POA: Diagnosis not present

## 2024-03-03 DIAGNOSIS — R7989 Other specified abnormal findings of blood chemistry: Secondary | ICD-10-CM

## 2024-03-03 LAB — VITAMIN B12: Vitamin B-12: >1500 pg/mL — ABNORMAL HIGH (ref 211–911)

## 2024-03-03 LAB — PSA: PSA: 1.38 ng/mL (ref 0.10–4.00)

## 2024-03-03 MED ORDER — OMEPRAZOLE 20 MG PO CPDR: 20.0000 mg | DELAYED_RELEASE_CAPSULE | Freq: Every day | ORAL | 1 refills | Status: AC

## 2024-03-03 NOTE — Patient Instructions (Addendum)
 Thanks for coming in today.  I will check the B12 and PSA levels, other labs have been checked in the past few months and with recent A1c level I held off on that as well today.  Keep up the good work.  Keep follow-up with specialist as planned.  No med changes from me today.  I will see you in 6 months but let me know if there are questions sooner.  Take care!   Health Maintenance After Age 67 After age 1, you are at a higher risk for certain long-term diseases and infections as well as injuries from falls. Falls are a major cause of broken bones and head injuries in people who are older than age 109. Getting regular preventive care can help to keep you healthy and well. Preventive care includes getting regular testing and making lifestyle changes as recommended by your health care provider. Talk with your health care provider about: Which screenings and tests you should have. A screening is a test that checks for a disease when you have no symptoms. A diet and exercise plan that is right for you. What should I know about screenings and tests to prevent falls? Screening and testing are the best ways to find a health problem early. Early diagnosis and treatment give you the best chance of managing medical conditions that are common after age 33. Certain conditions and lifestyle choices may make you more likely to have a fall. Your health care provider may recommend: Regular vision checks. Poor vision and conditions such as cataracts can make you more likely to have a fall. If you wear glasses, make sure to get your prescription updated if your vision changes. Medicine review. Work with your health care provider to regularly review all of the medicines you are taking, including over-the-counter medicines. Ask your health care provider about any side effects that may make you more likely to have a fall. Tell your health care provider if any medicines that you take make you feel dizzy or sleepy. Strength and  balance checks. Your health care provider may recommend certain tests to check your strength and balance while standing, walking, or changing positions. Foot health exam. Foot pain and numbness, as well as not wearing proper footwear, can make you more likely to have a fall. Screenings, including: Osteoporosis screening. Osteoporosis is a condition that causes the bones to get weaker and break more easily. Blood pressure screening. Blood pressure changes and medicines to control blood pressure can make you feel dizzy. Depression screening. You may be more likely to have a fall if you have a fear of falling, feel depressed, or feel unable to do activities that you used to do. Alcohol use screening. Using too much alcohol can affect your balance and may make you more likely to have a fall. Follow these instructions at home: Lifestyle Do not drink alcohol if: Your health care provider tells you not to drink. If you drink alcohol: Limit how much you have to: 0-1 drink a day for women. 0-2 drinks a day for men. Know how much alcohol is in your drink. In the [place] one drink equals one 12 oz bottle of beer (355 mL), one 5 oz glass of wine (148 mL), or one 1 oz glass of hard liquor (44 mL). Do not use any products that contain nicotine or tobacco. These products include cigarettes, chewing tobacco, and vaping devices, such as e-cigarettes. If you need help quitting, ask your health care provider. Activity  Follow a  regular exercise program to stay fit. This will help you maintain your balance. Ask your health care provider what types of exercise are appropriate for you. If you need a cane or walker, use it as recommended by your health care provider. Wear supportive shoes that have nonskid soles. Safety  Remove any tripping hazards, such as rugs, cords, and clutter. Install safety equipment such as grab bars in bathrooms and safety rails on stairs. Keep rooms and walkways well-lit. General  instructions Talk with your health care provider about your risks for falling. Tell your health care provider if: You fall. Be sure to tell your health care provider about all falls, even ones that seem minor. You feel dizzy, tiredness (fatigue), or off-balance. Take over-the-counter and prescription medicines only as told by your health care provider. These include supplements. Eat a healthy diet and maintain a healthy weight. A healthy diet includes low-fat dairy products, low-fat (lean) meats, and fiber from whole grains, beans, and lots of fruits and vegetables. Stay current with your vaccines. Schedule regular health, dental, and eye exams. Summary Having a healthy lifestyle and getting preventive care can help to protect your health and wellness after age 72. Screening and testing are the best way to find a health problem early and help you avoid having a fall. Early diagnosis and treatment give you the best chance for managing medical conditions that are more common for people who are older than age 26. Falls are a major cause of broken bones and head injuries in people who are older than age 80. Take precautions to prevent a fall at home. Work with your health care provider to learn what changes you can make to improve your health and wellness and to prevent falls. This information is not intended to replace advice given to you by your health care provider. Make sure you discuss any questions you have with your health care provider. Document Revised: 02/19/2021 Document Reviewed: 02/19/2021 Elsevier Patient Education  2024 [employer].

## 2024-03-03 NOTE — Progress Notes (Signed)
 Subjective:  Patient ID: Thomas Harding, male    DOB: 22-Dec-1955  Age: 68 y.o. MRN: [id number]  CC:  Chief Complaint  Patient presents with   Annual Exam    HPI Thomas Harding presents for Annual Exam  PCP, me Weight management, Dr.Ukleja, class I obesity, starting BMI 32.8, central adiposity. Gastroenterology, Dr. Tova Fresh Cardiology, Dr. Filiberto Hug, PVCs, elevated coronary calcium  score.  Appointment March 25.  Electrophysiology Dr. Carolynne Citron, PVC's. ventricular premature depolarization.  History of ablation in 2023.  Fatigue on metoprolol , switched to diltiazem  CD 120 mg daily in March.  Amlodipine  was discontinued along with metoprolol .  75-month follow-up with cardiology planned for March. Tolerating current regimen. Feels about the same.  Ortho, Dr. Leighton Punches, low back pain, left shoulder pain, lumbosacral radiculopathy, appointment March 4. Dermatology, Dr. Del Favia, melanocytic nevi, actinic keratoses, seborrheic keratoses.  Appointment July 2024 Hematology, Dr. 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  Testing indicated no evidence of monoclonal gammopathy in blood or urine test.  Without evidence of paraproteinemia, no further recommended hematologic workup.  Over-the-counter vitamin B12 recommended for borderline levels and follow-up as needed. Sleep specialist, Dr. Omar Bibber, OSA on CPAP  Still using gabapentin  for neuropathic sx's in feet. Left worse. Gabapentin  100mg  at bedtime, no side effects. Tight muscles at times in feet.  Taking B12 supplement.   Lab Results  Component Value Date   VITAMINB12 >1537 (H) 09/17/2023  Has cut back since last reading - 2 per day.   GERD: Stable with daily PPI.  Vit D normal in 08/2023. B12 as above.   Cardiac As above, on diltiazem  in place of metoprolol  and amlodipine  was discontinued. Lipids in 11/2023.  BP Readings from Last 3 Encounters:  03/03/24 122/70  02/16/24 124/70  01/06/24 118/60    Hyperlipidemia: Elevated coronary calcium  scoring, lipids were controlled on last testing in February with LDL 58, continued on Crestor  20 mg every other day.  Lab Results  Component Value Date   CHOL 123 11/20/2023   HDL 37.20 (L) 11/20/2023   LDLCALC 58 11/20/2023   LDLDIRECT 95.0 02/25/2022   TRIG 137.0 11/20/2023   CHOLHDL 3 11/20/2023   Lab Results  Component Value Date   ALT 20 11/20/2023   AST 21 11/20/2023   ALKPHOS 70 11/20/2023   BILITOT 0.6 11/20/2023   Home visit recently - A1c 5.3, BP 138/78 on 02/19/24.       03/03/2024    8:51 AM 10/09/2023    9:49 AM 09/01/2023    8:18 AM 02/13/2023    8:06 AM 11/20/2022   11:44 AM  Depression screen PHQ 2/9  Decreased Interest 0 0 0 0 0  Down, Depressed, Hopeless 0 0 0 0 0  PHQ - 2 Score 0 0 0 0 0  Altered sleeping  1 0 0 0  Tired, decreased energy  1 0 0 0  Change in appetite  0 0 1 0  Feeling bad or failure about yourself   0 0 0 0  Trouble concentrating  0 0 0 0  Moving slowly or fidgety/restless  0 0 0 0  Suicidal thoughts  0 0 0 0  PHQ-9 Score  2 0 1 0    Health Maintenance  Topic Date Due   Hepatitis C Screening  Never done   Medicare Annual Wellness (AWV)  11/21/2023   COVID-19 Vaccine (3 - Moderna risk series) 03/19/2024 (Originally 09/18/2020)   Zoster Vaccines- Shingrix (1 of 2) 06/03/2024 (Originally 11/02/1975)  INFLUENZA VACCINE  05/14/2024   DTaP/Tdap/Td (2 - Td or Tdap) 03/16/2027   Colonoscopy  10/13/2028   Pneumonia Vaccine 39+ Years old  Completed   HPV VACCINES  Aged Out   Meningococcal B Vaccine  Aged Out  Colonoscopy - Dr. Tova Fresh, 02/18/24.  Prostate: does not have family history of prostate cancer The natural history of prostate cancer and ongoing controversy regarding screening and potential treatment outcomes of prostate cancer has been discussed with the patient. The meaning of a false positive PSA and a false negative PSA has been discussed. He indicates understanding of the limitations of  this screening test and wishes to proceed with screening PSA testing. Lab Results  Component Value Date   PSA1 1.7 07/21/2020   PSA1 1.1 06/24/2018   PSA 1.09 02/15/2021      Immunization History  Administered Date(s) Administered   Fluad Quad(high Dose 65+) 07/31/2022   Fluad Trivalent(High Dose 65+) 09/01/2023   Influenza,inj,Quad PF,6+ Mos 06/24/2018, 07/26/2020, 08/20/2021   Moderna SARS-COV2 Booster Vaccination 08/21/2020   Moderna Sars-Covid-2 Vaccination 12/11/2019, 01/08/2020   PNEUMOCOCCAL CONJUGATE-20 07/31/2022   Tdap 03/15/2017  Flu vaccine in fall. Declines covid booster - possible association with PVC's.  Plans on shingles vaccine this week.   No results found. Optho - visit scheduled in June.   Dental: today at 1pm.   Alcohol: 1 per month.   Tobacco:  None  Exercise: gym 3-4 times per week, golf sim 4/week, walking.    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   Essential hypertension 03/02/2024   Central adiposity 02/16/2024   Abnormal laboratory test result 06/18/2023   Peripheral edema 06/18/2023   BMI 29.0-29.9,adult 04/24/2023   Mixed hyperlipidemia 02/16/2023   Prediabetes 02/16/2023   Left foot drop 02/16/2023   Peripheral neuropathy 02/16/2023   Vitamin D  deficiency 02/16/2023   Gastroesophageal reflux disease 10/01/2022   History of colonic polyps 10/01/2022   PVC's (premature ventricular contractions) 09/12/2022   Atypical chest pain 04/26/2022   Sleep apnea 04/26/2022   Fatigue 04/26/2022   Class 1 obesity with body mass index (BMI) of 31.0 to 31.9 in adult 04/26/2022   Generalized obesity 04/26/2022   Elevated coronary artery calcium  score 04/25/2022   Abnormal stress test 04/25/2022   Claudication (HCC) 01/31/2022   Paroxysmal atrial tachycardia (HCC) 08/02/2021   Precordial pain 06/07/2021   Bradycardia 06/22/2020   PVC (premature ventricular contraction) 06/22/2020    Spinal stenosis of lumbar region 04/20/2020   Dyspnea 11/22/2016   Past Medical History:  Diagnosis Date   High cholesterol    Hyperlipidemia    Hypertension    Joint pain    Leg pain    Neuropathy    OSA (obstructive sleep apnea)    PVC's (premature ventricular contractions)    Sleep apnea    SOB (shortness of breath)    Squamous cell carcinoma of skin 01/16/2017   in situ- right crown    Vitamin D  deficiency    Past Surgical History:  Procedure Laterality Date   LEFT HEART CATH AND CORONARY ANGIOGRAPHY N/A 05/14/2022   Procedure: LEFT HEART CATH AND CORONARY ANGIOGRAPHY;  Surgeon: Cody Das, MD;  Location: [facility] INVASIVE [facility];  Service: Cardiovascular;  Laterality: N/A;   LUMBAR LAMINECTOMY/DECOMPRESSION MICRODISCECTOMY Left 05/18/2020   Procedure: Microlumbar decompression Lumbar Four- Lumbar Five, Lumbar Five- Sacral One left;  Surgeon: Orvan Blanch, MD;  Location: [facility] OR;  Service: Orthopedics;  Laterality:  Left;  Microlumbar decompression Lumbar Five- Sacral One left   PVC ABLATION N/A 09/11/2022   Procedure: PVC ABLATION;  Surgeon: Tammie Fall, MD;  Location: [facility] INV[facility];  Service: Cardiovascular;  Laterality: N/A;   ROTATOR CUFF REPAIR Right    thumb surgery Left    TONSILLECTOMY     VASECTOMY     Allergies  Allergen Reactions   Bee Venom Anaphylaxis   Penicillins Hives and Swelling   Prior to Admission medications   Medication Sig Start Date End Date Taking? Authorizing Provider  acetaminophen  (TYLENOL ) 500 MG tablet Take 1,000 mg by mouth daily as needed for moderate pain.   Yes [provider]  Alpha-Lipoic Acid 600 MG CAPS Take 1,200 mg by mouth daily.   Yes [provider]  Cholecalciferol (VITAMIN D3 PO) Take 4,000 Units by mouth daily.   Yes [provider]  diltiazem  (CARDIZEM  CD) 120 MG 24 hr capsule Take 1 capsule (120 mg total) by mouth daily. 01/06/24  Yes Patwardhan, Manish J, MD  gabapentin  (NEURONTIN ) 100  MG capsule TAKE 1 CAPSULE BY MOUTH AT BEDTIME 02/26/24  Yes Benjiman Bras, MD  Misc Natural Products (TURMERIC CURCUMIN) CAPS Take by mouth. 1500 MG ONCE PER DAY   Yes [provider]  omeprazole  (PRILOSEC ) 20 MG capsule Take 1 capsule (20 mg total) by mouth daily. 09/01/23  Yes Benjiman Bras, MD  OVER THE COUNTER MEDICATION Take 2 capsules by mouth daily. H.A. Joint formula   Yes [provider]  rosuvastatin  (CRESTOR ) 20 MG tablet Take 1 tablet (20 mg total) by mouth daily. 05/13/23  Yes Patwardhan, Manish J, MD  vitamin B-12 (CYANOCOBALAMIN ) 500 MCG tablet Take 500 mcg by mouth daily.   Yes [provider]   Social History   Socioeconomic History   Marital status: Married    Spouse name: Not on file   Number of children: 2   Years of education: Not on file   Highest education level: Not on file  Occupational History   Occupation: Product/process development scientist   Occupation: Research scientist (medical)  Tobacco Use   Smoking status: Never    Passive exposure: Never   Smokeless tobacco: Never  Vaping Use   Vaping status: Never Used  Substance and Sexual Activity   Alcohol use: Yes    Comment: occ   Drug use: No   Sexual activity: Yes  Other Topics Concern   Not on file  Social History Narrative   Not on file   Social Drivers of Health   Financial Resource Strain: Low Risk  (11/20/2022)   Overall Financial Resource Strain (CARDIA)    Difficulty of Paying Living Expenses: Not hard at all  Food Insecurity: No Food Insecurity (03/14/2023)   Hunger Vital Sign    Worried About Running Out of Food in the Last Year: Never true    Ran Out of Food in the Last Year: Never true  Transportation Needs: No Transportation Needs (03/14/2023)   PRAPARE - Administrator, Civil Service (Medical): No    Lack of Transportation (Non-Medical): No  Physical Activity: Patient Declined (11/20/2022)   Exercise Vital Sign    Days of Exercise per Week: Patient declined     Minutes of Exercise per Session: Patient declined  Stress: No Stress Concern Present (11/20/2022)   [employer] of Occupational Health - Occupational Stress Questionnaire    Feeling of Stress : Not at all  Social Connections: Socially Integrated (11/20/2022)  Social Advertising account executive [NHANES]    Frequency of Communication with Friends and Family: More than three times a week    Frequency of Social Gatherings with Friends and Family: More than three times a week    Attends Religious Services: More than 4 times per year    Active Member of [employer] or Organizations: Yes    Attends Banker Meetings: More than 4 times per year    Marital Status: Married  Catering manager Violence: Not At Risk (03/14/2023)   Humiliation, Afraid, Rape, and Kick questionnaire    Fear of Current or Ex-Partner: No    Emotionally Abused: No    Physically Abused: No    Sexually Abused: No    Review of Systems 13 point review of systems per patient health survey noted.  Negative other than as indicated above or in HPI.    Objective:   Vitals:   03/03/24 0854  BP: 122/70  Pulse: (!) 59  Temp: 98.4 F (36.9 C)  TempSrc: Oral  SpO2: 95%  Weight: 207 lb 3.2 oz (94 kg)  Height: 5' 10.25" (1.784 m)     Physical Exam Vitals reviewed.  Constitutional:      Appearance: He is well-developed.  HENT:     Head: Normocephalic and atraumatic.     Right Ear: External ear normal.     Left Ear: External ear normal.  Eyes:     Conjunctiva/sclera: Conjunctivae normal.     Pupils: Pupils are equal, round, and reactive to light.  Neck:     Thyroid : No thyromegaly.  Cardiovascular:     Rate and Rhythm: Normal rate and regular rhythm.     Heart sounds: Normal heart sounds.  Pulmonary:     Effort: Pulmonary effort is normal. No respiratory distress.     Breath sounds: Normal breath sounds. No wheezing.  Abdominal:     General: There is no distension.     Palpations: Abdomen is soft.      Tenderness: There is no abdominal tenderness.  Musculoskeletal:        General: No tenderness. Normal range of motion.     Cervical back: Normal range of motion and neck supple.  Lymphadenopathy:     Cervical: No cervical adenopathy.  Skin:    General: Skin is warm and dry.  Neurological:     Mental Status: He is alert and oriented to person, place, and time.     Deep Tendon Reflexes: Reflexes are normal and symmetric.  Psychiatric:        Behavior: Behavior normal.        Assessment & Plan:  Thomas Harding is a 68 y.o. male . Annual physical exam   -anticipatory guidance as below in AVS, screening labs above. Health maintenance items as above in HPI discussed/recommended as applicable.   Class 1 obesity with serious comorbidity and body mass index (BMI) of 32.0 to 32.9 in adult, unspecified obesity type  - Commended on improvements including most recent A1c that is significantly improved.  Continue follow-up with weight management.  PVC's (premature ventricular contractions)  - Stable, tolerating diltiazem  in place of amlodipine , metoprolol .  Continue follow-up as planned with cardiology.  Other polyneuropathy - Plan: B12 Elevated vitamin B12 level  - Has cut back on B12 supplementation but still taking supplement, and with chronic PPI use this would be reasonable.  Check updated level.  Continue gabapentin  for neuropathy, overall stable.  Screening for prostate cancer - Plan: PSA  Gastroesophageal reflux disease, unspecified whether esophagitis present - Plan: omeprazole  (PRILOSEC ) 20 MG capsule  - Stable with PPI, recent vitamin D  noted as well as B12 levels above.  Continue same regimen.  Noted some recurrent sinus congestion, plans on trying over-the-counter Flonase  and follow-up if not improving.  Meds ordered this encounter  Medications   omeprazole  (PRILOSEC ) 20 MG capsule    Sig: Take 1 capsule (20 mg total) by mouth daily.    Dispense:  90 capsule    Refill:   1   Patient Instructions  Thanks for coming in today.  I will check the B12 and PSA levels, other labs have been checked in the past few months and with recent A1c level I held off on that as well today.  Keep up the good work.  Keep follow-up with specialist as planned.  No med changes from me today.  I will see you in 6 months but let me know if there are questions sooner.  Take care!   Health Maintenance After Age 69 After age 60, you are at a higher risk for certain long-term diseases and infections as well as injuries from falls. Falls are a major cause of broken bones and head injuries in people who are older than age 52. Getting regular preventive care can help to keep you healthy and well. Preventive care includes getting regular testing and making lifestyle changes as recommended by your health care provider. Talk with your health care provider about: Which screenings and tests you should have. A screening is a test that checks for a disease when you have no symptoms. A diet and exercise plan that is right for you. What should I know about screenings and tests to prevent falls? Screening and testing are the best ways to find a health problem early. Early diagnosis and treatment give you the best chance of managing medical conditions that are common after age 23. Certain conditions and lifestyle choices may make you more likely to have a fall. Your health care provider may recommend: Regular vision checks. Poor vision and conditions such as cataracts can make you more likely to have a fall. If you wear glasses, make sure to get your prescription updated if your vision changes. Medicine review. Work with your health care provider to regularly review all of the medicines you are taking, including over-the-counter medicines. Ask your health care provider about any side effects that may make you more likely to have a fall. Tell your health care provider if any medicines that you take make you feel  dizzy or sleepy. Strength and balance checks. Your health care provider may recommend certain tests to check your strength and balance while standing, walking, or changing positions. Foot health exam. Foot pain and numbness, as well as not wearing proper footwear, can make you more likely to have a fall. Screenings, including: Osteoporosis screening. Osteoporosis is a condition that causes the bones to get weaker and break more easily. Blood pressure screening. Blood pressure changes and medicines to control blood pressure can make you feel dizzy. Depression screening. You may be more likely to have a fall if you have a fear of falling, feel depressed, or feel unable to do activities that you used to do. Alcohol use screening. Using too much alcohol can affect your balance and may make you more likely to have a fall. Follow these instructions at home: Lifestyle Do not drink alcohol if: Your health care provider tells you not to drink. If you drink  alcohol: Limit how much you have to: 0-1 drink a day for women. 0-2 drinks a day for men. Know how much alcohol is in your drink. In the [place] one drink equals one 12 oz bottle of beer (355 mL), one 5 oz glass of wine (148 mL), or one 1 oz glass of hard liquor (44 mL). Do not use any products that contain nicotine or tobacco. These products include cigarettes, chewing tobacco, and vaping devices, such as e-cigarettes. If you need help quitting, ask your health care provider. Activity  Follow a regular exercise program to stay fit. This will help you maintain your balance. Ask your health care provider what types of exercise are appropriate for you. If you need a cane or walker, use it as recommended by your health care provider. Wear supportive shoes that have nonskid soles. Safety  Remove any tripping hazards, such as rugs, cords, and clutter. Install safety equipment such as grab bars in bathrooms and safety rails on stairs. Keep rooms and  walkways well-lit. General instructions Talk with your health care provider about your risks for falling. Tell your health care provider if: You fall. Be sure to tell your health care provider about all falls, even ones that seem minor. You feel dizzy, tiredness (fatigue), or off-balance. Take over-the-counter and prescription medicines only as told by your health care provider. These include supplements. Eat a healthy diet and maintain a healthy weight. A healthy diet includes low-fat dairy products, low-fat (lean) meats, and fiber from whole grains, beans, and lots of fruits and vegetables. Stay current with your vaccines. Schedule regular health, dental, and eye exams. Summary Having a healthy lifestyle and getting preventive care can help to protect your health and wellness after age 11. Screening and testing are the best way to find a health problem early and help you avoid having a fall. Early diagnosis and treatment give you the best chance for managing medical conditions that are more common for people who are older than age 59. Falls are a major cause of broken bones and head injuries in people who are older than age 63. Take precautions to prevent a fall at home. Work with your health care provider to learn what changes you can make to improve your health and wellness and to prevent falls. This information is not intended to replace advice given to you by your health care provider. Make sure you discuss any questions you have with your health care provider. Document Revised: 02/19/2021 Document Reviewed: 02/19/2021 Elsevier Patient Education  2024 Elsevier Inc.    Signed,   Caro Christmas, MD [facility], [facility] 03/03/24 9:48 AM

## 2024-03-09 ENCOUNTER — Ambulatory Visit: Payer: Self-pay

## 2024-03-09 ENCOUNTER — Ambulatory Visit: Payer: Self-pay | Admitting: Family Medicine

## 2024-03-23 DIAGNOSIS — H5203 Hypermetropia, bilateral: Secondary | ICD-10-CM | POA: Diagnosis not present

## 2024-03-23 DIAGNOSIS — H2513 Age-related nuclear cataract, bilateral: Secondary | ICD-10-CM | POA: Diagnosis not present

## 2024-03-31 ENCOUNTER — Ambulatory Visit (INDEPENDENT_AMBULATORY_CARE_PROVIDER_SITE_OTHER): Admitting: Family Medicine

## 2024-03-31 ENCOUNTER — Encounter (INDEPENDENT_AMBULATORY_CARE_PROVIDER_SITE_OTHER): Payer: Self-pay | Admitting: Family Medicine

## 2024-03-31 VITALS — BP 114/68 | HR 65 | Temp 98.2°F | Ht 71.0 in | Wt 202.0 lb

## 2024-03-31 DIAGNOSIS — E782 Mixed hyperlipidemia: Secondary | ICD-10-CM | POA: Diagnosis not present

## 2024-03-31 DIAGNOSIS — Z6828 Body mass index (BMI) 28.0-28.9, adult: Secondary | ICD-10-CM

## 2024-03-31 DIAGNOSIS — E66811 Obesity, class 1: Secondary | ICD-10-CM

## 2024-03-31 NOTE — Progress Notes (Unsigned)
   SUBJECTIVE:  Chief Complaint: Obesity  Interim History: Patient mentions that he has been vacationing at his beach house in [place].  He mentions he has had his grandsons there last week.  Over the last 6 weeks he has slightly more indulgent than he anticipated- eating some desserts, sauces on some of the foods he is eating.  Breakfast and lunch is on plan.  Over the next 8 weeks he is anticipating a bit of travel to [place] for a week otherwise will be home.  Doesn't feel like he is starving himself.  There are some days he will not eat everything at a meal time but will get it in at some point.  He is getting all his protein in.  Nehal is here to discuss his progress with his obesity treatment plan. He is on the Category 3 Plan and states he is following his eating plan approximately 70 % of the time. He states he is exercising 45 minutes 3 times per week.   OBJECTIVE: Visit Diagnoses: Problem List Items Addressed This Visit   None   Vitals Temp: 98.2 F (36.8 C) BP: 114/68 Pulse Rate: 65 SpO2: 97 %   Anthropometric Measurements Height: 5' 11 (1.803 m) Weight: 202 lb (91.6 kg) BMI (Calculated): 28.19 Weight at Last Visit: 200 lb Weight Lost Since Last Visit: 0 Weight Gained Since Last Visit: 2 Starting Weight: 228 lbs Total Weight Loss (lbs): 26 lb (11.8 kg)   Body Composition  Body Fat %: 30.6 % Fat Mass (lbs): 62 lbs Muscle Mass (lbs): 133.8 lbs Total Body Water (lbs): 103 lbs Visceral Fat Rating : 17   Other Clinical Data Today's Visit #: 14 Starting Date: 03/13/23 Comments: Cat 3     ASSESSMENT AND PLAN:  Diet: Zandyr is currently in the action stage of change. As such, his goal is to continue with weight loss efforts and has agreed to the Category 3 Plan.   Exercise:  Older adults should determine their level of effort for physical activity relative to their level of fitness.  Behavior Modification:  We discussed the following Behavioral  Modification Strategies today: increasing lean protein intake, decreasing simple carbohydrates, increasing vegetables, meal planning and cooking strategies, and eating out strategies.   Return in about 6 weeks (around 05/12/2024).   He was informed of the importance of frequent follow up visits to maximize his success with intensive lifestyle modifications for his multiple health conditions.  Attestation Statements:   Reviewed by clinician on day of visit: allergies, medications, problem list, medical history, surgical history, family history, social history, and previous encounter notes.     Donaciano Frizzle, MD

## 2024-04-05 NOTE — Assessment & Plan Note (Signed)
 On statin for cholesterol control.  No refill needed of statin.  Continue current treatment.

## 2024-04-05 NOTE — Assessment & Plan Note (Signed)
 Anthropometric Measurements Height: 5' 11 (1.803 m) Weight: 202 lb (91.6 kg) BMI (Calculated): 28.19 Weight at Last Visit: 200 lb Weight Lost Since Last Visit: 0 Weight Gained Since Last Visit: 2 Starting Weight: 228 lbs Total Weight Loss (lbs): 26 lb (11.8 kg) Body Composition  Body Fat %: 30.6 % Fat Mass (lbs): 62 lbs Muscle Mass (lbs): 133.8 lbs Total Body Water (lbs): 103 lbs Visceral Fat Rating : 17 Other Clinical Data Today's Visit #: 14 Starting Date: 03/13/23 Comments: Cat 3

## 2024-04-16 NOTE — Progress Notes (Signed)
 Order(s) created erroneously. Erroneous order ID: [id number]  Order canceled by: CHART CORRECTION ANALYST NINE, IDENTITY  Order cancel date/time: 04/16/2024 8:53 AM

## 2024-05-09 ENCOUNTER — Other Ambulatory Visit: Payer: Self-pay | Admitting: Cardiology

## 2024-05-09 DIAGNOSIS — R0789 Other chest pain: Secondary | ICD-10-CM

## 2024-05-26 ENCOUNTER — Ambulatory Visit (INDEPENDENT_AMBULATORY_CARE_PROVIDER_SITE_OTHER): Admitting: Family Medicine

## 2024-05-26 ENCOUNTER — Encounter (INDEPENDENT_AMBULATORY_CARE_PROVIDER_SITE_OTHER): Payer: Self-pay | Admitting: Family Medicine

## 2024-05-26 VITALS — BP 121/66 | HR 62 | Temp 98.1°F | Ht 71.0 in | Wt 206.0 lb

## 2024-05-26 DIAGNOSIS — E782 Mixed hyperlipidemia: Secondary | ICD-10-CM | POA: Diagnosis not present

## 2024-05-26 DIAGNOSIS — E66811 Obesity, class 1: Secondary | ICD-10-CM | POA: Diagnosis not present

## 2024-05-26 DIAGNOSIS — Z6828 Body mass index (BMI) 28.0-28.9, adult: Secondary | ICD-10-CM | POA: Diagnosis not present

## 2024-05-26 DIAGNOSIS — R7303 Prediabetes: Secondary | ICD-10-CM

## 2024-05-26 NOTE — Assessment & Plan Note (Signed)
 Recent A1c by visiting Medicare nurse at 5.3.  Continue to work on lifestyle changes with plan to repeat A1c in November.

## 2024-05-26 NOTE — Progress Notes (Unsigned)
   SUBJECTIVE:  Chief Complaint: Obesity  Interim History: Patient's summer has been so busy and Harding expecting another week of busy times and then going to [facility] [place]  Harding extended family.  There are lots of children under the age of 21.  Harding his lifestyle being busy he has been eating out more frequently due to invitations and lack of planning.  He was impressed Harding minimal weight gain.  After his upcoming trip he Harding expecting life to return to normal.  He has started doing some calesthenics to add to his weight training.  Had a point of care A1c done by Medicare home visit.  Thomas Harding Harding here to discuss his progress Harding his obesity treatment plan. He Harding on the Category 3 Plan and states he Harding following his eating plan approximately 70 % of the time. He states he Harding exercising 30 minutes 3 times per week.   OBJECTIVE: Visit Diagnoses: Problem List Items Addressed This Visit       Other   Mixed hyperlipidemia   On crestor  20mg  Harding minimal if any side effects.  Continue current medication and dosage Harding no change.  Labs Harding PCP Thomas fall.      Prediabetes - Primary   Recent A1c by visiting Medicare nurse at 5Thomas3.  Continue to work on lifestyle changes Harding plan to repeat A1c Thomas November.       Vitals Temp: 98Thomas1 F (36Thomas7 C) BP: 121/66 Pulse Rate: 62 SpO2: 99 %   Anthropometric Measurements Height: 5' 11 (1Thomas803 m) Weight: 206 lb (93Thomas4 kg) BMI (Calculated): 28Thomas74 Weight at Last Visit: 202 lb Weight Lost Since Last Visit: 0 Weight Gained Since Last Visit: 4 Starting Weight: 228 lb Total Weight Loss (lbs): 22 lb (9Thomas979 kg)   Body Composition  Body Fat %: 31Thomas5 % Fat Mass (lbs): 65Thomas2 lbs Muscle Mass (lbs): 134Thomas6 lbs Total Body Water (lbs): 105Thomas2 lbs Visceral Fat Rating : 17   Other Clinical Data Today's Visit #: 15 Starting Date: 03/13/23 Comments: Cat  3     ASSESSMENT AND PLAN: Assessment & Plan Prediabetes Recent A1c by visiting Medicare nurse at  5Thomas3.  Continue to work on lifestyle changes Harding plan to repeat A1c Thomas November. Mixed hyperlipidemia On crestor  20mg  Harding minimal if any side effects.  Continue current medication and dosage Harding no change.  Labs Harding PCP Thomas fall. Class 1 obesity Harding serious comorbidity and body mass index (BMI) of 32Thomas0 to 32Thomas9 Thomas adult, unspecified obesity type  BMI 28Thomas0-28Thomas9,adult    Diet: [name] Harding currently Thomas the action stage of change. As such, his goal Harding to continue Harding weight loss efforts and has agreed to the Category 3 Plan.   Exercise:  Older adults should determine their level of effort for physical activity relative to their level of fitness.  Behavior Modification:  We discussed the following Behavioral Modification Strategies today: increasing lean protein intake, decreasing simple carbohydrates, increasing vegetables, meal planning and cooking strategies, and travel eating strategies.   No follow-ups on file.   He was informed of the importance of frequent follow up visits to maximize his success Harding intensive lifestyle modifications for his multiple health conditions.  Attestation Statements:   Reviewed by clinician on day of visit: allergies, medications, problem list, medical history, surgical history, family history, social history, and previous encounter notes.     Adelita Cho, MD

## 2024-05-26 NOTE — Assessment & Plan Note (Signed)
 On crestor  20mg  with minimal if any side effects.  Continue current medication and dosage with no change.  Labs with PCP in fall.

## 2024-06-07 DIAGNOSIS — L728 Other follicular cysts of the skin and subcutaneous tissue: Secondary | ICD-10-CM | POA: Diagnosis not present

## 2024-06-07 DIAGNOSIS — L820 Inflamed seborrheic keratosis: Secondary | ICD-10-CM | POA: Diagnosis not present

## 2024-06-07 DIAGNOSIS — Z1283 Encounter for screening for malignant neoplasm of skin: Secondary | ICD-10-CM | POA: Diagnosis not present

## 2024-06-07 DIAGNOSIS — L738 Other specified follicular disorders: Secondary | ICD-10-CM | POA: Diagnosis not present

## 2024-06-07 DIAGNOSIS — D1801 Hemangioma of skin and subcutaneous tissue: Secondary | ICD-10-CM | POA: Diagnosis not present

## 2024-06-07 DIAGNOSIS — L821 Other seborrheic keratosis: Secondary | ICD-10-CM | POA: Diagnosis not present

## 2024-06-07 DIAGNOSIS — D170 Benign lipomatous neoplasm of skin and subcutaneous tissue of head, face and neck: Secondary | ICD-10-CM | POA: Diagnosis not present

## 2024-06-07 DIAGNOSIS — D485 Neoplasm of uncertain behavior of skin: Secondary | ICD-10-CM | POA: Diagnosis not present

## 2024-06-08 DIAGNOSIS — M25562 Pain in left knee: Secondary | ICD-10-CM | POA: Diagnosis not present

## 2024-06-08 DIAGNOSIS — M5417 Radiculopathy, lumbosacral region: Secondary | ICD-10-CM | POA: Diagnosis not present

## 2024-06-08 DIAGNOSIS — M25512 Pain in left shoulder: Secondary | ICD-10-CM | POA: Diagnosis not present

## 2024-07-08 ENCOUNTER — Ambulatory Visit (INDEPENDENT_AMBULATORY_CARE_PROVIDER_SITE_OTHER): Admitting: Family Medicine

## 2024-07-08 ENCOUNTER — Encounter (INDEPENDENT_AMBULATORY_CARE_PROVIDER_SITE_OTHER): Payer: Self-pay | Admitting: Family Medicine

## 2024-07-08 VITALS — BP 124/71 | HR 59 | Temp 98.1°F | Ht 71.0 in | Wt 205.0 lb

## 2024-07-08 DIAGNOSIS — Z6832 Body mass index (BMI) 32.0-32.9, adult: Secondary | ICD-10-CM

## 2024-07-08 DIAGNOSIS — E782 Mixed hyperlipidemia: Secondary | ICD-10-CM | POA: Diagnosis not present

## 2024-07-08 DIAGNOSIS — Z6828 Body mass index (BMI) 28.0-28.9, adult: Secondary | ICD-10-CM | POA: Diagnosis not present

## 2024-07-08 DIAGNOSIS — E66811 Obesity, class 1: Secondary | ICD-10-CM | POA: Diagnosis not present

## 2024-07-08 DIAGNOSIS — I10 Essential (primary) hypertension: Secondary | ICD-10-CM | POA: Diagnosis not present

## 2024-07-08 MED ORDER — ROSUVASTATIN CALCIUM 20 MG PO TABS: 20.0000 mg | ORAL_TABLET | ORAL | Status: AC

## 2024-07-08 NOTE — Progress Notes (Signed)
   SUBJECTIVE:  Chief Complaint: Obesity  Interim History: Patient had a large family vacation down in [place]  in August.  He has been going to his grandkids sports games recently.  He has gone to the gym a bit but not as much as he wanted.  He is getting everything in.  In the evening and late afternoon he gets slightly tired and once he eats dinner gets more energy.  Hasn't been wearing his CPAP for the last 6 months due to sinus issues.  He realizes this may be contributing to his fatigue. Next few weeks he has some football games he is going to but otherwise he has a lull in work so will be going to the gym.   Thomas Harding is here to discuss his progress with his obesity treatment plan. He is on the Category 3 Plan and states he is following his eating plan approximately 90 % of the time. He states he is exercising 60 minutes 3 times per week.   OBJECTIVE: Visit Diagnoses: Problem List Items Addressed This Visit   None   Vitals Temp: 98.1 F (36.7 C) BP: 124/71 Pulse Rate: (!) 59 SpO2: 95 %   Anthropometric Measurements Height: 5' 11 (1.803 m) Weight: 205 lb (93 kg) BMI (Calculated): 28.6 Weight at Last Visit: 206 lb Weight Lost Since Last Visit: 1 Weight Gained Since Last Visit: 0 Starting Weight: 228 lb Total Weight Loss (lbs): 23 lb (10.4 kg)   Body Composition  Body Fat %: 31 % Fat Mass (lbs): 63.6 lbs Muscle Mass (lbs): 134.8 lbs Total Body Water (lbs): 102 lbs Visceral Fat Rating : 17   Other Clinical Data Today's Visit #: 16 Starting Date: 03/13/23 Comments: Cat 3     ASSESSMENT AND PLAN: Assessment & Plan Mixed hyperlipidemia Recent change in statin from once a week to 4 times a week.  Patient denies any myalgias.  Will need to follow-up at next appointment to ensure patient is still taking medication as previously mentioned and no side effects are occurring. Essential hypertension Blood pressure controlled today.  No chest pain, chest pressure,  or headache reported.  Continue current treatment plan at this time Class 1 obesity with serious comorbidity and body mass index (BMI) of 32.0 to 32.9 in adult, unspecified obesity type  BMI 28.0-28.9,adult    Diet: Thomas Harding is currently in the action stage of change. As such, his goal is to continue with weight loss efforts and has agreed to the Category 3 Plan.   Exercise:  Older adults should determine their level of effort for physical activity relative to their level of fitness.  Patient to start measuring or logging exercises to monitor what he is doing and keep track of progression.  Behavior Modification:  We discussed the following Behavioral Modification Strategies today: increasing lean protein intake, decreasing simple carbohydrates, increasing vegetables, and planning for success.   Return in about 8 weeks (around 09/02/2024).   He was informed of the importance of frequent follow up visits to maximize his success with intensive lifestyle modifications for his multiple health conditions.  Attestation Statements:   Reviewed by clinician on day of visit: allergies, medications, problem list, medical history, surgical history, family history, social history, and previous encounter notes.     Adelita Cho, MD

## 2024-07-11 NOTE — Assessment & Plan Note (Signed)
 Recent change in statin from once a week to 4 times a week.  Patient denies any myalgias.  Will need to follow-up at next appointment to ensure patient is still taking medication as previously mentioned and no side effects are occurring.

## 2024-07-11 NOTE — Assessment & Plan Note (Signed)
 Blood pressure controlled today.  No chest pain, chest pressure, or headache reported.  Continue current treatment plan at this time

## 2024-09-01 ENCOUNTER — Encounter (INDEPENDENT_AMBULATORY_CARE_PROVIDER_SITE_OTHER): Payer: Self-pay | Admitting: Family Medicine

## 2024-09-01 ENCOUNTER — Ambulatory Visit (INDEPENDENT_AMBULATORY_CARE_PROVIDER_SITE_OTHER): Payer: Self-pay | Admitting: Family Medicine

## 2024-09-01 VITALS — BP 119/69 | HR 62 | Temp 98.0°F | Ht 71.0 in | Wt 202.0 lb

## 2024-09-01 DIAGNOSIS — R7303 Prediabetes: Secondary | ICD-10-CM | POA: Diagnosis not present

## 2024-09-01 DIAGNOSIS — M25512 Pain in left shoulder: Secondary | ICD-10-CM | POA: Diagnosis not present

## 2024-09-01 DIAGNOSIS — Z6832 Body mass index (BMI) 32.0-32.9, adult: Secondary | ICD-10-CM

## 2024-09-01 DIAGNOSIS — Z6828 Body mass index (BMI) 28.0-28.9, adult: Secondary | ICD-10-CM | POA: Diagnosis not present

## 2024-09-01 DIAGNOSIS — E66811 Obesity, class 1: Secondary | ICD-10-CM | POA: Diagnosis not present

## 2024-09-01 DIAGNOSIS — G8929 Other chronic pain: Secondary | ICD-10-CM

## 2024-09-01 NOTE — Progress Notes (Signed)
 SUBJECTIVE:  Chief Complaint: Obesity  Interim History: Patient's son is currently dealing with a flare of ulcerative colitis. Patient took some down time off work and hasn't been working due to knee pain.  He took some medications for knee and shoulder and has finally just started being able to work out more consistently.  He got a Armed forces operational officer and has been monitoring it for body composition. Food wise he feels he has been very consistent in terms of meal plan.  Planning for a relatively standard Thanksgiving holiday.  Anticipating to be mindful of intake and choices for the upcoming holidays. No plans to travel for December.   Thomas Harding is here to discuss his progress with his obesity treatment plan. He is on the Category 3 Plan and states he is following his eating plan approximately 95 % of the time. He states he is exercising 60-75 minutes 5 times per week- has been walking some and doing a bit of squats focusing on hamstring strength.  He is doing some curls and is trying to build up chest and shoulders.    OBJECTIVE: Visit Diagnoses: Problem List Items Addressed This Visit       Other   Class 1 obesity with serious comorbidity and body mass index (BMI) of 32.0 to 32.9 in adult   Prediabetes   Other Visit Diagnoses       Chronic left shoulder pain    -  Primary     BMI 28.0-28.9,adult           Vitals Temp: 98 F (36.7 C) BP: 119/69 Pulse Rate: 62 SpO2: 97 %   Anthropometric Measurements Height: 5' 11 (1.803 m) Weight: 202 lb (91.6 kg) BMI (Calculated): 28.19 Weight at Last Visit: 205 lb Weight Lost Since Last Visit: 3 Weight Gained Since Last Visit: 0 Starting Weight: 228 lb Total Weight Loss (lbs): 26 lb (11.8 kg)   Body Composition  Body Fat %: 30.4 % Fat Mass (lbs): 61.4 lbs Muscle Mass (lbs): 133.8 lbs Total Body Water (lbs): 101.4 lbs Visceral Fat Rating : 16   Other Clinical Data Today's Visit #: 17 Starting Date: 03/13/23 Comments: Cat  3     ASSESSMENT AND PLAN: Assessment & Plan Chronic left shoulder pain Patient is being mindful with implementation of physical activity given his chronic left shoulder pain.  Is being followed by orthopedics but is not pursuing surgical intervention at this time. Prediabetes Prior A1c of 5.3 with a repeat A1c pending with labs for PCP follow-up on lab repeat with PCP at next appointment. BMI 28.0-28.9,adult  Class 1 obesity with serious comorbidity and body mass index (BMI) of 32.0 to 32.9 in adult, unspecified obesity type    Diet: Thomas Harding is currently in the action stage of change. As such, his goal is to continue with weight loss efforts and has agreed to the Category 3 Plan.   Exercise:  Older adults should determine their level of effort for physical activity relative to their level of fitness.  Behavior Modification:  We discussed the following Behavioral Modification Strategies today: increasing lean protein intake, decreasing simple carbohydrates, increasing vegetables, meal planning and cooking strategies, holiday eating strategies, and planning for success.   Return in about 10 weeks (around 11/10/2024).   He was informed of the importance of frequent follow up visits to maximize his success with intensive lifestyle modifications for his multiple health conditions.  Attestation Statements:   Reviewed by clinician on day of visit: allergies, medications, problem list, medical  history, surgical history, family history, social history, and previous encounter notes.  Adelita Cho, MD

## 2024-09-03 ENCOUNTER — Other Ambulatory Visit: Payer: Self-pay

## 2024-09-03 DIAGNOSIS — G6289 Other specified polyneuropathies: Secondary | ICD-10-CM

## 2024-09-03 MED ORDER — GABAPENTIN 100 MG PO CAPS: 100.0000 mg | ORAL_CAPSULE | Freq: Every day | ORAL | 2 refills | Status: AC

## 2024-09-06 ENCOUNTER — Encounter: Payer: Self-pay | Admitting: Family Medicine

## 2024-09-06 ENCOUNTER — Ambulatory Visit (INDEPENDENT_AMBULATORY_CARE_PROVIDER_SITE_OTHER): Admitting: Family Medicine

## 2024-09-06 VITALS — BP 124/62 | HR 67 | Temp 98.1°F | Resp 14 | Ht 71.0 in | Wt 206.4 lb

## 2024-09-06 DIAGNOSIS — E782 Mixed hyperlipidemia: Secondary | ICD-10-CM

## 2024-09-06 DIAGNOSIS — I493 Ventricular premature depolarization: Secondary | ICD-10-CM

## 2024-09-06 DIAGNOSIS — R7303 Prediabetes: Secondary | ICD-10-CM

## 2024-09-06 DIAGNOSIS — I10 Essential (primary) hypertension: Secondary | ICD-10-CM

## 2024-09-06 DIAGNOSIS — R0981 Nasal congestion: Secondary | ICD-10-CM

## 2024-09-06 DIAGNOSIS — Z23 Encounter for immunization: Secondary | ICD-10-CM

## 2024-09-06 DIAGNOSIS — G6289 Other specified polyneuropathies: Secondary | ICD-10-CM

## 2024-09-06 LAB — COMPREHENSIVE METABOLIC PANEL WITH GFR
ALT: 19 U/L (ref 0–53)
AST: 26 U/L (ref 0–37)
Albumin: 4.4 g/dL (ref 3.5–5.2)
Alkaline Phosphatase: 61 U/L (ref 39–117)
BUN: 24 mg/dL — ABNORMAL HIGH (ref 6–23)
CO2: 32 meq/L (ref 19–32)
Calcium: 9.5 mg/dL (ref 8.4–10.5)
Chloride: 100 meq/L (ref 96–112)
Creatinine, Ser: 0.99 mg/dL (ref 0.40–1.50)
GFR: 78.64 mL/min (ref >60.00)
Glucose, Bld: 94 mg/dL (ref 70–99)
Potassium: 4.4 meq/L (ref 3.5–5.1)
Sodium: 139 meq/L (ref 135–145)
Total Bilirubin: 1 mg/dL (ref 0.2–1.2)
Total Protein: 6.9 g/dL (ref 6.0–8.3)

## 2024-09-06 LAB — VITAMIN B12: Vitamin B-12: >1500 pg/mL — ABNORMAL HIGH (ref 211–911)

## 2024-09-06 LAB — HEMOGLOBIN A1C: Hgb A1c MFr Bld: 5.5 % (ref 4.6–6.5)

## 2024-09-06 LAB — LIPID PANEL
Cholesterol: 107 mg/dL (ref 0–200)
HDL: 40.8 mg/dL (ref >39.00)
LDL Cholesterol: 53 mg/dL (ref 0–99)
NonHDL: 66.66
Total CHOL/HDL Ratio: 3
Triglycerides: 70 mg/dL (ref 0.0–149.0)
VLDL: 14 mg/dL (ref 0.0–40.0)

## 2024-09-06 MED ORDER — AZELASTINE HCL 0.1 % NA SOLN: 1.0000 | Freq: Two times a day (BID) | NASAL | 3 refills | Status: DC | PRN

## 2024-09-06 NOTE — Patient Instructions (Addendum)
 Continue flonase  and try adding astelin  nasal spray to see if that helps congestion. I referred you to ENT as well. Return to the clinic or go to the nearest emergency room if any of your symptoms worsen or new symptoms occur.  Thank you for coming in today. No change in medications at this time. If there are any concerns on your bloodwork, I will let you know. I would discuss any changes in diltiazem  with cardiology/electrophysiology - let them knwo about your home heart rates and the fatigue.   Take care!

## 2024-09-06 NOTE — Progress Notes (Unsigned)
 Subjective:  Patient ID: Thomas Harding, male    DOB: [birth year]  Age: 68 y.o. MRN: [id number]  CC:  Chief Complaint  Patient presents with   Hyperlipidemia   Follow-up   Nasal Congestion    No other sx. Patient reports that nose is always stopped up. Tried OTC flonase  does not work and OTC allergy medications do not work.     HPI Thomas Harding presents for  Follow-up.  Physical in May.  Nasal congestion: Past year or year and a half. No fever/face pain. Clear then yellow at times. Occasional nosebleed from blowing nose. No recent changes. Affects wearing cpap.  Taking flonase  daily. Min change.   Hyperlipidemia: Crestor  20mg  every other day. No new myalgia/side effects.  Lab Results  Component Value Date   CHOL 123 11/20/2023   HDL 37.20 (L) 11/20/2023   LDLCALC 58 11/20/2023   LDLDIRECT 95.0 02/25/2022   TRIG 137.0 11/20/2023   CHOLHDL 3 11/20/2023   Lab Results  Component Value Date   ALT 20 11/20/2023   AST 21 11/20/2023   ALKPHOS 70 11/20/2023   BILITOT 0.6 11/20/2023    Cardiac History PVCs, followed by cardiology, electrophysiology, ablation in 2023.  Fatigue with metoprolol , switch to diltiazem  CD, and off amlodipine . Still on cardizem  120mg  every day. No palpitations/CP,DOE. Tired at times. HR in 60's.   Polyneuropathy Chronic PPI use, B12 level has been monitored, high in May.  Gabapentin  for neuropathy.  Stable without new side effects.  Current dosing of B12 - every other day. Cut back after prior elevated reading.  Gabapentin  - daily - stable. New shoes help.  Lab Results  Component Value Date   VITAMINB12 >1500 (H) 03/03/2024   Prediabetes: Followed by weight mgt. A1c has improved over time.  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   Essential hypertension 03/02/2024   Central adiposity 02/16/2024   Abnormal laboratory test result 06/18/2023   Peripheral edema 06/18/2023   BMI 29.0-29.9,adult 04/24/2023   Mixed hyperlipidemia 02/16/2023   Prediabetes 02/16/2023   Left foot drop 02/16/2023   Peripheral neuropathy 02/16/2023   Vitamin D  deficiency 02/16/2023   Gastroesophageal reflux disease 10/01/2022   History of colonic polyps 10/01/2022   PVC's (premature ventricular contractions) 09/12/2022   Atypical chest pain 04/26/2022   Sleep apnea 04/26/2022   Fatigue 04/26/2022   Class 1 obesity with serious comorbidity and body mass index (BMI) of 32.0 to 32.9 in adult 04/26/2022   Generalized obesity 04/26/2022   Elevated coronary artery calcium  score 04/25/2022   Abnormal stress test 04/25/2022   Claudication 01/31/2022   Paroxysmal atrial tachycardia 08/02/2021   Precordial pain 06/07/2021   Bradycardia 06/22/2020   PVC (premature ventricular contraction) 06/22/2020   Spinal stenosis of lumbar region 04/20/2020   Lumbar radiculopathy 04/20/2020   Dyspnea 11/22/2016   Past Medical History:  Diagnosis Date   Allergy    GERD (gastroesophageal reflux disease)    Heart murmur    High cholesterol    Hyperlipidemia    Hypertension    Joint pain    Leg pain    Neuromuscular disorder (HCC)    Neuropathy    OSA (obstructive sleep  apnea)    PVC's (premature ventricular contractions)    Sleep apnea    SOB (shortness of breath)    Squamous cell carcinoma of skin 01/16/2017   in situ- right crown    Vitamin D  deficiency    Past Surgical History:  Procedure Laterality Date   LEFT HEART CATH AND CORONARY ANGIOGRAPHY N/A 05/14/2022   Procedure: LEFT HEART CATH AND CORONARY ANGIOGRAPHY;  Surgeon: Elmira Newman PARAS, MD;  Location: [facility];  Service: Cardiovascular;  Laterality: N/A;   LUMBAR LAMINECTOMY/DECOMPRESSION MICRODISCECTOMY Left 05/18/2020    Procedure: Microlumbar decompression Lumbar Four- Lumbar Five, Lumbar Five- Sacral One left;  Surgeon: Duwayne Purchase, MD;  Location: [facility] OR;  Service: Orthopedics;  Laterality: Left;  Microlumbar decompression Lumbar Five- Sacral One left   PVC ABLATION N/A 09/11/2022   Procedure: PVC ABLATION;  Surgeon: Waddell Danelle ORN, MD;  Location: [facility];  Service: Cardiovascular;  Laterality: N/A;   ROTATOR CUFF REPAIR Right    SPINE SURGERY  05/2020   thumb surgery Left    TONSILLECTOMY     VASECTOMY     Allergies  Allergen Reactions   Bee Venom Anaphylaxis   Penicillins Hives and Swelling   Prior to Admission medications   Medication Sig Start Date End Date Taking? Authorizing Provider  acetaminophen  (TYLENOL ) 500 MG tablet Take 1,000 mg by mouth daily as needed for moderate pain.   Yes [provider]  Alpha-Lipoic Acid 600 MG CAPS Take 1,200 mg by mouth daily.   Yes [provider]  Cholecalciferol (VITAMIN D3 PO) Take 4,000 Units by mouth daily.   Yes [provider]  diltiazem  (CARDIZEM  CD) 120 MG 24 hr capsule Take 1 capsule (120 mg total) by mouth daily. 01/06/24  Yes Patwardhan, Manish J, MD  gabapentin  (NEURONTIN ) 100 MG capsule Take 1 capsule (100 mg total) by mouth at bedtime. 09/03/24  Yes Levora Purchase SAUNDERS, MD  ibuprofen (ADVIL) 200 MG tablet Take 200 mg by mouth every 6 (six) hours as needed.   Yes [provider]  Misc Natural Products (TURMERIC CURCUMIN) CAPS Take by mouth. 1500 MG ONCE PER DAY   Yes [provider]  omeprazole  (PRILOSEC ) 20 MG capsule Take 1 capsule (20 mg total) by mouth daily. 03/03/24  Yes Levora Purchase SAUNDERS, MD  OVER THE COUNTER MEDICATION Take 2 capsules by mouth daily. H.A. Joint formula   Yes [provider]  rosuvastatin  (CRESTOR ) 20 MG tablet Take 1 tablet (20 mg total) by mouth every Monday,Wednesday,Friday, and Sunday at 6 PM. 07/09/24  Yes Berkeley Adelita PENNER, MD  vitamin B-12 (CYANOCOBALAMIN )  500 MCG tablet Take 500 mcg by mouth daily.   Yes [provider]   Social History   Socioeconomic History   Marital status: Married    Spouse name: Not on file   Number of children: 2   Years of education: Not on file   Highest education level: Not on file  Occupational History   Occupation: Product/process Development Scientist   Occupation: Research Scientist (medical)  Tobacco Use   Smoking status: Never    Passive exposure: Never   Smokeless tobacco: Never  Vaping Use   Vaping status: Never Used  Substance and Sexual Activity   Alcohol use: Yes    Comment: occ   Drug use: No   Sexual activity: Yes  Other Topics Concern   Not on file  Social History Narrative   Not on file   Social Drivers of Health  Financial Resource Strain: Low Risk  (11/20/2022)   Overall Financial Resource Strain (CARDIA)    Difficulty of Paying Living Expenses: Not hard at all  Food Insecurity: No Food Insecurity (03/14/2023)   Hunger Vital Sign    Worried About Running Out of Food in the Last Year: Never true    Ran Out of Food in the Last Year: Never true  Transportation Needs: No Transportation Needs (03/14/2023)   PRAPARE - Administrator, Civil Service (Medical): No    Lack of Transportation (Non-Medical): No  Physical Activity: Patient Declined (11/20/2022)   Exercise Vital Sign    Days of Exercise per Week: Patient declined    Minutes of Exercise per Session: Patient declined  Stress: No Stress Concern Present (11/20/2022)   [employer] of Occupational Health - Occupational Stress Questionnaire    Feeling of Stress : Not at all  Social Connections: Socially Integrated (11/20/2022)   Social Connection and Isolation Panel    Frequency of Communication with Friends and Family: More than three times a week    Frequency of Social Gatherings with Friends and Family: More than three times a week    Attends Religious Services: More than 4 times per year    Active Member of [employer] or Organizations:  Yes    Attends Engineer, Structural: More than 4 times per year    Marital Status: Married  Catering Manager Violence: Not At Risk (03/14/2023)   Humiliation, Afraid, Rape, and Kick questionnaire    Fear of Current or Ex-Partner: No    Emotionally Abused: No    Physically Abused: No    Sexually Abused: No    Review of Systems  Constitutional:  Positive for fatigue (slight fatigue - HR in 60's, no recent changes.). Negative for unexpected weight change.  Eyes:  Negative for visual disturbance.  Respiratory:  Negative for cough, chest tightness and shortness of breath.   Cardiovascular:  Negative for chest pain, palpitations and leg swelling.  Gastrointestinal:  Negative for abdominal pain and blood in stool.  Neurological:  Negative for dizziness, light-headedness and headaches.     Objective:   Vitals:   09/06/24 0825 09/06/24 0835  BP: 100/60 124/62  Pulse: 67   Resp: 14   Temp: 98.1 F (36.7 C)   TempSrc: Temporal   SpO2: 96%   Weight: 206 lb 6.4 oz (93.6 kg)   Height: 5' 11 (1.803 m)      Physical Exam Vitals reviewed.  Constitutional:      Appearance: He is well-developed.  HENT:     Head: Normocephalic and atraumatic.     Nose: No congestion or rhinorrhea.     Comments: No clot or bleeding.  Neck:     Vascular: No carotid bruit or JVD.  Cardiovascular:     Rate and Rhythm: Regular rhythm. Bradycardia present.     Heart sounds: Normal heart sounds. No murmur heard. Pulmonary:     Effort: Pulmonary effort is normal.     Breath sounds: Normal breath sounds. No rales.  Musculoskeletal:     Right lower leg: No edema.     Left lower leg: No edema.  Skin:    General: Skin is warm and dry.  Neurological:     Mental Status: He is alert and oriented to person, place, and time.  Psychiatric:        Mood and Affect: Mood normal.        Assessment & Plan:  Abdalrahman Clementson  Eisenhardt is a 68 y.o. male . Nasal congestion - Plan: azelastine  (ASTELIN ) 0.1 %  nasal spray, Ambulatory referral to ENT  - Question component of chronic sinusitis versus underlying allergic rhinitis.  Minimal change of Flonase .  Trial of azelastine  nasal spray, refer to ENT.  Need for influenza vaccination - Plan: Flu vaccine HIGH DOSE PF(Fluzone Trivalent)  Prediabetes - Plan: Hemoglobin A1c  - Diet/exercise approach, check labs and adjust plan accordingly  Other polyneuropathy - Plan: B12  - Has cut back on dosing given prior B12 reading.  Neuropathy symptoms stable. improved with new footwear.  Mixed hyperlipidemia - Plan: Comprehensive metabolic panel with GFR, Lipid panel  - Check lipids, CMP, adjust plan accordingly.  Continue Crestor  same dose.  Essential hypertension - Plan: Comprehensive metabolic panel with GFR PVC's (premature ventricular contractions) - Plan: Comprehensive metabolic panel with GFR  - Some intermittent fatigue, followed by cardiology as above with history of PVCs, hypertension stable.  Recommended he discuss fatigue, heart rates and possible medication changes with cardiology/electrophysiology.  Labs as above.  Meds ordered this encounter  Medications   azelastine  (ASTELIN ) 0.1 % nasal spray    Sig: Place 1-2 sprays into both nostrils 2 (two) times daily as needed for rhinitis. Use in each nostril as directed    Dispense:  30 mL    Refill:  3   Patient Instructions  Continue flonase  and try adding astelin  nasal spray to see if that helps congestion. I referred you to ENT as well. Return to the clinic or go to the nearest emergency room if any of your symptoms worsen or new symptoms occur.  Thank you for coming in today. No change in medications at this time. If there are any concerns on your bloodwork, I will let you know. I would discuss any changes in diltiazem  with cardiology/electrophysiology - let them knwo about your home heart rates and the fatigue.   Take care!       Signed,   Thomas Pines, MD [facility],  [facility] 09/06/24 9:05 AM

## 2024-09-09 NOTE — Assessment & Plan Note (Signed)
 Prior A1c of 5.3 with a repeat A1c pending with labs for PCP follow-up on lab repeat with PCP at next appointment.

## 2024-09-10 ENCOUNTER — Encounter (INDEPENDENT_AMBULATORY_CARE_PROVIDER_SITE_OTHER): Payer: Self-pay

## 2024-09-12 ENCOUNTER — Ambulatory Visit: Payer: Self-pay | Admitting: Family Medicine

## 2024-09-24 ENCOUNTER — Ambulatory Visit (INDEPENDENT_AMBULATORY_CARE_PROVIDER_SITE_OTHER): Admitting: Family Medicine

## 2024-09-24 ENCOUNTER — Encounter: Payer: Self-pay | Admitting: Family Medicine

## 2024-09-24 VITALS — BP 136/88 | HR 65 | Temp 98.8°F | Resp 15 | Ht 71.0 in | Wt 203.6 lb

## 2024-09-24 DIAGNOSIS — R7303 Prediabetes: Secondary | ICD-10-CM | POA: Diagnosis not present

## 2024-09-24 DIAGNOSIS — J329 Chronic sinusitis, unspecified: Secondary | ICD-10-CM | POA: Diagnosis not present

## 2024-09-24 MED ORDER — DOXYCYCLINE HYCLATE 100 MG PO TABS: 100.0000 mg | ORAL_TABLET | Freq: Two times a day (BID) | ORAL | 0 refills | Status: DC

## 2024-09-24 NOTE — Patient Instructions (Addendum)
 Start doxycycline  for suspected sinus infection. Saline nasal spray as needed. See info below. Let me know if not improving or any new symptoms.    Sinus Infection, Adult A sinus infection, also called sinusitis, is inflammation of your sinuses. Sinuses are hollow spaces in the bones around your face. Your sinuses are located: Around your eyes. In the middle of your forehead. Behind your nose. In your cheekbones. Mucus normally drains out of your sinuses. When your nasal tissues become inflamed or swollen, mucus can become trapped or blocked. This allows bacteria, viruses, and fungi to grow, which leads to infection. Most infections of the sinuses are caused by a virus. A sinus infection can develop quickly. It can last for up to 4 weeks (acute) or for more than 12 weeks (chronic). A sinus infection often develops after a cold. What are the causes? This condition is caused by anything that creates swelling in the sinuses or stops mucus from draining. This includes: Allergies. Asthma. Infection from bacteria or viruses. Deformities or blockages in your nose or sinuses. Abnormal growths in the nose (nasal polyps). Pollutants, such as chemicals or irritants in the air. Infection from fungi. This is rare. What increases the risk? You are more likely to develop this condition if you: Have a weak body defense system (immune system). Do a lot of swimming or diving. Overuse nasal sprays. Smoke. What are the signs or symptoms? The main symptoms of this condition are pain and a feeling of pressure around the affected sinuses. Other symptoms include: Stuffy nose or congestion that makes it difficult to breathe through your nose. Thick yellow or greenish drainage from your nose. Tenderness, swelling, and warmth over the affected sinuses. A cough that may get worse at night. Decreased sense of smell and taste. Extra mucus that collects in the throat or the back of the nose (postnasal drip)  causing a sore throat or bad breath. Tiredness (fatigue). Fever. How is this diagnosed? This condition is diagnosed based on: Your symptoms. Your medical history. A physical exam. Tests to find out if your condition is acute or chronic. This may include: Checking your nose for nasal polyps. Viewing your sinuses using a device that has a light (endoscope). Testing for allergies or bacteria. Imaging tests, such as an MRI or CT scan. In rare cases, a bone biopsy may be done to rule out more serious types of fungal sinus disease. How is this treated? Treatment for a sinus infection depends on the cause and whether your condition is chronic or acute. If caused by a virus, your symptoms should go away on their own within 10 days. You may be given medicines to relieve symptoms. They include: Medicines that shrink swollen nasal passages (decongestants). A spray that eases inflammation of the nostrils (topical intranasal corticosteroids). Rinses that help get rid of thick mucus in your nose (nasal saline washes). Medicines that treat allergies (antihistamines). Over-the-counter pain relievers. If caused by bacteria, your health care provider may recommend waiting to see if your symptoms improve. Most bacterial infections will get better without antibiotic medicine. You may be given antibiotics if you have: A severe infection. A weak immune system. If caused by narrow nasal passages or nasal polyps, surgery may be needed. Follow these instructions at home: Medicines Take, use, or apply over-the-counter and prescription medicines only as told by your health care provider. These may include nasal sprays. If you were prescribed an antibiotic medicine, take it as told by your health care provider. Do not stop  taking the antibiotic even if you start to feel better. Hydrate and humidify  Drink enough fluid to keep your urine pale yellow. Staying hydrated will help to thin your mucus. Use a cool mist  humidifier to keep the humidity level in your home above 50%. Inhale steam for 10-15 minutes, 3-4 times a day, or as told by your health care provider. You can do this in the bathroom while a hot shower is running. Limit your exposure to cool or dry air. Rest Rest as much as possible. Sleep with your head raised (elevated). Make sure you get enough sleep each night. General instructions  Apply a warm, moist washcloth to your face 3-4 times a day or as told by your health care provider. This will help with discomfort. Use nasal saline washes as often as told by your health care provider. Wash your hands often with soap and water to reduce your exposure to germs. If soap and water are not available, use hand sanitizer. Do not smoke. Avoid being around people who are smoking (secondhand smoke). Keep all follow-up visits. This is important. Contact a health care provider if: You have a fever. Your symptoms get worse. Your symptoms do not improve within 10 days. Get help right away if: You have a severe headache. You have persistent vomiting. You have severe pain or swelling around your face or eyes. You have vision problems. You develop confusion. Your neck is stiff. You have trouble breathing. These symptoms may be an emergency. Get help right away. Call 911. Do not wait to see if the symptoms will go away. Do not drive yourself to the hospital. Summary A sinus infection is soreness and inflammation of your sinuses. Sinuses are hollow spaces in the bones around your face. This condition is caused by nasal tissues that become inflamed or swollen. The swelling traps or blocks the flow of mucus. This allows bacteria, viruses, and fungi to grow, which leads to infection. If you were prescribed an antibiotic medicine, take it as told by your health care provider. Do not stop taking the antibiotic even if you start to feel better. Keep all follow-up visits. This is important. This  information is not intended to replace advice given to you by your health care provider. Make sure you discuss any questions you have with your health care provider. Document Revised: 09/04/2021 Document Reviewed: 09/04/2021 Elsevier Patient Education  2024 [employer].

## 2024-09-24 NOTE — Progress Notes (Signed)
 Subjective:  Patient ID: Thomas Harding, male    DOB: 1956/05/08  Age: 68 y.o. MRN: [id number]  CC:  Chief Complaint  Patient presents with   Nasal Congestion    Nasal congestion persist and thick mucus coming out of nose. Sx started over 2 weeks ago. Has taken dayquil and he feels fine for an hour or two.      HPI Thomas Harding presents for   Nasal congestion Symptoms past 2 weeks. Initial nasal congestion, then persistent.  Discolored nasal congestion during that time.  Minimal improvement temporarily with DayQuil but persistent congestion and thick mucus afterwards. Denies fever, face pain or tooth pain. No dyspnea, no chest symptoms. Eating and drinking ok. No relief with astelin  ns.      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   Essential hypertension 03/02/2024   Central adiposity 02/16/2024   Abnormal laboratory test result 06/18/2023   Peripheral edema 06/18/2023   BMI 29.0-29.9,adult 04/24/2023   Mixed hyperlipidemia 02/16/2023   Prediabetes 02/16/2023   Left foot drop 02/16/2023   Peripheral neuropathy 02/16/2023   Vitamin D  deficiency 02/16/2023   Gastroesophageal reflux disease 10/01/2022   History of colonic polyps 10/01/2022   PVC's (premature ventricular contractions) 09/12/2022   Atypical chest pain 04/26/2022   Sleep apnea 04/26/2022   Fatigue 04/26/2022   Class 1 obesity with serious comorbidity and body mass index (BMI) of 32.0 to 32.9 in adult 04/26/2022   Generalized obesity 04/26/2022   Elevated coronary artery calcium  score 04/25/2022   Abnormal stress test 04/25/2022   Claudication 01/31/2022   Paroxysmal atrial tachycardia 08/02/2021   Precordial pain 06/07/2021   Bradycardia 06/22/2020   PVC (premature ventricular contraction) 06/22/2020   Spinal stenosis of lumbar region 04/20/2020   Lumbar radiculopathy 04/20/2020   Dyspnea 11/22/2016   Past Medical History:  Diagnosis  Date   Allergy    GERD (gastroesophageal reflux disease)    Heart murmur    High cholesterol    Hyperlipidemia    Hypertension    Joint pain    Leg pain    Neuromuscular disorder (HCC)    Neuropathy    OSA (obstructive sleep apnea)    PVC's (premature ventricular contractions)    Sleep apnea    SOB (shortness of breath)    Squamous cell carcinoma of skin 01/16/2017   in situ- right crown    Vitamin D  deficiency    Past Surgical History:  Procedure Laterality Date   LEFT HEART CATH AND CORONARY ANGIOGRAPHY N/A 05/14/2022   Procedure: LEFT HEART CATH AND CORONARY ANGIOGRAPHY;  Surgeon: Elmira Newman PARAS, MD;  Location: [facility];  Service: Cardiovascular;  Laterality: N/A;   LUMBAR LAMINECTOMY/DECOMPRESSION MICRODISCECTOMY Left 05/18/2020   Procedure: Microlumbar decompression Lumbar Four- Lumbar Five, Lumbar Five- Sacral One left;  Surgeon: Duwayne Purchase, MD;  Location: [facility] OR;  Service: Orthopedics;  Laterality: Left;  Microlumbar decompression Lumbar Five- Sacral One left   PVC ABLATION N/A 09/11/2022   Procedure: PVC ABLATION;  Surgeon: Waddell Danelle ORN, MD;  Location: [facility];  Service: Cardiovascular;  Laterality: N/A;   ROTATOR CUFF REPAIR Right    SPINE SURGERY  05/2020   thumb surgery Left    TONSILLECTOMY     VASECTOMY     Allergies[1] Prior to Admission medications  Medication Sig Start Date End Date Taking? Authorizing Provider  acetaminophen  (TYLENOL ) 500 MG tablet Take 1,000 mg  by mouth daily as needed for moderate pain.   Yes [provider]  Alpha-Lipoic Acid 600 MG CAPS Take 1,200 mg by mouth daily.   Yes [provider]  azelastine  (ASTELIN ) 0.1 % nasal spray Place 1-2 sprays into both nostrils 2 (two) times daily as needed for rhinitis. Use in each nostril as directed 09/06/24  Yes Levora Reyes SAUNDERS, MD  Cholecalciferol (VITAMIN D3 PO) Take 4,000 Units by mouth daily.   Yes [provider]  diltiazem  (CARDIZEM   CD) 120 MG 24 hr capsule Take 1 capsule (120 mg total) by mouth daily. 01/06/24  Yes Patwardhan, Manish J, MD  gabapentin  (NEURONTIN ) 100 MG capsule Take 1 capsule (100 mg total) by mouth at bedtime. 09/03/24  Yes Levora Reyes SAUNDERS, MD  ibuprofen (ADVIL) 200 MG tablet Take 200 mg by mouth every 6 (six) hours as needed.   Yes [provider]  Misc Natural Products (TURMERIC CURCUMIN) CAPS Take by mouth. 1500 MG ONCE PER DAY   Yes [provider]  omeprazole  (PRILOSEC ) 20 MG capsule Take 1 capsule (20 mg total) by mouth daily. 03/03/24  Yes Levora Reyes SAUNDERS, MD  OVER THE COUNTER MEDICATION Take 2 capsules by mouth daily. H.A. Joint formula   Yes [provider]  rosuvastatin  (CRESTOR ) 20 MG tablet Take 1 tablet (20 mg total) by mouth every Monday,Wednesday,Friday, and Sunday at 6 PM. 07/09/24  Yes Berkeley Adelita PENNER, MD  vitamin B-12 (CYANOCOBALAMIN ) 500 MCG tablet Take 500 mcg by mouth daily.   Yes [provider]   Social History   Socioeconomic History   Marital status: Married    Spouse name: Not on file   Number of children: 2   Years of education: Not on file   Highest education level: Not on file  Occupational History   Occupation: Product/process Development Scientist   Occupation: Research Scientist (medical)  Tobacco Use   Smoking status: Never    Passive exposure: Never   Smokeless tobacco: Never  Vaping Use   Vaping status: Never Used  Substance and Sexual Activity   Alcohol use: Yes    Comment: occ   Drug use: No   Sexual activity: Yes  Other Topics Concern   Not on file  Social History Narrative   Not on file   Social Drivers of Health   Tobacco Use: Low Risk (09/06/2024)   Patient History    Smoking Tobacco Use: Never    Smokeless Tobacco Use: Never    Passive Exposure: Never  Financial Resource Strain: Low Risk (11/20/2022)   Overall Financial Resource Strain (CARDIA)    Difficulty of Paying Living Expenses: Not hard at all  Food Insecurity: No Food  Insecurity (03/14/2023)   Hunger Vital Sign    Worried About Running Out of Food in the Last Year: Never true    Ran Out of Food in the Last Year: Never true  Transportation Needs: No Transportation Needs (03/14/2023)   PRAPARE - Administrator, Civil Service (Medical): No    Lack of Transportation (Non-Medical): No  Physical Activity: Patient Declined (11/20/2022)   Exercise Vital Sign    Days of Exercise per Week: Patient declined    Minutes of Exercise per Session: Patient declined  Stress: No Stress Concern Present (11/20/2022)   [employer] of Occupational Health - Occupational Stress Questionnaire    Feeling of Stress : Not at all  Social Connections: Socially Integrated (11/20/2022)   Social Connection and Isolation Panel    Frequency  of Communication with Friends and Family: More than three times a week    Frequency of Social Gatherings with Friends and Family: More than three times a week    Attends Religious Services: More than 4 times per year    Active Member of Clubs or Organizations: Yes    Attends Banker Meetings: More than 4 times per year    Marital Status: Married  Catering Manager Violence: Not At Risk (03/14/2023)   Humiliation, Afraid, Rape, and Kick questionnaire    Fear of Current or Ex-Partner: No    Emotionally Abused: No    Physically Abused: No    Sexually Abused: No  Depression (PHQ2-9): Low Risk (03/03/2024)   Depression (PHQ2-9)    PHQ-2 Score: 0  Alcohol Screen: Low Risk (11/20/2022)   Alcohol Screen    Last Alcohol Screening Score (AUDIT): 0  Housing: Low Risk (03/14/2023)   Housing    Last Housing Risk Score: 0  Utilities: Not At Risk (03/14/2023)   AHC Utilities    Threatened with loss of utilities: No  Health Literacy: Not on file    Review of Systems   Objective:   Vitals:   09/24/24 1144  BP: 136/88  Pulse: 65  Resp: 15  Temp: 98.8 F (37.1 C)  TempSrc: Temporal  SpO2: 95%  Weight: 203 lb 9.6 oz (92.4 kg)   Height: 5' 11 (1.803 m)     Physical Exam Vitals reviewed.  Constitutional:      Appearance: He is well-developed.  HENT:     Head: Normocephalic and atraumatic.     Right Ear: Tympanic membrane, ear canal and external ear normal.     Left Ear: Tympanic membrane, ear canal and external ear normal.     Nose: No rhinorrhea.     Mouth/Throat:     Pharynx: No oropharyngeal exudate or posterior oropharyngeal erythema.  Eyes:     Conjunctiva/sclera: Conjunctivae normal.     Pupils: Pupils are equal, round, and reactive to light.  Cardiovascular:     Rate and Rhythm: Normal rate and regular rhythm.     Heart sounds: Normal heart sounds. No murmur heard. Pulmonary:     Effort: Pulmonary effort is normal.     Breath sounds: Normal breath sounds. No wheezing, rhonchi or rales.  Abdominal:     Palpations: Abdomen is soft.     Tenderness: There is no abdominal tenderness.  Musculoskeletal:     Cervical back: Neck supple.  Lymphadenopathy:     Cervical: No cervical adenopathy.  Skin:    General: Skin is warm and dry.     Findings: No rash.  Neurological:     Mental Status: He is alert and oriented to person, place, and time.  Psychiatric:        Behavior: Behavior normal.      Assessment & Plan:  Thomas Harding is a 68 y.o. male . Sinusitis, unspecified chronicity, unspecified location - Plan: doxycycline  (VIBRA -TABS) 100 MG tablet  - Possible initial viral symptoms on top of his chronic sinus symptoms, no significant change with Astelin  nasal spray from prior visit.  Minimal change with Flonase  previously.  Recent worsening, suspected secondary bacterial infection. Penicillin allergy with significant reaction previously, will avoid cephalosporin.  Start doxycycline  with potential side effects discussed, symptomatic care including saline nasal spray, humidifier, rtc precautions.  Meds ordered this encounter  Medications   doxycycline  (VIBRA -TABS) 100 MG tablet    Sig: Take 1  tablet (100 mg total)  by mouth 2 (two) times daily.    Dispense:  20 tablet    Refill:  0   Patient Instructions  Start doxycycline  for suspected sinus infection. Saline nasal spray as needed. See info below. Let me know if not improving or any new symptoms.    Sinus Infection, Adult A sinus infection, also called sinusitis, is inflammation of your sinuses. Sinuses are hollow spaces in the bones around your face. Your sinuses are located: Around your eyes. In the middle of your forehead. Behind your nose. In your cheekbones. Mucus normally drains out of your sinuses. When your nasal tissues become inflamed or swollen, mucus can become trapped or blocked. This allows bacteria, viruses, and fungi to grow, which leads to infection. Most infections of the sinuses are caused by a virus. A sinus infection can develop quickly. It can last for up to 4 weeks (acute) or for more than 12 weeks (chronic). A sinus infection often develops after a cold. What are the causes? This condition is caused by anything that creates swelling in the sinuses or stops mucus from draining. This includes: Allergies. Asthma. Infection from bacteria or viruses. Deformities or blockages in your nose or sinuses. Abnormal growths in the nose (nasal polyps). Pollutants, such as chemicals or irritants in the air. Infection from fungi. This is rare. What increases the risk? You are more likely to develop this condition if you: Have a weak body defense system (immune system). Do a lot of swimming or diving. Overuse nasal sprays. Smoke. What are the signs or symptoms? The main symptoms of this condition are pain and a feeling of pressure around the affected sinuses. Other symptoms include: Stuffy nose or congestion that makes it difficult to breathe through your nose. Thick yellow or greenish drainage from your nose. Tenderness, swelling, and warmth over the affected sinuses. A cough that may get worse at  night. Decreased sense of smell and taste. Extra mucus that collects in the throat or the back of the nose (postnasal drip) causing a sore throat or bad breath. Tiredness (fatigue). Fever. How is this diagnosed? This condition is diagnosed based on: Your symptoms. Your medical history. A physical exam. Tests to find out if your condition is acute or chronic. This may include: Checking your nose for nasal polyps. Viewing your sinuses using a device that has a light (endoscope). Testing for allergies or bacteria. Imaging tests, such as an MRI or CT scan. In rare cases, a bone biopsy may be done to rule out more serious types of fungal sinus disease. How is this treated? Treatment for a sinus infection depends on the cause and whether your condition is chronic or acute. If caused by a virus, your symptoms should go away on their own within 10 days. You may be given medicines to relieve symptoms. They include: Medicines that shrink swollen nasal passages (decongestants). A spray that eases inflammation of the nostrils (topical intranasal corticosteroids). Rinses that help get rid of thick mucus in your nose (nasal saline washes). Medicines that treat allergies (antihistamines). Over-the-counter pain relievers. If caused by bacteria, your health care provider may recommend waiting to see if your symptoms improve. Most bacterial infections will get better without antibiotic medicine. You may be given antibiotics if you have: A severe infection. A weak immune system. If caused by narrow nasal passages or nasal polyps, surgery may be needed. Follow these instructions at home: Medicines Take, use, or apply over-the-counter and prescription medicines only as told by your health care provider.  These may include nasal sprays. If you were prescribed an antibiotic medicine, take it as told by your health care provider. Do not stop taking the antibiotic even if you start to feel better. Hydrate and  humidify  Drink enough fluid to keep your urine pale yellow. Staying hydrated will help to thin your mucus. Use a cool mist humidifier to keep the humidity level in your home above 50%. Inhale steam for 10-15 minutes, 3-4 times a day, or as told by your health care provider. You can do this in the bathroom while a hot shower is running. Limit your exposure to cool or dry air. Rest Rest as much as possible. Sleep with your head raised (elevated). Make sure you get enough sleep each night. General instructions  Apply a warm, moist washcloth to your face 3-4 times a day or as told by your health care provider. This will help with discomfort. Use nasal saline washes as often as told by your health care provider. Wash your hands often with soap and water to reduce your exposure to germs. If soap and water are not available, use hand sanitizer. Do not smoke. Avoid being around people who are smoking (secondhand smoke). Keep all follow-up visits. This is important. Contact a health care provider if: You have a fever. Your symptoms get worse. Your symptoms do not improve within 10 days. Get help right away if: You have a severe headache. You have persistent vomiting. You have severe pain or swelling around your face or eyes. You have vision problems. You develop confusion. Your neck is stiff. You have trouble breathing. These symptoms may be an emergency. Get help right away. Call 911. Do not wait to see if the symptoms will go away. Do not drive yourself to the hospital. Summary A sinus infection is soreness and inflammation of your sinuses. Sinuses are hollow spaces in the bones around your face. This condition is caused by nasal tissues that become inflamed or swollen. The swelling traps or blocks the flow of mucus. This allows bacteria, viruses, and fungi to grow, which leads to infection. If you were prescribed an antibiotic medicine, take it as told by your health care provider. Do  not stop taking the antibiotic even if you start to feel better. Keep all follow-up visits. This is important. This information is not intended to replace advice given to you by your health care provider. Make sure you discuss any questions you have with your health care provider. Document Revised: 09/04/2021 Document Reviewed: 09/04/2021 Elsevier Patient Education  2024 [employer]     Signed,   Reyes Pines, MD [facility], [facility] 09/24/2024 12:49 PM       [1]  Allergies Allergen Reactions   Bee Venom Anaphylaxis   Penicillins Hives and Swelling

## 2024-10-13 ENCOUNTER — Encounter: Payer: Self-pay | Admitting: Family Medicine

## 2024-10-13 DIAGNOSIS — I493 Ventricular premature depolarization: Secondary | ICD-10-CM

## 2024-10-13 DIAGNOSIS — R7303 Prediabetes: Secondary | ICD-10-CM

## 2024-10-13 DIAGNOSIS — I10 Essential (primary) hypertension: Secondary | ICD-10-CM

## 2024-10-13 DIAGNOSIS — L570 Actinic keratosis: Secondary | ICD-10-CM

## 2024-10-15 NOTE — Telephone Encounter (Signed)
 Is this true? If patient is already established, do they still need a referral?

## 2024-10-20 NOTE — Telephone Encounter (Signed)
 Patient needs new referral comment from Tobias Ally Please place a new referral for each specialist that is part of his care team that he is scheduled to see again this year.   Pended below is Cardiology with Dr Elmira as that appears to be the most recent provider for cardiology.   No other specialists identified from recent Media or [facility] please advise if additional referrals should be placed

## 2024-11-01 ENCOUNTER — Ambulatory Visit (INDEPENDENT_AMBULATORY_CARE_PROVIDER_SITE_OTHER)

## 2024-11-01 ENCOUNTER — Encounter (INDEPENDENT_AMBULATORY_CARE_PROVIDER_SITE_OTHER): Payer: Self-pay

## 2024-11-01 VITALS — BP 134/82 | HR 66 | Ht 71.0 in | Wt 192.0 lb

## 2024-11-01 DIAGNOSIS — J3489 Other specified disorders of nose and nasal sinuses: Secondary | ICD-10-CM | POA: Diagnosis not present

## 2024-11-01 DIAGNOSIS — J328 Other chronic sinusitis: Secondary | ICD-10-CM

## 2024-11-01 DIAGNOSIS — J343 Hypertrophy of nasal turbinates: Secondary | ICD-10-CM

## 2024-11-01 DIAGNOSIS — J342 Deviated nasal septum: Secondary | ICD-10-CM | POA: Diagnosis not present

## 2024-11-01 NOTE — Progress Notes (Signed)
 Dear Dr. Levora, Here is my assessment for our mutual patient, Thomas Harding. Thank you for allowing me the opportunity to care for your patient. Please do not hesitate to contact me should you have any other questions. Sincerely, Dr. Hadassah Parody  Otolaryngology Clinic Note Referring provider: Dr. Levora HPI:   Initial HPI (11/01/2024)  69 year old male presenting for evaluation of nasal obstruction and difficulty tolerating CPAP.  He has had persistent nasal obstruction for 15 years, feels like worsening over the last year.  Sensation of nasal blockage impeding airflow predominantly on the right side but can switch sides.  Frequent nose blowing and occasionally expels a plug of thick yellow mucus, sometimes accompanied by epistaxis because he feels like he is blowing some hard.  He sometimes gets symptomatic relief after clearing the obstruction but this is transient only lasting for an hour or so before recurrence.  Over the last 10 to 12 weeks the congestion has been very persistent and bothersome.  No significant history of allergies.  No persistent rhinorrhea. Currently using Flonase  and Astelin  which she has been using for several months without significant improvement.   Antibiotic use in the past has cleared up some of the congestion.  He is not able to tolerate his CPAP due to the nasal obstruction feels like he is getting suffocated when he uses the CPAP.  No history of surgery on his sinuses or on his nose.     Independent Review of Additional Tests or Records:  Referral note 09/06/2024 Reyes Levora, MD: Patient reports nose is always stuffed up.  Flonase  does not work.  Over-the-counter allergy medications do not work  09/06/2024 hemoglobin A1c 5.5  PMH/Meds/All/SocHx/FamHx/ROS:   Past Medical History:  Diagnosis Date   Allergy    GERD (gastroesophageal reflux disease)    Heart murmur    High cholesterol    Hyperlipidemia    Hypertension    Joint pain     Leg pain    Neuromuscular disorder (HCC)    Neuropathy    OSA (obstructive sleep apnea)    PVC's (premature ventricular contractions)    Sleep apnea    SOB (shortness of breath)    Squamous cell carcinoma of skin 01/16/2017   in situ- right crown    Vitamin D  deficiency      Past Surgical History:  Procedure Laterality Date   LEFT HEART CATH AND CORONARY ANGIOGRAPHY N/A 05/14/2022   Procedure: LEFT HEART CATH AND CORONARY ANGIOGRAPHY;  Surgeon: Elmira Newman PARAS, MD;  Location: [facility];  Service: Cardiovascular;  Laterality: N/A;   LUMBAR LAMINECTOMY/DECOMPRESSION MICRODISCECTOMY Left 05/18/2020   Procedure: Microlumbar decompression Lumbar Four- Lumbar Five, Lumbar Five- Sacral One left;  Surgeon: Duwayne Reyes, MD;  Location: [facility] OR;  Service: Orthopedics;  Laterality: Left;  Microlumbar decompression Lumbar Five- Sacral One left   PVC ABLATION N/A 09/11/2022   Procedure: PVC ABLATION;  Surgeon: Waddell Danelle ORN, MD;  Location: [facility];  Service: Cardiovascular;  Laterality: N/A;   ROTATOR CUFF REPAIR Right    SPINE SURGERY  05/2020   thumb surgery Left    TONSILLECTOMY     VASECTOMY      Family History  Problem Relation Age of Onset   Hypertension Mother    Hyperlipidemia Father    Neuropathy Brother    Sleep apnea Neg Hx      Social Connections: Socially Integrated (11/20/2022)   Social Connection and Isolation Panel    Frequency of Communication with Friends  and Family: More than three times a week    Frequency of Social Gatherings with Friends and Family: More than three times a week    Attends Religious Services: More than 4 times per year    Active Member of [employer] or Organizations: Yes    Attends Engineer, Structural: More than 4 times per year    Marital Status: Married     Current Outpatient Medications  Medication Instructions   acetaminophen  (TYLENOL ) 1,000 mg, Daily PRN   Alpha-Lipoic Acid 1,200 mg, Daily   azelastine   (ASTELIN ) 0.1 % nasal spray 1-2 sprays, Each Nare, 2 times daily PRN, Use in each nostril as directed   Cholecalciferol (VITAMIN D3 PO) 4,000 Units, Daily   diltiazem  (CARDIZEM  CD) 120 mg, Oral, Daily   doxycycline  (VIBRA -TABS) 100 mg, Oral, 2 times daily   gabapentin  (NEURONTIN ) 100 mg, Oral, Daily at bedtime   ibuprofen (ADVIL) 200 mg, Every 6 hours PRN   Misc Natural Products (TURMERIC CURCUMIN) CAPS Take by mouth. 1500 MG ONCE PER DAY   omeprazole  (PRILOSEC ) 20 mg, Oral, Daily   OVER THE COUNTER MEDICATION 2 capsules, Daily   rosuvastatin  (CRESTOR ) 20 mg, Oral, Every M-W-F-Su (1800)   vitamin B-12 (CYANOCOBALAMIN ) 500 mcg, Daily     Physical Exam:   BP 134/82 (BP Location: Left Arm, Patient Position: Sitting)   Pulse 66   Ht 5' 11 (1.803 m)   Wt 192 lb (87.1 kg)   SpO2 97%   BMI 26.78 kg/m   Salient findings:  CN II-XII intact   Bilateral EAC clear and TM intact with well pneumatized middle ear spaces  Anterior rhinoscopy: Septum rightward deviated; bilateral inferior turbinates with hypertrophy  No lesions of oral cavity/oropharynx  No obviously palpable neck masses/lymphadenopathy/thyromegaly  No respiratory distress or stridor Nasal endoscopy was indicated to better evaluate the nose and paranasal sinuses, given the patient's history and exam findings, and is detailed below.   Seprately Identifiable Procedures:  Prior to initiating any procedures, risks/benefits/alternatives were explained to the patient and verbal consent obtained.  PROCEDURE (11/01/2024): Bilateral Diagnostic Rigid Nasal Endoscopy Pre-procedure diagnosis: Concern for chronic sinusitis Post-procedure diagnosis: same Indication: See pre-procedure diagnosis and physical exam above Complications: None apparent EBL: 0 mL Anesthesia: Lidocaine  4% and topical decongestant was topically sprayed in each nasal cavity  Description of Procedure:  Patient was identified. A rigid 30 degree endoscope was  utilized to evaluate the sinonasal cavities, mucosa, sinus ostia and turbinates and septum.  Overall, signs of mucosal inflammation are noted.  No mucopurulence, polyps, or masses noted.   Right Middle meatus: Mucus present Right SE Recess: Clear Left MM: Mucus present Left SE Recess: Clear Photodocumentation was obtained.  CPT CODE -- 68768 - Mod 25   Impression & Plans:  Thomas Harding is a 69 y.o. male with   1. Other chronic sinusitis   2. Nasal obstruction   3. Nasal septal deviation   4. Hypertrophy of both inferior nasal turbinates     Assessment and Plan Assessment & Plan Chronic sinusitis with deviated nasal septum and inferior turbinate hypertrophy Nasal obstruction He has chronic nasal obstruction and difficulty tolerating CPAP, with intermittent improvement on decongestants and prior antibiotics. Examination demonstrated rightward septal deviation and limited airflow.  Scope exam revealed no acute infection but inflammatory mucosa with mucus in bilateral middle meatus.  Given this and his history, this suggests both structural abnormality and underlying chronic sinusitis. Surgical correction of the septal deviation may improve nasal airflow, but further  evaluation for chronic sinus disease is indicated, as sinus pathology may also require surgical intervention.  - Ordered CT scan of the sinuses to evaluate for chronic sinus disease and assess sinus anatomy. - Recommended continuation of Fluticasone  nasal spray. - Advised discontinuation of Azelastine  nasal spray as he does not have rhinorrhea and the use of both this and the Flonase  is drying. - Provided instructions for daily saline sinus rinses using distilled water and salt packets. - Follow up 3-4 weeks with CT scan    See below regarding exact medications prescribed this encounter including dosages and route: No orders of the defined types were placed in this encounter.     Thank you for allowing me the  opportunity to care for your patient. Please do not hesitate to contact me should you have any other questions.  Sincerely, Hadassah Parody, MD Otolaryngologist (ENT), [facility] Phone: [phone] Fax: [phone]  MDM:  Level 4 Complexity/Problems addressed: 4-chronic worsening problem Data complexity: 4-  independent review of referral note, 1 lab, ordered CT scan - Morbidity: -   - Prescription Drug prescribed or managed:

## 2024-11-01 NOTE — Patient Instructions (Signed)
 Aurel[facility] Med Nasal Saline Rinse   - start nasal saline rinses with NeilMed Bottle available over the counter    Nasal Saline Irrigation instructions: If you choose to make your own salt water  solution, You will need: Salt (kosher, canning, or pickling salt) Baking soda Nasal irrigation bottle (i.e. Aureliano Med Sinus Rinse) Measuring spoon ( teaspoon) Distilled / boiled water    Mix solution Mix 1 teaspoon of salt, 1/2 teaspoon of baking soda and 1 cup of water  into irrigation bottle ** May use saline packet instead of homemade recipe for this step if you prefer If medicine was prescribed to be mixed with solution, place this into bottle Examples 2 inches of 2% mupirocin ointment Budesonide solution Position your head: Lean over sink (about 45 degrees) Rotate head (about 45 degrees) so that one nostril is above the other Irrigate Insert tip of irrigation bottle into upper nostril so it forms a comfortable seal Irrigate while breathing through your mouth May remove the straw from the bottle in order to irrigate the entire solution (important if medicine was added) Exhale through nose when finished and blow nose as necessary  Repeat on opposite side with other 1/2 of solution (120 mL) or remake solution if all 240 mL was used on first side Wash irrigation bottle regularly, replace every 3 months    I have ordered an imaging study for you to complete prior to your next visit. Please call [facility] Scheduling at [phone] to schedule your imaging if you have not received a call within 24 hours. If you are unable to complete your imaging study prior to your next scheduled visit please call our office to let us  know.

## 2024-11-10 ENCOUNTER — Ambulatory Visit (INDEPENDENT_AMBULATORY_CARE_PROVIDER_SITE_OTHER): Admitting: Family Medicine

## 2024-11-10 ENCOUNTER — Encounter (INDEPENDENT_AMBULATORY_CARE_PROVIDER_SITE_OTHER): Payer: Self-pay | Admitting: Family Medicine

## 2024-11-10 VITALS — BP 130/75 | HR 66 | Temp 98.3°F | Ht 71.0 in | Wt 191.0 lb

## 2024-11-10 DIAGNOSIS — J329 Chronic sinusitis, unspecified: Secondary | ICD-10-CM | POA: Diagnosis not present

## 2024-11-10 DIAGNOSIS — E669 Obesity, unspecified: Secondary | ICD-10-CM

## 2024-11-10 DIAGNOSIS — E782 Mixed hyperlipidemia: Secondary | ICD-10-CM | POA: Diagnosis not present

## 2024-11-10 DIAGNOSIS — B9789 Other viral agents as the cause of diseases classified elsewhere: Secondary | ICD-10-CM | POA: Diagnosis not present

## 2024-11-10 DIAGNOSIS — Z6826 Body mass index (BMI) 26.0-26.9, adult: Secondary | ICD-10-CM

## 2024-11-10 MED ORDER — FLUTICASONE PROPIONATE 50 MCG/ACT NA SUSP: 2.0000 | Freq: Every day | NASAL | Status: AC

## 2024-11-10 NOTE — Progress Notes (Signed)
" ° °  SUBJECTIVE:  Chief Complaint: Obesity  Interim History: Patient had a great holiday season.  He did something for the first time in 40 years over Christmas- he did not make his fudge.  He decided he did not need to eat the fudge and knew he would eat it if he had it.  He bought a scale to be mindful of his body composition and mentions concern about his skeletal mass.  Over the next few months he doesn't have much else besides work; nothing planned yet.   Thomas Harding is here to discuss his progress with his obesity treatment plan. He is on the Category 3 Plan and states he is following his eating plan approximately 90-95 % of the time. He states he is exercising 60 minutes 5 times per week.   OBJECTIVE: Visit Diagnoses: Problem List Items Addressed This Visit   None   Vitals Temp: 98.3 F (36.8 C) BP: 130/75 Pulse Rate: 66 SpO2: 99 %   Anthropometric Measurements Height: 5' 11 (1.803 m) Weight: 191 lb (86.6 kg) BMI (Calculated): 26.65 Weight at Last Visit: 202 lb Weight Lost Since Last Visit: 11 Weight Gained Since Last Visit: 0 Starting Weight: 228 lb Total Weight Loss (lbs): 17 lb (7.711 kg)   Body Composition  Body Fat %: 29.9 % Fat Mass (lbs): 57.2 lbs Muscle Mass (lbs): 127.6 lbs Total Body Water (lbs): 96.2 lbs Visceral Fat Rating : 16   Other Clinical Data Today's Visit #: 18 Starting Date: 03/13/23 Comments: Cat 3     ASSESSMENT AND PLAN: Assessment & Plan Mixed hyperlipidemia Fasting lipid panel from November 2025 showing cholesterol is controlled.  No change in treatment plan at this time.  Defer management to treating provider. Sinusitis, unspecified chronicity, unspecified location Patient is asking for refill of his Flonase  which he is using for chronic sinusitis.  Flonase  prescription sent into pharmacy and patient encouraged to continue current treatment plan for chronic sinusitis as well as allergic rhinitis. BMI 26.0-26.9,adult  Obesity with  beginning BMI of 31.80    Diet: [name] is currently in the action stage of change. As such, his goal is to continue with weight loss efforts and has agreed to the Category 3 Plan.   Exercise:  Older adults should determine their level of effort for physical activity relative to their level of fitness.  Behavior Modification:  We discussed the following Behavioral Modification Strategies today: increasing lean protein intake, decreasing simple carbohydrates, meal planning and cooking strategies, keeping healthy foods in the home, and planning for success.  Follow-up in 6 weeks.   He was informed of the importance of frequent follow up visits to maximize his success with intensive lifestyle modifications for his multiple health conditions.  Attestation Statements:   Reviewed by clinician on day of visit: allergies, medications, problem list, medical history, surgical history, family history, social history, and previous encounter notes.   Adelita Cho, MD "

## 2024-11-15 NOTE — Assessment & Plan Note (Signed)
 Fasting lipid panel from November 2025 showing cholesterol is controlled.  No change in treatment plan at this time.  Defer management to treating provider.

## 2024-11-29 ENCOUNTER — Ambulatory Visit (INDEPENDENT_AMBULATORY_CARE_PROVIDER_SITE_OTHER)

## 2024-12-10 ENCOUNTER — Ambulatory Visit: Admitting: Cardiology

## 2025-02-03 ENCOUNTER — Ambulatory Visit (INDEPENDENT_AMBULATORY_CARE_PROVIDER_SITE_OTHER): Admitting: Family Medicine

## 2025-03-11 ENCOUNTER — Encounter: Admitting: Family Medicine
